# Patient Record
Sex: Female | Born: 1955
Health system: Southern US, Community
[De-identification: ages and names within clinical notes are randomized; demographics above are authoritative.]

## PROBLEM LIST (undated history)

## (undated) DIAGNOSIS — J449 Chronic obstructive pulmonary disease, unspecified: Secondary | ICD-10-CM

## (undated) DIAGNOSIS — F410 Panic disorder [episodic paroxysmal anxiety] without agoraphobia: Secondary | ICD-10-CM

## (undated) DIAGNOSIS — M51369 Other intervertebral disc degeneration, lumbar region without mention of lumbar back pain or lower extremity pain: Secondary | ICD-10-CM

## (undated) DIAGNOSIS — F419 Anxiety disorder, unspecified: Secondary | ICD-10-CM

## (undated) DIAGNOSIS — I10 Essential (primary) hypertension: Secondary | ICD-10-CM

## (undated) DIAGNOSIS — S129XXA Fracture of neck, unspecified, initial encounter: Secondary | ICD-10-CM

## (undated) DIAGNOSIS — M419 Scoliosis, unspecified: Secondary | ICD-10-CM

## (undated) DIAGNOSIS — M7580 Other shoulder lesions, unspecified shoulder: Secondary | ICD-10-CM

## (undated) DIAGNOSIS — M5136 Other intervertebral disc degeneration, lumbar region: Secondary | ICD-10-CM

## (undated) DIAGNOSIS — J45909 Unspecified asthma, uncomplicated: Secondary | ICD-10-CM

## (undated) HISTORY — PX: OTHER SURGICAL HISTORY: SHX169

## (undated) HISTORY — DX: Anxiety disorder, unspecified: F41.9

---

## 2008-11-03 ENCOUNTER — Emergency Department (HOSPITAL_COMMUNITY): Admission: EM | Admit: 2008-11-03 | Discharge: 2008-11-03 | Payer: Self-pay | Admitting: Emergency Medicine

## 2009-02-13 ENCOUNTER — Encounter (INDEPENDENT_AMBULATORY_CARE_PROVIDER_SITE_OTHER): Payer: Self-pay | Admitting: *Deleted

## 2009-02-22 ENCOUNTER — Ambulatory Visit: Payer: Self-pay | Admitting: Infectious Diseases

## 2009-02-22 DIAGNOSIS — R634 Abnormal weight loss: Secondary | ICD-10-CM | POA: Insufficient documentation

## 2009-02-22 DIAGNOSIS — D539 Nutritional anemia, unspecified: Secondary | ICD-10-CM | POA: Insufficient documentation

## 2009-02-22 DIAGNOSIS — R5383 Other fatigue: Secondary | ICD-10-CM | POA: Insufficient documentation

## 2009-02-22 DIAGNOSIS — R5381 Other malaise: Secondary | ICD-10-CM

## 2009-02-22 LAB — CONVERTED CEMR LAB
Basophils Absolute: 0 10*3/uL (ref 0.0–0.1)
Basophils Relative: 0 % (ref 0–1)
Eosinophils Relative: 1 % (ref 0–5)
HCT: 36.7 % (ref 36.0–46.0)
Hemoglobin: 12.4 g/dL (ref 12.0–15.0)
MCHC: 33.8 g/dL (ref 30.0–36.0)
Monocytes Absolute: 0.7 10*3/uL (ref 0.1–1.0)
Monocytes Relative: 10 % (ref 3–12)
Neutro Abs: 3.8 10*3/uL (ref 1.7–7.7)
RBC: 3.34 M/uL — ABNORMAL LOW (ref 3.87–5.11)
Retic Ct Pct: 0.8 % (ref 0.4–3.1)
WBC: 6.5 10*3/uL (ref 4.0–10.5)

## 2009-02-27 ENCOUNTER — Telehealth (INDEPENDENT_AMBULATORY_CARE_PROVIDER_SITE_OTHER): Payer: Self-pay | Admitting: *Deleted

## 2009-12-12 ENCOUNTER — Ambulatory Visit (HOSPITAL_COMMUNITY): Admission: RE | Admit: 2009-12-12 | Discharge: 2009-12-12 | Payer: Self-pay | Admitting: Internal Medicine

## 2010-04-05 ENCOUNTER — Emergency Department (HOSPITAL_COMMUNITY): Admission: EM | Admit: 2010-04-05 | Discharge: 2010-04-05 | Payer: Self-pay | Admitting: Emergency Medicine

## 2010-07-29 ENCOUNTER — Encounter: Payer: Self-pay | Admitting: Internal Medicine

## 2010-07-30 ENCOUNTER — Encounter: Payer: Self-pay | Admitting: Internal Medicine

## 2010-09-20 LAB — POCT I-STAT, CHEM 8
Calcium, Ion: 0.96 mmol/L — ABNORMAL LOW (ref 1.12–1.32)
Creatinine, Ser: 0.6 mg/dL (ref 0.4–1.2)
Hemoglobin: 15.3 g/dL — ABNORMAL HIGH (ref 12.0–15.0)
Potassium: 5.7 mEq/L — ABNORMAL HIGH (ref 3.5–5.1)
Sodium: 135 mEq/L (ref 135–145)

## 2010-09-20 LAB — URINALYSIS, ROUTINE W REFLEX MICROSCOPIC
Bilirubin Urine: NEGATIVE
Glucose, UA: NEGATIVE mg/dL
Ketones, ur: NEGATIVE mg/dL
Urobilinogen, UA: 0.2 mg/dL (ref 0.0–1.0)

## 2010-09-20 LAB — URINE MICROSCOPIC-ADD ON

## 2010-10-17 LAB — URINALYSIS, ROUTINE W REFLEX MICROSCOPIC
Bilirubin Urine: NEGATIVE
Glucose, UA: NEGATIVE mg/dL
Ketones, ur: NEGATIVE mg/dL
Urobilinogen, UA: 0.2 mg/dL (ref 0.0–1.0)

## 2010-10-17 LAB — DIFFERENTIAL
Basophils Absolute: 0 10*3/uL (ref 0.0–0.1)
Basophils Relative: 1 % (ref 0–1)
Eosinophils Absolute: 0 10*3/uL (ref 0.0–0.7)
Eosinophils Relative: 1 % (ref 0–5)
Lymphs Abs: 1.6 10*3/uL (ref 0.7–4.0)
Monocytes Absolute: 0.5 10*3/uL (ref 0.1–1.0)
Monocytes Relative: 6 % (ref 3–12)
Neutro Abs: 6.1 10*3/uL (ref 1.7–7.7)
Neutrophils Relative %: 73 % (ref 43–77)

## 2010-10-17 LAB — BASIC METABOLIC PANEL
BUN: 8 mg/dL (ref 6–23)
GFR calc non Af Amer: >60 mL/min (ref >60)
Glucose, Bld: 145 mg/dL — ABNORMAL HIGH (ref 70–99)
Potassium: 3.9 mEq/L (ref 3.5–5.1)

## 2010-10-17 LAB — MAGNESIUM: Magnesium: 2.1 mg/dL (ref 1.5–2.5)

## 2010-10-17 LAB — URINE MICROSCOPIC-ADD ON

## 2010-10-17 LAB — CBC
Hemoglobin: 13.4 g/dL (ref 12.0–15.0)
MCV: 118.8 fL — ABNORMAL HIGH (ref 78.0–100.0)
RBC: 3.32 MIL/uL — ABNORMAL LOW (ref 3.87–5.11)

## 2011-05-21 ENCOUNTER — Other Ambulatory Visit (HOSPITAL_COMMUNITY): Payer: Self-pay | Admitting: Internal Medicine

## 2011-05-21 DIAGNOSIS — Z139 Encounter for screening, unspecified: Secondary | ICD-10-CM

## 2011-08-12 DIAGNOSIS — G8929 Other chronic pain: Secondary | ICD-10-CM | POA: Diagnosis not present

## 2011-08-12 DIAGNOSIS — F411 Generalized anxiety disorder: Secondary | ICD-10-CM | POA: Diagnosis not present

## 2011-08-12 DIAGNOSIS — IMO0002 Reserved for concepts with insufficient information to code with codable children: Secondary | ICD-10-CM | POA: Diagnosis not present

## 2011-08-12 DIAGNOSIS — S93609A Unspecified sprain of unspecified foot, initial encounter: Secondary | ICD-10-CM | POA: Diagnosis not present

## 2011-09-17 DIAGNOSIS — F411 Generalized anxiety disorder: Secondary | ICD-10-CM | POA: Diagnosis not present

## 2011-09-17 DIAGNOSIS — IMO0002 Reserved for concepts with insufficient information to code with codable children: Secondary | ICD-10-CM | POA: Diagnosis not present

## 2011-09-17 DIAGNOSIS — F329 Major depressive disorder, single episode, unspecified: Secondary | ICD-10-CM | POA: Diagnosis not present

## 2011-09-17 DIAGNOSIS — G8929 Other chronic pain: Secondary | ICD-10-CM | POA: Diagnosis not present

## 2011-09-17 DIAGNOSIS — B373 Candidiasis of vulva and vagina: Secondary | ICD-10-CM | POA: Diagnosis not present

## 2011-10-08 DIAGNOSIS — F411 Generalized anxiety disorder: Secondary | ICD-10-CM | POA: Diagnosis not present

## 2011-10-08 DIAGNOSIS — F329 Major depressive disorder, single episode, unspecified: Secondary | ICD-10-CM | POA: Diagnosis not present

## 2011-10-08 DIAGNOSIS — G8929 Other chronic pain: Secondary | ICD-10-CM | POA: Diagnosis not present

## 2011-10-08 DIAGNOSIS — IMO0002 Reserved for concepts with insufficient information to code with codable children: Secondary | ICD-10-CM | POA: Diagnosis not present

## 2011-10-28 DIAGNOSIS — G8929 Other chronic pain: Secondary | ICD-10-CM | POA: Diagnosis not present

## 2011-10-28 DIAGNOSIS — F411 Generalized anxiety disorder: Secondary | ICD-10-CM | POA: Diagnosis not present

## 2011-10-28 DIAGNOSIS — F329 Major depressive disorder, single episode, unspecified: Secondary | ICD-10-CM | POA: Diagnosis not present

## 2011-10-28 DIAGNOSIS — Z681 Body mass index (BMI) 19 or less, adult: Secondary | ICD-10-CM | POA: Diagnosis not present

## 2011-11-18 DIAGNOSIS — IMO0002 Reserved for concepts with insufficient information to code with codable children: Secondary | ICD-10-CM | POA: Diagnosis not present

## 2011-11-18 DIAGNOSIS — F411 Generalized anxiety disorder: Secondary | ICD-10-CM | POA: Diagnosis not present

## 2011-11-18 DIAGNOSIS — G8929 Other chronic pain: Secondary | ICD-10-CM | POA: Diagnosis not present

## 2011-11-18 DIAGNOSIS — F329 Major depressive disorder, single episode, unspecified: Secondary | ICD-10-CM | POA: Diagnosis not present

## 2011-11-29 DIAGNOSIS — F411 Generalized anxiety disorder: Secondary | ICD-10-CM | POA: Diagnosis not present

## 2011-11-29 DIAGNOSIS — Z681 Body mass index (BMI) 19 or less, adult: Secondary | ICD-10-CM | POA: Diagnosis not present

## 2011-11-29 DIAGNOSIS — F329 Major depressive disorder, single episode, unspecified: Secondary | ICD-10-CM | POA: Diagnosis not present

## 2011-11-29 DIAGNOSIS — G8929 Other chronic pain: Secondary | ICD-10-CM | POA: Diagnosis not present

## 2011-12-10 DIAGNOSIS — F411 Generalized anxiety disorder: Secondary | ICD-10-CM | POA: Diagnosis not present

## 2011-12-10 DIAGNOSIS — G8929 Other chronic pain: Secondary | ICD-10-CM | POA: Diagnosis not present

## 2011-12-10 DIAGNOSIS — F329 Major depressive disorder, single episode, unspecified: Secondary | ICD-10-CM | POA: Diagnosis not present

## 2011-12-10 DIAGNOSIS — Z681 Body mass index (BMI) 19 or less, adult: Secondary | ICD-10-CM | POA: Diagnosis not present

## 2011-12-14 DIAGNOSIS — IMO0002 Reserved for concepts with insufficient information to code with codable children: Secondary | ICD-10-CM | POA: Diagnosis not present

## 2011-12-14 DIAGNOSIS — F411 Generalized anxiety disorder: Secondary | ICD-10-CM | POA: Diagnosis not present

## 2011-12-14 DIAGNOSIS — F329 Major depressive disorder, single episode, unspecified: Secondary | ICD-10-CM | POA: Diagnosis not present

## 2011-12-14 DIAGNOSIS — G8929 Other chronic pain: Secondary | ICD-10-CM | POA: Diagnosis not present

## 2012-01-10 DIAGNOSIS — G8929 Other chronic pain: Secondary | ICD-10-CM | POA: Diagnosis not present

## 2012-01-10 DIAGNOSIS — F411 Generalized anxiety disorder: Secondary | ICD-10-CM | POA: Diagnosis not present

## 2012-01-10 DIAGNOSIS — IMO0002 Reserved for concepts with insufficient information to code with codable children: Secondary | ICD-10-CM | POA: Diagnosis not present

## 2012-01-10 DIAGNOSIS — F329 Major depressive disorder, single episode, unspecified: Secondary | ICD-10-CM | POA: Diagnosis not present

## 2012-02-21 DIAGNOSIS — F411 Generalized anxiety disorder: Secondary | ICD-10-CM | POA: Diagnosis not present

## 2012-02-21 DIAGNOSIS — Z681 Body mass index (BMI) 19 or less, adult: Secondary | ICD-10-CM | POA: Diagnosis not present

## 2012-02-21 DIAGNOSIS — I10 Essential (primary) hypertension: Secondary | ICD-10-CM | POA: Diagnosis not present

## 2012-03-27 DIAGNOSIS — I10 Essential (primary) hypertension: Secondary | ICD-10-CM | POA: Diagnosis not present

## 2012-03-27 DIAGNOSIS — F411 Generalized anxiety disorder: Secondary | ICD-10-CM | POA: Diagnosis not present

## 2012-03-27 DIAGNOSIS — G8929 Other chronic pain: Secondary | ICD-10-CM | POA: Diagnosis not present

## 2012-05-15 DIAGNOSIS — G8929 Other chronic pain: Secondary | ICD-10-CM | POA: Diagnosis not present

## 2012-05-15 DIAGNOSIS — I10 Essential (primary) hypertension: Secondary | ICD-10-CM | POA: Diagnosis not present

## 2012-05-15 DIAGNOSIS — F411 Generalized anxiety disorder: Secondary | ICD-10-CM | POA: Diagnosis not present

## 2012-06-23 ENCOUNTER — Other Ambulatory Visit (HOSPITAL_COMMUNITY): Payer: Self-pay | Admitting: Internal Medicine

## 2012-06-23 DIAGNOSIS — I10 Essential (primary) hypertension: Secondary | ICD-10-CM | POA: Diagnosis not present

## 2012-06-23 DIAGNOSIS — IMO0002 Reserved for concepts with insufficient information to code with codable children: Secondary | ICD-10-CM | POA: Diagnosis not present

## 2012-06-23 DIAGNOSIS — F411 Generalized anxiety disorder: Secondary | ICD-10-CM | POA: Diagnosis not present

## 2012-06-23 DIAGNOSIS — Z0000 Encounter for general adult medical examination without abnormal findings: Secondary | ICD-10-CM

## 2012-06-23 DIAGNOSIS — G8929 Other chronic pain: Secondary | ICD-10-CM | POA: Diagnosis not present

## 2012-06-29 ENCOUNTER — Ambulatory Visit (HOSPITAL_COMMUNITY): Payer: Self-pay

## 2012-07-27 ENCOUNTER — Inpatient Hospital Stay (HOSPITAL_COMMUNITY): Admission: RE | Admit: 2012-07-27 | Payer: Self-pay | Source: Ambulatory Visit

## 2012-08-25 DIAGNOSIS — I10 Essential (primary) hypertension: Secondary | ICD-10-CM | POA: Diagnosis not present

## 2012-08-25 DIAGNOSIS — G8929 Other chronic pain: Secondary | ICD-10-CM | POA: Diagnosis not present

## 2012-08-25 DIAGNOSIS — F411 Generalized anxiety disorder: Secondary | ICD-10-CM | POA: Diagnosis not present

## 2012-08-25 DIAGNOSIS — IMO0002 Reserved for concepts with insufficient information to code with codable children: Secondary | ICD-10-CM | POA: Diagnosis not present

## 2012-10-19 DIAGNOSIS — F411 Generalized anxiety disorder: Secondary | ICD-10-CM | POA: Diagnosis not present

## 2012-10-19 DIAGNOSIS — G8929 Other chronic pain: Secondary | ICD-10-CM | POA: Diagnosis not present

## 2012-10-19 DIAGNOSIS — IMO0002 Reserved for concepts with insufficient information to code with codable children: Secondary | ICD-10-CM | POA: Diagnosis not present

## 2012-12-04 DIAGNOSIS — F411 Generalized anxiety disorder: Secondary | ICD-10-CM | POA: Diagnosis not present

## 2012-12-04 DIAGNOSIS — G8929 Other chronic pain: Secondary | ICD-10-CM | POA: Diagnosis not present

## 2012-12-04 DIAGNOSIS — I10 Essential (primary) hypertension: Secondary | ICD-10-CM | POA: Diagnosis not present

## 2012-12-04 DIAGNOSIS — IMO0002 Reserved for concepts with insufficient information to code with codable children: Secondary | ICD-10-CM | POA: Diagnosis not present

## 2013-01-21 DIAGNOSIS — F411 Generalized anxiety disorder: Secondary | ICD-10-CM | POA: Diagnosis not present

## 2013-01-21 DIAGNOSIS — IMO0002 Reserved for concepts with insufficient information to code with codable children: Secondary | ICD-10-CM | POA: Diagnosis not present

## 2013-01-21 DIAGNOSIS — G8929 Other chronic pain: Secondary | ICD-10-CM | POA: Diagnosis not present

## 2013-01-21 DIAGNOSIS — I10 Essential (primary) hypertension: Secondary | ICD-10-CM | POA: Diagnosis not present

## 2013-03-05 DIAGNOSIS — IMO0002 Reserved for concepts with insufficient information to code with codable children: Secondary | ICD-10-CM | POA: Diagnosis not present

## 2013-03-05 DIAGNOSIS — I10 Essential (primary) hypertension: Secondary | ICD-10-CM | POA: Diagnosis not present

## 2013-03-05 DIAGNOSIS — G8929 Other chronic pain: Secondary | ICD-10-CM | POA: Diagnosis not present

## 2013-03-05 DIAGNOSIS — K5289 Other specified noninfective gastroenteritis and colitis: Secondary | ICD-10-CM | POA: Diagnosis not present

## 2013-05-27 DIAGNOSIS — Z681 Body mass index (BMI) 19 or less, adult: Secondary | ICD-10-CM | POA: Diagnosis not present

## 2013-05-27 DIAGNOSIS — G8929 Other chronic pain: Secondary | ICD-10-CM | POA: Diagnosis not present

## 2013-05-27 DIAGNOSIS — J0100 Acute maxillary sinusitis, unspecified: Secondary | ICD-10-CM | POA: Diagnosis not present

## 2013-05-27 DIAGNOSIS — F411 Generalized anxiety disorder: Secondary | ICD-10-CM | POA: Diagnosis not present

## 2013-08-02 DIAGNOSIS — IMO0002 Reserved for concepts with insufficient information to code with codable children: Secondary | ICD-10-CM | POA: Diagnosis not present

## 2013-08-02 DIAGNOSIS — F411 Generalized anxiety disorder: Secondary | ICD-10-CM | POA: Diagnosis not present

## 2013-08-02 DIAGNOSIS — G8929 Other chronic pain: Secondary | ICD-10-CM | POA: Diagnosis not present

## 2013-10-04 DIAGNOSIS — G8929 Other chronic pain: Secondary | ICD-10-CM | POA: Diagnosis not present

## 2013-10-04 DIAGNOSIS — IMO0002 Reserved for concepts with insufficient information to code with codable children: Secondary | ICD-10-CM | POA: Diagnosis not present

## 2013-11-02 DIAGNOSIS — G8929 Other chronic pain: Secondary | ICD-10-CM | POA: Diagnosis not present

## 2013-11-02 DIAGNOSIS — IMO0002 Reserved for concepts with insufficient information to code with codable children: Secondary | ICD-10-CM | POA: Diagnosis not present

## 2013-11-26 DIAGNOSIS — G8929 Other chronic pain: Secondary | ICD-10-CM | POA: Diagnosis not present

## 2013-11-26 DIAGNOSIS — F411 Generalized anxiety disorder: Secondary | ICD-10-CM | POA: Diagnosis not present

## 2013-11-26 DIAGNOSIS — IMO0002 Reserved for concepts with insufficient information to code with codable children: Secondary | ICD-10-CM | POA: Diagnosis not present

## 2014-01-28 DIAGNOSIS — B373 Candidiasis of vulva and vagina: Secondary | ICD-10-CM | POA: Diagnosis not present

## 2014-01-28 DIAGNOSIS — IMO0002 Reserved for concepts with insufficient information to code with codable children: Secondary | ICD-10-CM | POA: Diagnosis not present

## 2014-01-28 DIAGNOSIS — F411 Generalized anxiety disorder: Secondary | ICD-10-CM | POA: Diagnosis not present

## 2014-01-28 DIAGNOSIS — G8929 Other chronic pain: Secondary | ICD-10-CM | POA: Diagnosis not present

## 2014-01-28 DIAGNOSIS — B3731 Acute candidiasis of vulva and vagina: Secondary | ICD-10-CM | POA: Diagnosis not present

## 2014-04-21 DIAGNOSIS — G894 Chronic pain syndrome: Secondary | ICD-10-CM | POA: Diagnosis not present

## 2014-04-21 DIAGNOSIS — M779 Enthesopathy, unspecified: Secondary | ICD-10-CM | POA: Diagnosis not present

## 2014-04-21 DIAGNOSIS — Z681 Body mass index (BMI) 19 or less, adult: Secondary | ICD-10-CM | POA: Diagnosis not present

## 2014-05-31 DIAGNOSIS — J019 Acute sinusitis, unspecified: Secondary | ICD-10-CM | POA: Diagnosis not present

## 2014-05-31 DIAGNOSIS — G894 Chronic pain syndrome: Secondary | ICD-10-CM | POA: Diagnosis not present

## 2014-05-31 DIAGNOSIS — M62830 Muscle spasm of back: Secondary | ICD-10-CM | POA: Diagnosis not present

## 2014-05-31 DIAGNOSIS — Z681 Body mass index (BMI) 19 or less, adult: Secondary | ICD-10-CM | POA: Diagnosis not present

## 2014-07-14 DIAGNOSIS — K121 Other forms of stomatitis: Secondary | ICD-10-CM | POA: Diagnosis not present

## 2014-07-14 DIAGNOSIS — Z681 Body mass index (BMI) 19 or less, adult: Secondary | ICD-10-CM | POA: Diagnosis not present

## 2014-07-14 DIAGNOSIS — G894 Chronic pain syndrome: Secondary | ICD-10-CM | POA: Diagnosis not present

## 2014-09-12 DIAGNOSIS — Z681 Body mass index (BMI) 19 or less, adult: Secondary | ICD-10-CM | POA: Diagnosis not present

## 2014-09-12 DIAGNOSIS — G894 Chronic pain syndrome: Secondary | ICD-10-CM | POA: Diagnosis not present

## 2014-09-12 DIAGNOSIS — F419 Anxiety disorder, unspecified: Secondary | ICD-10-CM | POA: Diagnosis not present

## 2014-11-17 DIAGNOSIS — G894 Chronic pain syndrome: Secondary | ICD-10-CM | POA: Diagnosis not present

## 2014-11-17 DIAGNOSIS — F419 Anxiety disorder, unspecified: Secondary | ICD-10-CM | POA: Diagnosis not present

## 2014-11-17 DIAGNOSIS — Z681 Body mass index (BMI) 19 or less, adult: Secondary | ICD-10-CM | POA: Diagnosis not present

## 2015-01-20 DIAGNOSIS — Z681 Body mass index (BMI) 19 or less, adult: Secondary | ICD-10-CM | POA: Diagnosis not present

## 2015-01-20 DIAGNOSIS — Z1389 Encounter for screening for other disorder: Secondary | ICD-10-CM | POA: Diagnosis not present

## 2015-01-20 DIAGNOSIS — G894 Chronic pain syndrome: Secondary | ICD-10-CM | POA: Diagnosis not present

## 2015-03-21 DIAGNOSIS — F419 Anxiety disorder, unspecified: Secondary | ICD-10-CM | POA: Diagnosis not present

## 2015-03-21 DIAGNOSIS — G894 Chronic pain syndrome: Secondary | ICD-10-CM | POA: Diagnosis not present

## 2015-03-21 DIAGNOSIS — Z681 Body mass index (BMI) 19 or less, adult: Secondary | ICD-10-CM | POA: Diagnosis not present

## 2015-03-21 DIAGNOSIS — Z1389 Encounter for screening for other disorder: Secondary | ICD-10-CM | POA: Diagnosis not present

## 2015-06-13 DIAGNOSIS — G894 Chronic pain syndrome: Secondary | ICD-10-CM | POA: Diagnosis not present

## 2015-06-13 DIAGNOSIS — Z681 Body mass index (BMI) 19 or less, adult: Secondary | ICD-10-CM | POA: Diagnosis not present

## 2015-06-13 DIAGNOSIS — Z1389 Encounter for screening for other disorder: Secondary | ICD-10-CM | POA: Diagnosis not present

## 2015-06-13 DIAGNOSIS — R634 Abnormal weight loss: Secondary | ICD-10-CM | POA: Diagnosis not present

## 2015-06-13 DIAGNOSIS — I10 Essential (primary) hypertension: Secondary | ICD-10-CM | POA: Diagnosis not present

## 2015-08-15 DIAGNOSIS — Z1389 Encounter for screening for other disorder: Secondary | ICD-10-CM | POA: Diagnosis not present

## 2015-08-15 DIAGNOSIS — Z681 Body mass index (BMI) 19 or less, adult: Secondary | ICD-10-CM | POA: Diagnosis not present

## 2015-08-15 DIAGNOSIS — I10 Essential (primary) hypertension: Secondary | ICD-10-CM | POA: Diagnosis not present

## 2015-08-15 DIAGNOSIS — G894 Chronic pain syndrome: Secondary | ICD-10-CM | POA: Diagnosis not present

## 2015-08-15 DIAGNOSIS — E063 Autoimmune thyroiditis: Secondary | ICD-10-CM | POA: Diagnosis not present

## 2015-08-15 DIAGNOSIS — F419 Anxiety disorder, unspecified: Secondary | ICD-10-CM | POA: Diagnosis not present

## 2015-10-17 DIAGNOSIS — D519 Vitamin B12 deficiency anemia, unspecified: Secondary | ICD-10-CM | POA: Diagnosis not present

## 2015-10-17 DIAGNOSIS — R5383 Other fatigue: Secondary | ICD-10-CM | POA: Diagnosis not present

## 2015-10-17 DIAGNOSIS — Z0001 Encounter for general adult medical examination with abnormal findings: Secondary | ICD-10-CM | POA: Diagnosis not present

## 2015-10-17 DIAGNOSIS — R634 Abnormal weight loss: Secondary | ICD-10-CM | POA: Diagnosis not present

## 2015-10-17 DIAGNOSIS — Z0000 Encounter for general adult medical examination without abnormal findings: Secondary | ICD-10-CM | POA: Diagnosis not present

## 2015-10-17 DIAGNOSIS — Z681 Body mass index (BMI) 19 or less, adult: Secondary | ICD-10-CM | POA: Diagnosis not present

## 2015-10-17 DIAGNOSIS — Z1389 Encounter for screening for other disorder: Secondary | ICD-10-CM | POA: Diagnosis not present

## 2015-10-17 DIAGNOSIS — G894 Chronic pain syndrome: Secondary | ICD-10-CM | POA: Diagnosis not present

## 2015-12-19 DIAGNOSIS — G894 Chronic pain syndrome: Secondary | ICD-10-CM | POA: Diagnosis not present

## 2015-12-19 DIAGNOSIS — Z1389 Encounter for screening for other disorder: Secondary | ICD-10-CM | POA: Diagnosis not present

## 2015-12-19 DIAGNOSIS — N760 Acute vaginitis: Secondary | ICD-10-CM | POA: Diagnosis not present

## 2015-12-19 DIAGNOSIS — F419 Anxiety disorder, unspecified: Secondary | ICD-10-CM | POA: Diagnosis not present

## 2015-12-19 DIAGNOSIS — Z681 Body mass index (BMI) 19 or less, adult: Secondary | ICD-10-CM | POA: Diagnosis not present

## 2016-02-19 DIAGNOSIS — G894 Chronic pain syndrome: Secondary | ICD-10-CM | POA: Diagnosis not present

## 2016-02-19 DIAGNOSIS — Z681 Body mass index (BMI) 19 or less, adult: Secondary | ICD-10-CM | POA: Diagnosis not present

## 2016-02-19 DIAGNOSIS — E441 Mild protein-calorie malnutrition: Secondary | ICD-10-CM | POA: Diagnosis not present

## 2016-02-19 DIAGNOSIS — Z1389 Encounter for screening for other disorder: Secondary | ICD-10-CM | POA: Diagnosis not present

## 2016-02-21 ENCOUNTER — Other Ambulatory Visit: Payer: Self-pay | Admitting: Internal Medicine

## 2016-02-21 DIAGNOSIS — Z1231 Encounter for screening mammogram for malignant neoplasm of breast: Secondary | ICD-10-CM

## 2016-04-23 DIAGNOSIS — G894 Chronic pain syndrome: Secondary | ICD-10-CM | POA: Diagnosis not present

## 2016-04-23 DIAGNOSIS — Z1389 Encounter for screening for other disorder: Secondary | ICD-10-CM | POA: Diagnosis not present

## 2016-04-23 DIAGNOSIS — E063 Autoimmune thyroiditis: Secondary | ICD-10-CM | POA: Diagnosis not present

## 2016-04-23 DIAGNOSIS — Z681 Body mass index (BMI) 19 or less, adult: Secondary | ICD-10-CM | POA: Diagnosis not present

## 2016-04-23 DIAGNOSIS — F419 Anxiety disorder, unspecified: Secondary | ICD-10-CM | POA: Diagnosis not present

## 2016-06-25 DIAGNOSIS — G894 Chronic pain syndrome: Secondary | ICD-10-CM | POA: Diagnosis not present

## 2016-06-25 DIAGNOSIS — Z681 Body mass index (BMI) 19 or less, adult: Secondary | ICD-10-CM | POA: Diagnosis not present

## 2016-06-25 DIAGNOSIS — E063 Autoimmune thyroiditis: Secondary | ICD-10-CM | POA: Diagnosis not present

## 2016-06-25 DIAGNOSIS — E441 Mild protein-calorie malnutrition: Secondary | ICD-10-CM | POA: Diagnosis not present

## 2016-06-25 DIAGNOSIS — F419 Anxiety disorder, unspecified: Secondary | ICD-10-CM | POA: Diagnosis not present

## 2016-06-25 DIAGNOSIS — Z1389 Encounter for screening for other disorder: Secondary | ICD-10-CM | POA: Diagnosis not present

## 2016-09-13 DIAGNOSIS — F419 Anxiety disorder, unspecified: Secondary | ICD-10-CM | POA: Diagnosis not present

## 2016-09-13 DIAGNOSIS — Z681 Body mass index (BMI) 19 or less, adult: Secondary | ICD-10-CM | POA: Diagnosis not present

## 2016-09-13 DIAGNOSIS — G894 Chronic pain syndrome: Secondary | ICD-10-CM | POA: Diagnosis not present

## 2016-11-20 DIAGNOSIS — G894 Chronic pain syndrome: Secondary | ICD-10-CM | POA: Diagnosis not present

## 2016-11-20 DIAGNOSIS — Z681 Body mass index (BMI) 19 or less, adult: Secondary | ICD-10-CM | POA: Diagnosis not present

## 2016-11-20 DIAGNOSIS — F419 Anxiety disorder, unspecified: Secondary | ICD-10-CM | POA: Diagnosis not present

## 2016-11-20 DIAGNOSIS — M47816 Spondylosis without myelopathy or radiculopathy, lumbar region: Secondary | ICD-10-CM | POA: Diagnosis not present

## 2016-11-20 DIAGNOSIS — E441 Mild protein-calorie malnutrition: Secondary | ICD-10-CM | POA: Diagnosis not present

## 2017-03-07 DIAGNOSIS — S20211A Contusion of right front wall of thorax, initial encounter: Secondary | ICD-10-CM | POA: Diagnosis not present

## 2017-03-07 DIAGNOSIS — G894 Chronic pain syndrome: Secondary | ICD-10-CM | POA: Diagnosis not present

## 2017-03-07 DIAGNOSIS — R079 Chest pain, unspecified: Secondary | ICD-10-CM | POA: Diagnosis not present

## 2017-03-07 DIAGNOSIS — Z681 Body mass index (BMI) 19 or less, adult: Secondary | ICD-10-CM | POA: Diagnosis not present

## 2017-03-07 DIAGNOSIS — R64 Cachexia: Secondary | ICD-10-CM | POA: Diagnosis not present

## 2017-05-26 DIAGNOSIS — G894 Chronic pain syndrome: Secondary | ICD-10-CM | POA: Diagnosis not present

## 2017-05-26 DIAGNOSIS — Z681 Body mass index (BMI) 19 or less, adult: Secondary | ICD-10-CM | POA: Diagnosis not present

## 2017-05-26 DIAGNOSIS — F419 Anxiety disorder, unspecified: Secondary | ICD-10-CM | POA: Diagnosis not present

## 2017-05-26 DIAGNOSIS — I10 Essential (primary) hypertension: Secondary | ICD-10-CM | POA: Diagnosis not present

## 2017-05-26 DIAGNOSIS — R64 Cachexia: Secondary | ICD-10-CM | POA: Diagnosis not present

## 2017-05-29 DIAGNOSIS — E748 Other specified disorders of carbohydrate metabolism: Secondary | ICD-10-CM | POA: Diagnosis not present

## 2017-05-29 DIAGNOSIS — R7309 Other abnormal glucose: Secondary | ICD-10-CM | POA: Diagnosis not present

## 2017-09-26 DIAGNOSIS — Z1389 Encounter for screening for other disorder: Secondary | ICD-10-CM | POA: Diagnosis not present

## 2017-09-26 DIAGNOSIS — H9192 Unspecified hearing loss, left ear: Secondary | ICD-10-CM | POA: Diagnosis not present

## 2017-09-26 DIAGNOSIS — F419 Anxiety disorder, unspecified: Secondary | ICD-10-CM | POA: Diagnosis not present

## 2017-09-26 DIAGNOSIS — Z681 Body mass index (BMI) 19 or less, adult: Secondary | ICD-10-CM | POA: Diagnosis not present

## 2017-09-26 DIAGNOSIS — G894 Chronic pain syndrome: Secondary | ICD-10-CM | POA: Diagnosis not present

## 2017-11-24 DIAGNOSIS — I10 Essential (primary) hypertension: Secondary | ICD-10-CM | POA: Diagnosis not present

## 2017-11-24 DIAGNOSIS — E441 Mild protein-calorie malnutrition: Secondary | ICD-10-CM | POA: Diagnosis not present

## 2017-11-24 DIAGNOSIS — G894 Chronic pain syndrome: Secondary | ICD-10-CM | POA: Diagnosis not present

## 2017-11-24 DIAGNOSIS — F419 Anxiety disorder, unspecified: Secondary | ICD-10-CM | POA: Diagnosis not present

## 2017-11-24 DIAGNOSIS — Z1389 Encounter for screening for other disorder: Secondary | ICD-10-CM | POA: Diagnosis not present

## 2017-11-24 DIAGNOSIS — B373 Candidiasis of vulva and vagina: Secondary | ICD-10-CM | POA: Diagnosis not present

## 2017-11-24 DIAGNOSIS — Z681 Body mass index (BMI) 19 or less, adult: Secondary | ICD-10-CM | POA: Diagnosis not present

## 2018-03-30 DIAGNOSIS — Z1389 Encounter for screening for other disorder: Secondary | ICD-10-CM | POA: Diagnosis not present

## 2018-03-30 DIAGNOSIS — G894 Chronic pain syndrome: Secondary | ICD-10-CM | POA: Diagnosis not present

## 2018-03-30 DIAGNOSIS — Z681 Body mass index (BMI) 19 or less, adult: Secondary | ICD-10-CM | POA: Diagnosis not present

## 2018-03-30 DIAGNOSIS — Z0001 Encounter for general adult medical examination with abnormal findings: Secondary | ICD-10-CM | POA: Diagnosis not present

## 2018-06-03 ENCOUNTER — Emergency Department (HOSPITAL_COMMUNITY): Payer: Medicare Other

## 2018-06-03 ENCOUNTER — Other Ambulatory Visit: Payer: Self-pay

## 2018-06-03 ENCOUNTER — Encounter (HOSPITAL_COMMUNITY): Payer: Self-pay

## 2018-06-03 ENCOUNTER — Emergency Department (HOSPITAL_COMMUNITY)
Admission: EM | Admit: 2018-06-03 | Discharge: 2018-06-03 | Disposition: A | Discharge status: Home / Self Care | Payer: Medicare Other | Attending: Emergency Medicine | Admitting: Emergency Medicine

## 2018-06-03 DIAGNOSIS — F17200 Nicotine dependence, unspecified, uncomplicated: Secondary | ICD-10-CM | POA: Insufficient documentation

## 2018-06-03 DIAGNOSIS — G894 Chronic pain syndrome: Secondary | ICD-10-CM | POA: Diagnosis not present

## 2018-06-03 DIAGNOSIS — Y999 Unspecified external cause status: Secondary | ICD-10-CM | POA: Insufficient documentation

## 2018-06-03 DIAGNOSIS — E063 Autoimmune thyroiditis: Secondary | ICD-10-CM | POA: Diagnosis not present

## 2018-06-03 DIAGNOSIS — I10 Essential (primary) hypertension: Secondary | ICD-10-CM | POA: Diagnosis not present

## 2018-06-03 DIAGNOSIS — E639 Nutritional deficiency, unspecified: Secondary | ICD-10-CM | POA: Diagnosis not present

## 2018-06-03 DIAGNOSIS — S299XXA Unspecified injury of thorax, initial encounter: Secondary | ICD-10-CM | POA: Diagnosis not present

## 2018-06-03 DIAGNOSIS — W010XXA Fall on same level from slipping, tripping and stumbling without subsequent striking against object, initial encounter: Secondary | ICD-10-CM | POA: Insufficient documentation

## 2018-06-03 DIAGNOSIS — M545 Low back pain: Secondary | ICD-10-CM | POA: Diagnosis not present

## 2018-06-03 DIAGNOSIS — S32028A Other fracture of second lumbar vertebra, initial encounter for closed fracture: Secondary | ICD-10-CM | POA: Diagnosis not present

## 2018-06-03 DIAGNOSIS — S32010A Wedge compression fracture of first lumbar vertebra, initial encounter for closed fracture: Secondary | ICD-10-CM | POA: Diagnosis not present

## 2018-06-03 DIAGNOSIS — S3992XA Unspecified injury of lower back, initial encounter: Secondary | ICD-10-CM | POA: Diagnosis present

## 2018-06-03 DIAGNOSIS — S32030A Wedge compression fracture of third lumbar vertebra, initial encounter for closed fracture: Secondary | ICD-10-CM | POA: Diagnosis not present

## 2018-06-03 DIAGNOSIS — S99911A Unspecified injury of right ankle, initial encounter: Secondary | ICD-10-CM | POA: Diagnosis not present

## 2018-06-03 DIAGNOSIS — S32038A Other fracture of third lumbar vertebra, initial encounter for closed fracture: Secondary | ICD-10-CM

## 2018-06-03 DIAGNOSIS — Y929 Unspecified place or not applicable: Secondary | ICD-10-CM | POA: Insufficient documentation

## 2018-06-03 DIAGNOSIS — Z681 Body mass index (BMI) 19 or less, adult: Secondary | ICD-10-CM | POA: Diagnosis not present

## 2018-06-03 DIAGNOSIS — Y939 Activity, unspecified: Secondary | ICD-10-CM | POA: Insufficient documentation

## 2018-06-03 DIAGNOSIS — M546 Pain in thoracic spine: Secondary | ICD-10-CM | POA: Diagnosis not present

## 2018-06-03 HISTORY — DX: Other intervertebral disc degeneration, lumbar region without mention of lumbar back pain or lower extremity pain: M51.369

## 2018-06-03 HISTORY — DX: Other shoulder lesions, unspecified shoulder: M75.80

## 2018-06-03 HISTORY — DX: Other intervertebral disc degeneration, lumbar region: M51.36

## 2018-06-03 HISTORY — DX: Scoliosis, unspecified: M41.9

## 2018-06-03 MED ORDER — HYDROCODONE-ACETAMINOPHEN 5-325 MG PO TABS
2.0000 | ORAL_TABLET | Freq: Once | ORAL | Status: AC
  Administered 2018-06-03: 2 via ORAL
  Filled 2018-06-03: qty 2

## 2018-06-03 NOTE — ED Triage Notes (Signed)
Pt reports she tripped and fell on her back a week ago. Pt reports being seen by Dr Gerarda Fraction and was given a shot and told to come to ED for evalation

## 2018-06-03 NOTE — Discharge Instructions (Addendum)
You were evaluated in the Emergency Department and after careful evaluation, we did not find any emergent condition requiring admission or further testing in the hospital.  Your symptoms today seem to be due to superior endplate fractures of L2 and L3 vertebrae.  Your injuries were discussed with neurosurgery, who would like to see you in clinic.  Please call to schedule an appointment.  Please return to the Emergency Department if you experience any worsening of your condition.  We encourage you to follow up with a primary care provider.  Thank you for allowing [place] to be a part of your care.

## 2018-06-03 NOTE — ED Provider Notes (Signed)
[facility] Emergency Department Provider Note MRN:  [id number]  Arrival date & time: 06/03/18     Chief Complaint   Back Pain   History of Present Illness   Raven Lopez is a 62 y.o. year-old female with a history of lumbar DDD, scoliosis presenting to the ED with chief complaint of back pain.  Patient explains that she tripped on her foot and stumbled 4 days ago, falling onto her lower back.  Has had significant lower back pain since that time.  Pain is 9 out of 10 in severity, constant, worse with motion or palpation.  Denies bowel or bladder dysfunction, no numbness or weakness to the arms or legs.  No head trauma, no loss of consciousness, no neck pain.  No chest pain, no abdominal pain.  Review of Systems  A complete 10 system review of systems was obtained and all systems are negative except as noted in the HPI and PMH.   Patient's Health History    Past Medical History:  Diagnosis Date  . AC (acromioclavicular) joint bone spurs   . DDD (degenerative disc disease), lumbar   . Scoliosis     History reviewed. No pertinent surgical history.  No family history on file.  Social History   Socioeconomic History  . Marital status: Married    Spouse name: Not on file  . Number of children: Not on file  . Years of education: Not on file  . Highest education level: Not on file  Occupational History  . Not on file  Social Needs  . Financial resource strain: Not on file  . Food insecurity:    Worry: Not on file    Inability: Not on file  . Transportation needs:    Medical: Not on file    Non-medical: Not on file  Tobacco Use  . Smoking status: Current Every Day Smoker    Packs/day: 0.50  . Smokeless tobacco: Never Used  Substance and Sexual Activity  . Alcohol use: Not Currently  . Drug use: Not Currently  . Sexual activity: Not on file  Lifestyle  . Physical activity:    Days per week: Not on file    Minutes per session: Not on file  . Stress:  Not on file  Relationships  . Social connections:    Talks on phone: Not on file    Gets together: Not on file    Attends religious service: Not on file    Active member of club or organization: Not on file    Attends meetings of clubs or organizations: Not on file    Relationship status: Not on file  . Intimate partner violence:    Fear of current or ex partner: Not on file    Emotionally abused: Not on file    Physically abused: Not on file    Forced sexual activity: Not on file  Other Topics Concern  . Not on file  Social History Narrative  . Not on file     Physical Exam  Vital Signs and Nursing Notes reviewed Vitals:   06/03/18 1616  BP: (!) 158/98  Pulse: 94  Resp: 16  Temp: 98.2 F (36.8 C)  SpO2: 99%    CONSTITUTIONAL: Chronically ill-appearing, frail, NAD NEURO:  Alert and oriented x 3, no focal deficits EYES:  eyes equal and reactive ENT/NECK:  no LAD, no JVD CARDIO: Regular rate, well-perfused, normal S1 and S2 PULM:  CTAB no wheezing or rhonchi GI/GU:  normal bowel  sounds, non-distended, non-tender MSK/SPINE:  No gross deformities, no edema; midline tenderness palpation to the thoracic and lumbar spine; negative straight leg test bilaterally SKIN:  no rash, atraumatic PSYCH:  Appropriate speech and behavior  Diagnostic and Interventional Summary    EKG Interpretation  Date/Time:    Ventricular Rate:    PR Interval:    QRS Duration:   QT Interval:    QTC Calculation:   R Axis:     Text Interpretation:        Labs Reviewed - No data to display  CT Lumbar Spine Wo Contrast  Final Result    CT Thoracic Spine Wo Contrast  Final Result      Medications  HYDROcodone-acetaminophen (NORCO/VICODIN) 5-325 MG per tablet 2 tablet (2 tablets Oral Given 06/03/18 1655)     Procedures Critical Care  ED Course and Medical Decision Making  I have reviewed the triage vital signs and the nursing notes.  Pertinent labs & imaging results that were  available during my care of the patient were reviewed by me and considered in my medical decision making (see below for details).  62 year old female with history of degenerative disc disease of the lumbar spine, scoliosis, likely osteopenia here 4 days after a ground-level fall, significant lower back pain.  No neurological deficits, no bowel or bladder dysfunction, nothing to suggest myelopathy.  Will evaluate with CT to exclude fracture.  CTs reveal L2 and L3 endplate fractures.  Discussed with neurosurgery, nothing to do urgently.  Patient is able to ambulate in the ED without much issue.  Patient requesting discharge.  We will follow-up with Dr. Zada Finders in clinic.  After the discussed management above, the patient was determined to be safe for discharge.  The patient was in agreement with this plan and all questions regarding their care were answered.  ED return precautions were discussed and the patient will return to the ED with any significant worsening of condition.  Barth Kirks. Sedonia Small, [place] [email or web address]  Final Clinical Impressions(s) / ED Diagnoses     ICD-10-CM   1. Other closed fracture of second lumbar vertebra, initial encounter ([place]) S32.028A   2. Other closed fracture of third lumbar vertebra, initial encounter [facility]) S81.103P     ED Discharge Orders    None         Maudie Flakes, MD 06/03/18 [place]

## 2018-06-03 NOTE — ED Notes (Signed)
Patient states she has been taking 2 - 10mg  norco at a time at home for her back pain.

## 2018-06-11 DIAGNOSIS — Z681 Body mass index (BMI) 19 or less, adult: Secondary | ICD-10-CM | POA: Diagnosis not present

## 2018-06-11 DIAGNOSIS — R2681 Unsteadiness on feet: Secondary | ICD-10-CM | POA: Diagnosis not present

## 2018-06-11 DIAGNOSIS — Z1389 Encounter for screening for other disorder: Secondary | ICD-10-CM | POA: Diagnosis not present

## 2018-06-11 DIAGNOSIS — F329 Major depressive disorder, single episode, unspecified: Secondary | ICD-10-CM | POA: Diagnosis not present

## 2018-06-11 DIAGNOSIS — F419 Anxiety disorder, unspecified: Secondary | ICD-10-CM | POA: Diagnosis not present

## 2018-06-11 DIAGNOSIS — E441 Mild protein-calorie malnutrition: Secondary | ICD-10-CM | POA: Diagnosis not present

## 2018-06-11 DIAGNOSIS — E063 Autoimmune thyroiditis: Secondary | ICD-10-CM | POA: Diagnosis not present

## 2018-08-10 DIAGNOSIS — G894 Chronic pain syndrome: Secondary | ICD-10-CM | POA: Diagnosis not present

## 2018-08-10 DIAGNOSIS — I10 Essential (primary) hypertension: Secondary | ICD-10-CM | POA: Diagnosis not present

## 2018-08-10 DIAGNOSIS — E441 Mild protein-calorie malnutrition: Secondary | ICD-10-CM | POA: Diagnosis not present

## 2018-08-10 DIAGNOSIS — I700 Atherosclerosis of aorta: Secondary | ICD-10-CM | POA: Diagnosis not present

## 2018-08-10 DIAGNOSIS — Z681 Body mass index (BMI) 19 or less, adult: Secondary | ICD-10-CM | POA: Diagnosis not present

## 2018-09-23 DIAGNOSIS — Z681 Body mass index (BMI) 19 or less, adult: Secondary | ICD-10-CM | POA: Diagnosis not present

## 2018-09-23 DIAGNOSIS — Z1389 Encounter for screening for other disorder: Secondary | ICD-10-CM | POA: Diagnosis not present

## 2018-09-23 DIAGNOSIS — G894 Chronic pain syndrome: Secondary | ICD-10-CM | POA: Diagnosis not present

## 2018-12-01 DIAGNOSIS — G4709 Other insomnia: Secondary | ICD-10-CM | POA: Diagnosis not present

## 2018-12-01 DIAGNOSIS — Z681 Body mass index (BMI) 19 or less, adult: Secondary | ICD-10-CM | POA: Diagnosis not present

## 2018-12-01 DIAGNOSIS — Z1389 Encounter for screening for other disorder: Secondary | ICD-10-CM | POA: Diagnosis not present

## 2018-12-01 DIAGNOSIS — G894 Chronic pain syndrome: Secondary | ICD-10-CM | POA: Diagnosis not present

## 2019-01-14 DIAGNOSIS — M779 Enthesopathy, unspecified: Secondary | ICD-10-CM | POA: Diagnosis not present

## 2019-01-14 DIAGNOSIS — G894 Chronic pain syndrome: Secondary | ICD-10-CM | POA: Diagnosis not present

## 2019-02-11 DIAGNOSIS — G894 Chronic pain syndrome: Secondary | ICD-10-CM | POA: Diagnosis not present

## 2019-03-12 DIAGNOSIS — G894 Chronic pain syndrome: Secondary | ICD-10-CM | POA: Diagnosis not present

## 2019-04-12 DIAGNOSIS — G894 Chronic pain syndrome: Secondary | ICD-10-CM | POA: Diagnosis not present

## 2019-05-17 DIAGNOSIS — G894 Chronic pain syndrome: Secondary | ICD-10-CM | POA: Diagnosis not present

## 2019-06-16 DIAGNOSIS — G894 Chronic pain syndrome: Secondary | ICD-10-CM | POA: Diagnosis not present

## 2019-07-16 DIAGNOSIS — G894 Chronic pain syndrome: Secondary | ICD-10-CM | POA: Diagnosis not present

## 2019-07-16 DIAGNOSIS — F419 Anxiety disorder, unspecified: Secondary | ICD-10-CM | POA: Diagnosis not present

## 2019-08-17 DIAGNOSIS — G894 Chronic pain syndrome: Secondary | ICD-10-CM | POA: Diagnosis not present

## 2019-09-06 DIAGNOSIS — G894 Chronic pain syndrome: Secondary | ICD-10-CM | POA: Diagnosis not present

## 2019-10-20 DIAGNOSIS — G894 Chronic pain syndrome: Secondary | ICD-10-CM | POA: Diagnosis not present

## 2020-06-12 DIAGNOSIS — Z6820 Body mass index (BMI) 20.0-20.9, adult: Secondary | ICD-10-CM | POA: Diagnosis not present

## 2020-06-12 DIAGNOSIS — F419 Anxiety disorder, unspecified: Secondary | ICD-10-CM | POA: Diagnosis not present

## 2020-06-12 DIAGNOSIS — Z1389 Encounter for screening for other disorder: Secondary | ICD-10-CM | POA: Diagnosis not present

## 2020-06-12 DIAGNOSIS — Z0001 Encounter for general adult medical examination with abnormal findings: Secondary | ICD-10-CM | POA: Diagnosis not present

## 2020-07-10 IMAGING — CT CT T SPINE W/O CM
3 of 4 series · 9 of 33 positions shown, 10 images · non-contrast
Comparison: Abdominopelvic CT 12/12/2009.

CLINICAL DATA: Patient tripped and fell on her back 1 week ago.
Back pain.

EXAM:
CT THORACIC AND LUMBAR SPINE WITHOUT CONTRAST
TECHNIQUE: Multidetector CT imaging of the thoracic and lumbar spine was
performed without contrast. Multiplanar CT image reconstructions
were also generated.

[Series 4: t spine soft · axial · 0.27mm/px · z∈[-172,-172]mm · 1 of 133 slices shown, 2 images]
[im 67/133  soft-tissue]
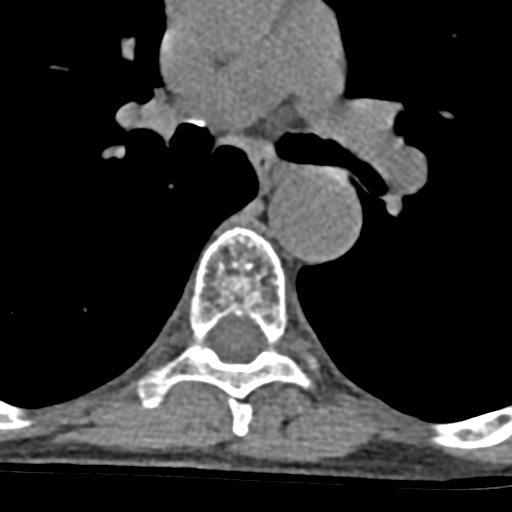
[im 67/133  bone]
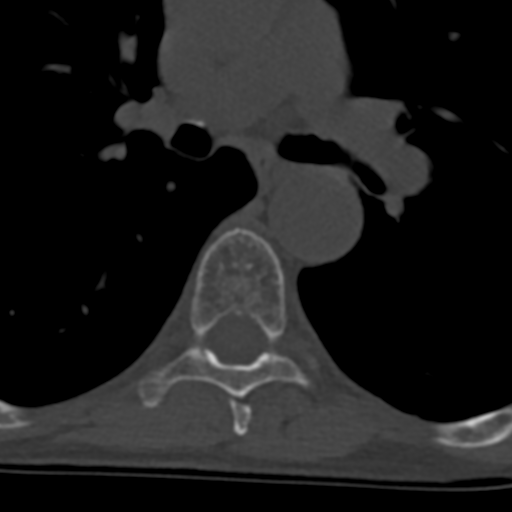

[Series 5: sagittal bone · sagittal · 0.27mm/px · 5 of 61 slices shown]
[im 21/61  bone]
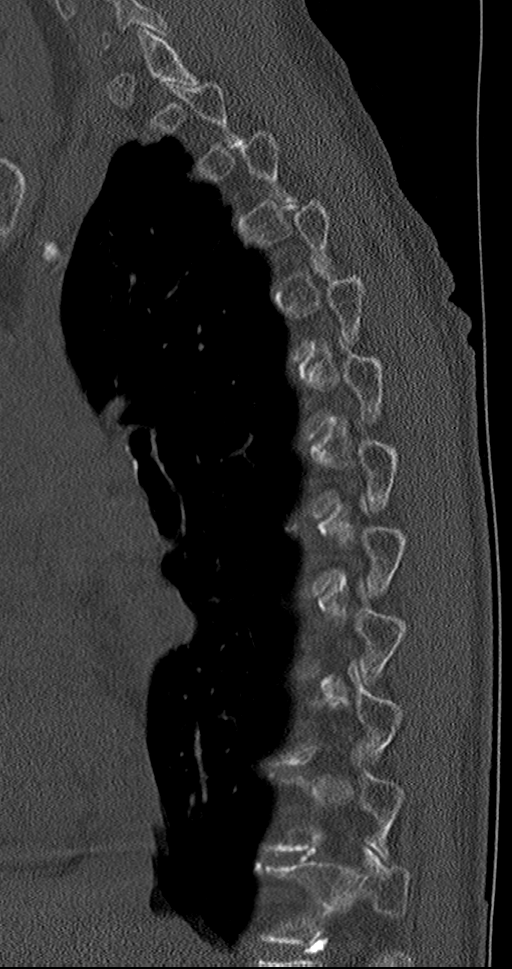
[im 26/61  bone]
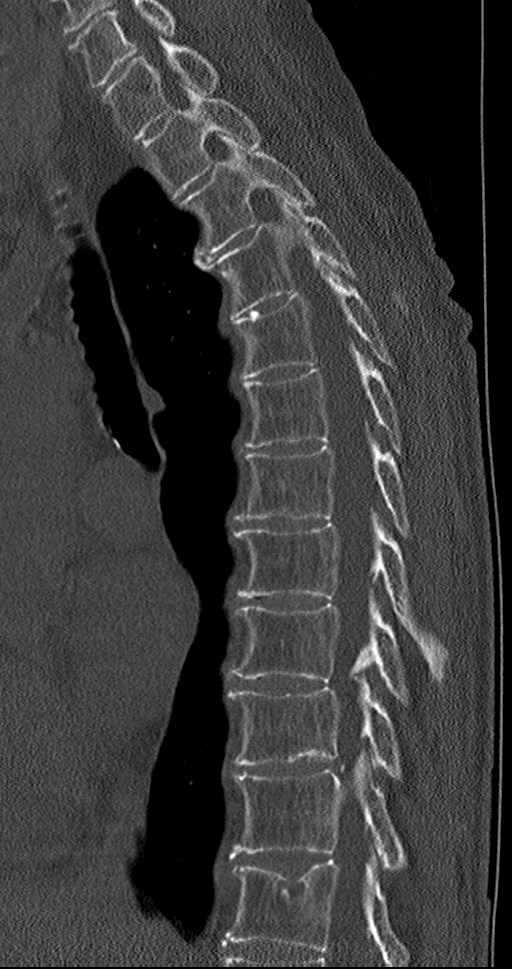
[im 31/61  bone]
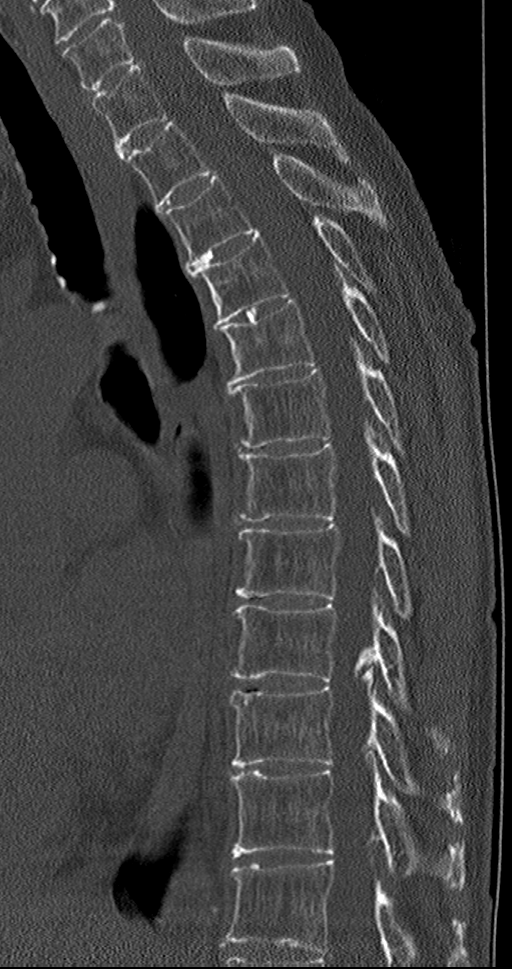
[im 36/61  bone]
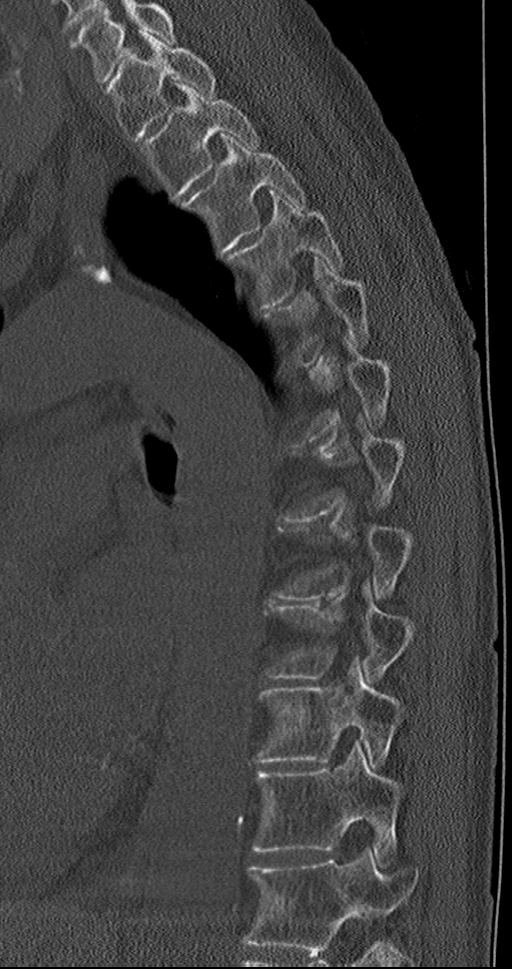
[im 41/61  bone]
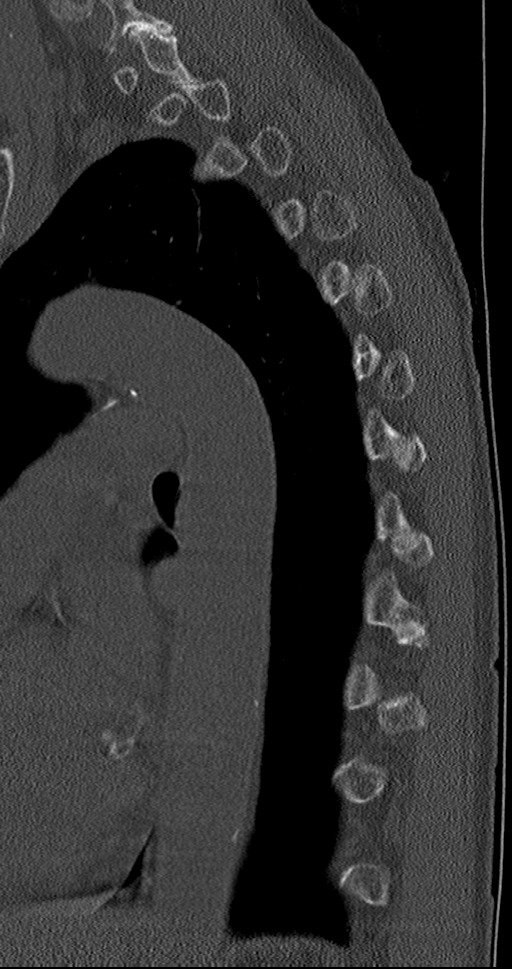

[Series 6: coronal bone · coronal · 0.22mm/px · 3 of 67 slices shown]
[im 14/67  bone]
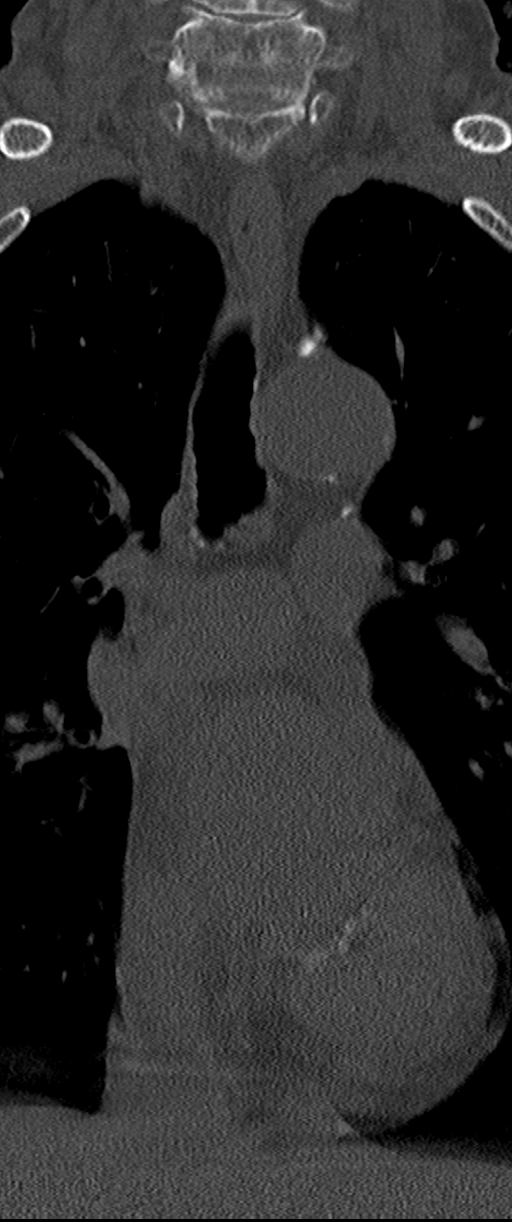
[im 27/67  bone]
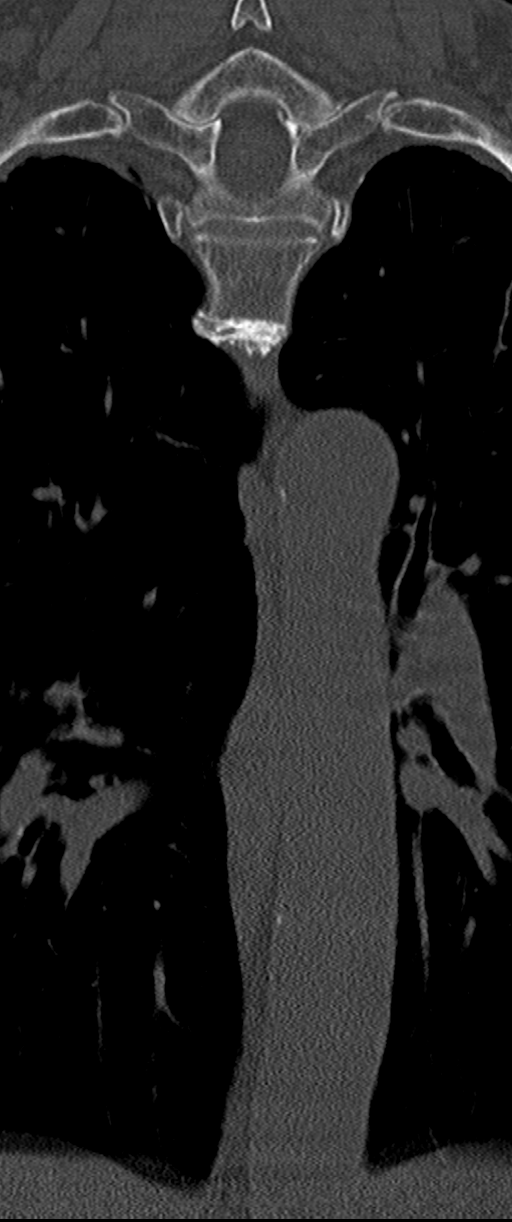
[im 40/67  bone]
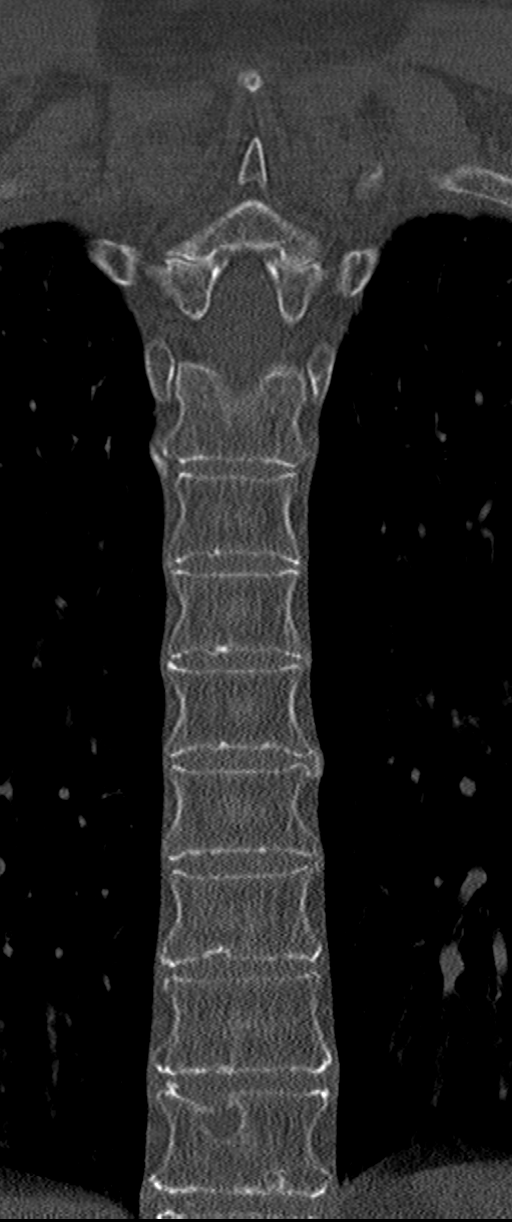

[9 of 33 positions shown; findings below may reference images not displayed]

FINDINGS: CT THORACIC SPINE FINDINGS

Alignment: Normal.

Vertebrae: There are 12 rib-bearing thoracic type vertebral bodies.
There is no evidence of acute thoracic spine fracture. There is a
Schmorl's node involving the superior endplate of T12.

Paraspinal and other soft tissues: Unremarkable. There is
atherosclerosis of the aorta, great vessels and coronary arteries.

Disc levels: Mild thoracic spine degenerative changes. There is a
partially calcified disc protrusion in the right subarticular zone
at T10-11. No large disc herniation or high-grade spinal stenosis
demonstrated.

CT LUMBAR SPINE FINDINGS

Segmentation: Normal.

Alignment: Normal.

Vertebrae: There are acute fractures involving the superior
endplates of the L2 and L3 vertebral bodies. Both fractures are
associated with less than 20% loss of vertebral body height. There
is 5 mm of osseous retropulsion at L3. The posterior elements are
intact.

Paraspinal and other soft tissues: There are mild paraspinal
inflammatory changes at the L2 and L3 fractures. No significant
hematoma or epidural fluid collection. There is prominent aortoiliac
atherosclerosis. Extrahepatic biliary dilatation is noted, similar
to previous CT. There is a small cyst in the lower pole of the right
kidney.

Disc levels: There is mild lumbar spondylosis. At L3-4, there is
mild disc bulging and facet hypertrophy, but no spinal stenosis or
nerve root encroachment. Disc height is maintained at L4-5 and
L5-S1. No nerve root encroachment identified.
IMPRESSION: CT THORACIC SPINE IMPRESSION

1. No acute findings.
2. Disc protrusion in the subarticular zone at T10-11.

CT LUMBAR SPINE IMPRESSION

1. Acute fractures involving the superior endplates of L2 and L3.
Both fractures are associated with less than 20% loss vertebral body
height and there is no involvement of the posterior elements. There
is mild osseous retropulsion at L3.
2. Mild paraspinous edema without focal hematoma.
3. No lumbar disc herniation, spinal stenosis or nerve root
encroachment.
4.  Aortic Atherosclerosis ([id number]).

## 2020-08-16 DIAGNOSIS — Z1389 Encounter for screening for other disorder: Secondary | ICD-10-CM | POA: Diagnosis not present

## 2020-08-16 DIAGNOSIS — R051 Acute cough: Secondary | ICD-10-CM | POA: Diagnosis not present

## 2020-08-16 DIAGNOSIS — Z6820 Body mass index (BMI) 20.0-20.9, adult: Secondary | ICD-10-CM | POA: Diagnosis not present

## 2021-09-10 DIAGNOSIS — Z6821 Body mass index (BMI) 21.0-21.9, adult: Secondary | ICD-10-CM | POA: Diagnosis not present

## 2021-09-10 DIAGNOSIS — I10 Essential (primary) hypertension: Secondary | ICD-10-CM | POA: Diagnosis not present

## 2021-12-14 DIAGNOSIS — E538 Deficiency of other specified B group vitamins: Secondary | ICD-10-CM | POA: Diagnosis not present

## 2021-12-14 DIAGNOSIS — I10 Essential (primary) hypertension: Secondary | ICD-10-CM | POA: Diagnosis not present

## 2021-12-14 DIAGNOSIS — Z681 Body mass index (BMI) 19 or less, adult: Secondary | ICD-10-CM | POA: Diagnosis not present

## 2021-12-14 DIAGNOSIS — Z9229 Personal history of other drug therapy: Secondary | ICD-10-CM | POA: Diagnosis not present

## 2021-12-14 DIAGNOSIS — E559 Vitamin D deficiency, unspecified: Secondary | ICD-10-CM | POA: Diagnosis not present

## 2022-02-15 ENCOUNTER — Encounter: Payer: Self-pay | Admitting: Emergency Medicine

## 2022-02-15 ENCOUNTER — Ambulatory Visit
Admission: EM | Admit: 2022-02-15 | Discharge: 2022-02-15 | Disposition: A | Discharge status: Home / Self Care | Payer: Medicare Other | Attending: Urgent Care | Admitting: Urgent Care

## 2022-02-15 DIAGNOSIS — R52 Pain, unspecified: Secondary | ICD-10-CM | POA: Diagnosis not present

## 2022-02-15 DIAGNOSIS — R319 Hematuria, unspecified: Secondary | ICD-10-CM

## 2022-02-15 DIAGNOSIS — E860 Dehydration: Secondary | ICD-10-CM | POA: Insufficient documentation

## 2022-02-15 DIAGNOSIS — R350 Frequency of micturition: Secondary | ICD-10-CM | POA: Diagnosis not present

## 2022-02-15 DIAGNOSIS — R300 Dysuria: Secondary | ICD-10-CM | POA: Diagnosis not present

## 2022-02-15 LAB — POCT URINALYSIS DIP (MANUAL ENTRY)
Bilirubin, UA: NEGATIVE
Glucose, UA: NEGATIVE mg/dL
Ketones, POC UA: NEGATIVE mg/dL
Nitrite, UA: NEGATIVE
Protein Ur, POC: >=300 mg/dL — AB
Spec Grav, UA: 1.02 (ref 1.010–1.025)
Urobilinogen, UA: 0.2 E.U./dL
pH, UA: 7 (ref 5.0–8.0)

## 2022-02-15 NOTE — ED Provider Notes (Signed)
[name]   MRN: [id number] DOB: 02-15-1956  Subjective:   Raven Lopez is a 66 y.o. female presenting for 2 week history of acute onset intermittent dysuria, urinary frequency, hematuria, body aches. Has a history of hematuria as seen in chart review.  Patient has never had to see an urologist for this.  Admits that she does not drink water.  She drinks 4 to 5 cups of coffee per day.  Does not drink alcohol.  Has hypertension but is not taking her blood pressure medications as prescribed.  She has a follow-up appointment with her PCP coming up this next week.  No current facility-administered medications for this encounter.  Current Outpatient Medications:    acetaminophen (TYLENOL) 500 MG tablet, Take 1,000 mg by mouth every 4 (four) hours as needed for mild pain or moderate pain., Disp: , Rfl:    amLODipine (NORVASC) 2.5 MG tablet, Take 2.5 mg by mouth daily., Disp: , Rfl:    diazepam (VALIUM) 10 MG tablet, Take 10 mg by mouth 4 (four) times daily as needed for anxiety., Disp: , Rfl:    HYDROcodone-acetaminophen (NORCO) 10-325 MG tablet, Take 1-2 tablets by mouth 4 (four) times daily as needed for moderate pain., Disp: , Rfl:    Allergies  Allergen Reactions   Penicillins Shortness Of Breath and Swelling    Has patient had a PCN reaction causing immediate rash, facial/tongue/throat swelling, SOB or lightheadedness with hypotension: Yes Has patient had a PCN reaction causing severe rash involving mucus membranes or skin necrosis: No Has patient had a PCN reaction that required hospitalization: Yes Has patient had a PCN reaction occurring within the last 10 years: No If all of the above answers are "NO", then may proceed with Cephalosporin use.    Aspirin     REACTION: dizzy,weak and upset stomach   Demerol [Meperidine Hcl] Other (See Comments)    Altered mental status-anger, hallucinations   Sulfonamide Derivatives Nausea And Vomiting    Past Medical History:   Diagnosis Date   AC (acromioclavicular) joint bone spurs    DDD (degenerative disc disease), lumbar    Scoliosis      History reviewed. No pertinent surgical history.  History reviewed. No pertinent family history.  Social History   Tobacco Use   Smoking status: Every Day    Packs/day: 0.50    Types: Cigarettes   Smokeless tobacco: Never  Substance Use Topics   Alcohol use: Not Currently   Drug use: Not Currently    ROS   Objective:   Vitals: BP (!) 181/83 (BP Location: Right Arm)   Pulse 79   Temp 98.6 F (37 C) (Oral)   Resp 18   SpO2 98%   Physical Exam Constitutional:      General: She is not in acute distress.    Appearance: Normal appearance. She is well-developed. She is not ill-appearing, toxic-appearing or diaphoretic.  HENT:     Head: Normocephalic and atraumatic.     Nose: Nose normal.     Mouth/Throat:     Mouth: Mucous membranes are moist.  Eyes:     General: No scleral icterus.       Right eye: No discharge.        Left eye: No discharge.     Extraocular Movements: Extraocular movements intact.     Conjunctiva/sclera: Conjunctivae normal.  Cardiovascular:     Rate and Rhythm: Normal rate.  Pulmonary:     Effort: Pulmonary effort is normal.  Abdominal:  General: Bowel sounds are normal. There is no distension.     Palpations: Abdomen is soft. There is no mass.     Tenderness: There is no abdominal tenderness. There is no right CVA tenderness, left CVA tenderness, guarding or rebound.  Skin:    General: Skin is warm and dry.  Neurological:     General: No focal deficit present.     Mental Status: She is alert and oriented to person, place, and time.  Psychiatric:        Mood and Affect: Mood normal.        Behavior: Behavior normal.        Thought Content: Thought content normal.        Judgment: Judgment normal.    Results for orders placed or performed during the hospital encounter of 02/15/22 (from the past 24 hour(s))  POCT  urinalysis dipstick     Status: Abnormal   Collection Time: 02/15/22  1:25 PM  Result Value Ref Range   Color, UA other (A) yellow   Clarity, UA clear clear   Glucose, UA negative negative mg/dL   Bilirubin, UA negative negative   Ketones, POC UA negative negative mg/dL   Spec Grav, UA 1.020 1.010 - 1.025   Blood, UA large (A) negative   pH, UA 7.0 5.0 - 8.0   Protein Ur, POC >=300 (A) negative mg/dL   Urobilinogen, UA 0.2 0.2 or 1.0 E.U./dL   Nitrite, UA Negative Negative   Leukocytes, UA Trace (A) Negative    Assessment and Plan :   PDMP not reviewed this encounter.  1. Urinary frequency   2. Dysuria   3. Hematuria, unspecified type   4. Body aches   5. Dehydration    Offered medication for dysuria but patient declined as it is very intermittent.  Recommended she start hydrating much better and avoid drinking as much coffee as she is.  I advised that she try switching to decaf.  Urine culture pending.  We will treat as appropriate based off of the lab results.  Keep follow-up appointment with the PCP.   Jaynee Eagles, PA-C 02/15/22 1338

## 2022-02-15 NOTE — Discharge Instructions (Addendum)
Make sure you hydrate very well with plain water and a quantity of 32-40 ounces of water a day.  Please limit drinks that are considered urinary irritants such as soda, sweet tea, coffee, energy drinks, alcohol.  These can worsen your urinary and genital symptoms but also be the source of them.  I will let you know about your urine culture results through MyChart to see if we need to prescribe or change your antibiotics based off of those results.   Switch to decaf coffee and limit it to no more than 16 ounces.

## 2022-02-15 NOTE — ED Triage Notes (Signed)
Patient c/o hematuria, body aches, some dysuria x 2 weeks.  Patient has taken Tylenol for pain.

## 2022-02-17 LAB — URINE CULTURE

## 2022-02-19 ENCOUNTER — Encounter (HOSPITAL_COMMUNITY): Payer: Self-pay

## 2022-02-19 ENCOUNTER — Other Ambulatory Visit: Payer: Self-pay

## 2022-02-19 ENCOUNTER — Emergency Department (HOSPITAL_COMMUNITY)
Admission: EM | Admit: 2022-02-19 | Discharge: 2022-02-19 | Disposition: A | Discharge status: Home / Self Care | Payer: Medicare Other | Attending: Emergency Medicine | Admitting: Emergency Medicine

## 2022-02-19 DIAGNOSIS — R3915 Urgency of urination: Secondary | ICD-10-CM | POA: Diagnosis not present

## 2022-02-19 DIAGNOSIS — M545 Low back pain, unspecified: Secondary | ICD-10-CM | POA: Insufficient documentation

## 2022-02-19 DIAGNOSIS — Z79899 Other long term (current) drug therapy: Secondary | ICD-10-CM | POA: Insufficient documentation

## 2022-02-19 DIAGNOSIS — R319 Hematuria, unspecified: Secondary | ICD-10-CM | POA: Diagnosis not present

## 2022-02-19 DIAGNOSIS — R944 Abnormal results of kidney function studies: Secondary | ICD-10-CM | POA: Diagnosis not present

## 2022-02-19 LAB — COMPREHENSIVE METABOLIC PANEL
ALT: 8 U/L (ref 0–44)
AST: 16 U/L (ref 15–41)
Albumin: 3.9 g/dL (ref 3.5–5.0)
Alkaline Phosphatase: 62 U/L (ref 38–126)
Anion gap: 8 (ref 5–15)
BUN: 21 mg/dL (ref 8–23)
CO2: 25 mmol/L (ref 22–32)
Calcium: 9.3 mg/dL (ref 8.9–10.3)
Chloride: 106 mmol/L (ref 98–111)
Creatinine, Ser: 1.01 mg/dL — ABNORMAL HIGH (ref 0.44–1.00)
GFR, Estimated: >60 mL/min (ref >60)
Glucose, Bld: 99 mg/dL (ref 70–99)
Potassium: 4.1 mmol/L (ref 3.5–5.1)
Sodium: 139 mmol/L (ref 135–145)
Total Bilirubin: 0.4 mg/dL (ref 0.3–1.2)
Total Protein: 7.3 g/dL (ref 6.5–8.1)

## 2022-02-19 LAB — URINALYSIS, ROUTINE W REFLEX MICROSCOPIC
Bilirubin Urine: NEGATIVE
Glucose, UA: NEGATIVE mg/dL
Ketones, ur: NEGATIVE mg/dL
Leukocytes,Ua: NEGATIVE
Nitrite: NEGATIVE
Protein, ur: 100 mg/dL — AB
RBC / HPF: >50 RBC/hpf — ABNORMAL HIGH (ref 0–5)
Specific Gravity, Urine: 1.009 (ref 1.005–1.030)
pH: 6 (ref 5.0–8.0)

## 2022-02-19 LAB — CBC
HCT: 43.5 % (ref 36.0–46.0)
Hemoglobin: 14.3 g/dL (ref 12.0–15.0)
MCH: 30.1 pg (ref 26.0–34.0)
MCHC: 32.9 g/dL (ref 30.0–36.0)
MCV: 91.6 fL (ref 80.0–100.0)
Platelets: 241 10*3/uL (ref 150–400)
RBC: 4.75 MIL/uL (ref 3.87–5.11)
RDW: 14.5 % (ref 11.5–15.5)
WBC: 7.9 10*3/uL (ref 4.0–10.5)
nRBC: 0 % (ref 0.0–0.2)

## 2022-02-19 LAB — LIPASE, BLOOD: Lipase: 29 U/L (ref 11–51)

## 2022-02-19 NOTE — Discharge Instructions (Addendum)
It was a pleasure taking care of you today!   Your labs were unremarkable. Your urine did show blood in it. Attached is the information for the on-call urologist, call and set up a follow up appointment regarding todays ED visit. Ensure to maintain fluid intake with water, pedialyte, gatorade. You may follow up with your primary care provider regarding todays ED visit. Return to the ED if you are experiencing increasing/worsening back pain, blood in urine, or worsening symptoms.

## 2022-02-19 NOTE — ED Provider Notes (Signed)
[facility] EMERGENCY DEPARTMENT Provider Note   CSN: [id number] Arrival date & time: 02/19/22  1319     History  Chief Complaint  Patient presents with   Back Pain    Raven Lopez is a 66 y.o. female who presents to the emergency department with concerns for worsening back pain over the past week.  Patient has associated intermittent hematuria over the past 2 weeks, urgency.  No meds tried prior to arrival.  Denies fever, dysuria, frequency, abdominal pain, nausea, vomiting.     The history is provided by the patient. No language interpreter was used.  Back Pain Associated symptoms: no abdominal pain, no dysuria and no fever        Home Medications Prior to Admission medications   Medication Sig Start Date End Date Taking? Authorizing Provider  acetaminophen (TYLENOL) 500 MG tablet Take 1,000 mg by mouth every 4 (four) hours as needed for mild pain or moderate pain.    [provider]  amLODipine (NORVASC) 2.5 MG tablet Take 2.5 mg by mouth daily.    [provider]  diazepam (VALIUM) 10 MG tablet Take 10 mg by mouth 4 (four) times daily as needed for anxiety.    [provider]  HYDROcodone-acetaminophen (NORCO) 10-325 MG tablet Take 1-2 tablets by mouth 4 (four) times daily as needed for moderate pain.    [provider]      Allergies    Penicillins, Aspirin, Demerol [meperidine hcl], and Sulfonamide derivatives    Review of Systems   Review of Systems  Constitutional:  Negative for fever.  Gastrointestinal:  Negative for abdominal pain, nausea and vomiting.  Genitourinary:  Positive for hematuria (x 2 weeks) and urgency. Negative for dysuria and frequency.  Musculoskeletal:  Positive for back pain.  All other systems reviewed and are negative.   Physical Exam Updated Vital Signs BP 128/89   Pulse 68   Temp 98.6 F (37 C) (Oral)   Resp 17   Ht '5\' 2"'$  (1.575 m)   SpO2 99%   BMI 14.63 kg/m  Physical Exam Vitals and  nursing note reviewed.  Constitutional:      General: She is not in acute distress.    Appearance: She is not diaphoretic.  HENT:     Head: Normocephalic and atraumatic.     Mouth/Throat:     Pharynx: No oropharyngeal exudate.  Eyes:     General: No scleral icterus.    Conjunctiva/sclera: Conjunctivae normal.  Cardiovascular:     Rate and Rhythm: Normal rate and regular rhythm.     Pulses: Normal pulses.     Heart sounds: Normal heart sounds.  Pulmonary:     Effort: Pulmonary effort is normal. No respiratory distress.     Breath sounds: Normal breath sounds. No wheezing.  Abdominal:     General: Bowel sounds are normal.     Palpations: Abdomen is soft. There is no mass.     Tenderness: There is no abdominal tenderness. There is no right CVA tenderness, left CVA tenderness, guarding or rebound.  Musculoskeletal:        General: Normal range of motion.     Cervical back: Normal range of motion and neck supple.  Skin:    General: Skin is warm and dry.  Neurological:     Mental Status: She is alert.  Psychiatric:        Behavior: Behavior normal.     ED Results / Procedures / Treatments   Labs (all  labs ordered are listed, but only abnormal results are displayed) Labs Reviewed  COMPREHENSIVE METABOLIC PANEL - Abnormal; Notable for the following components:      Result Value   Creatinine, Ser 1.01 (*)    All other components within normal limits  URINALYSIS, ROUTINE W REFLEX MICROSCOPIC - Abnormal; Notable for the following components:   Color, Urine AMBER (*)    APPearance HAZY (*)    Hgb urine dipstick MODERATE (*)    Protein, ur 100 (*)    RBC / HPF >50 (*)    Bacteria, UA RARE (*)    All other components within normal limits  CBC  LIPASE, BLOOD    EKG None  Radiology No results found.  Procedures Procedures    Medications Ordered in ED Medications - No data to display  ED Course/ Medical Decision Making/ A&P Clinical Course as of 02/19/22 2302  Tue  Feb 19, 2022  1900 Offered patient CT scan as well as vaginal exam due to patient having concerns of where the bleeding is coming from in her urine in the emergency department, patient declines at this time and notes that she wants to go home.  Discussed with patient discharge treatment plan.  Patient agreeable this time.  Patient for safe discharge at this time. [SB]    Clinical Course User Index [SB] Makynli Stills A, PA-C                           Medical Decision Making Amount and/or Complexity of Data Reviewed Labs: ordered.   Pt presents with concerns for worsening back pain over the past week.  Patient has associated intermittent hematuria over the past 2 weeks as well as urgency.  No meds tried prior to arrival.  Patient afebrile.  On exam patient without acute cardiovascular, respiratory, abdominal exam findings.  No CVA tenderness to palpation noted on exam.  Differential diagnosis includes nephrolithiasis, acute cystitis, pyelonephritis.    Additional history obtained:  Additional history obtained from family at bedside External records from outside source obtained and reviewed including: Patient was evaluated in urgent care on 02/15/2022 for urinary frequency.  At that time patient noted that she was not consuming water as she should.  At that time patient was offered medication for dysuria however patient declined at that time.  Labs:  I ordered, and personally interpreted labs.  The pertinent results include:   Urinalysis notable for a moderate amount of hemoglobin otherwise nitrate and leukocyte negative. Lipase at 29 unremarkable. CMP with slightly elevated creatinine at 1.01 otherwise unremarkable. CBC unremarkable   Disposition: Presentation suspicious for acute low back pain, hematuria as well as urgency.  Doubt concern at this time for a UTI.  Doubt pyelonephritis or nephrolithiasis at this time however unable to fully rule those out without a CT scan.  Patient offered  treatment with IV fluids as well as CT scan as well as vaginal exam due to patient have concerns of where the bleeding may be coming from.  Patient declined at this time noted that she would like to go home.  After consideration of the diagnostic results and the patients response to treatment, I feel that the patient would benefit from Discharge home. Supportive care measures and strict return precautions discussed with patient at bedside. Pt acknowledges and verbalizes understanding. Pt appears safe for discharge. Follow up as indicated in discharge paperwork.    This chart was dictated using voice recognition software, Dragon.  Despite the best efforts of this provider to proofread and correct errors, errors may still occur which can change documentation meaning.  Final Clinical Impression(s) / ED Diagnoses Final diagnoses:  Acute low back pain without sciatica, unspecified back pain laterality  Hematuria, unspecified type  Urgency of urination    Rx / DC Orders ED Discharge Orders     None         Marla Pouliot A, PA-C 02/19/22 2305    Milton Ferguson, MD 02/20/22 1345

## 2022-02-19 NOTE — ED Triage Notes (Signed)
Patient complaining of worsening back pain over the past week. Family states that she has blood in urine and urine is dark in color. Pain mostly to left side.

## 2022-02-21 ENCOUNTER — Ambulatory Visit: Payer: Medicare Other | Admitting: Adult Health

## 2022-02-21 ENCOUNTER — Other Ambulatory Visit (HOSPITAL_COMMUNITY)
Admission: RE | Admit: 2022-02-21 | Discharge: 2022-02-21 | Disposition: A | Discharge status: Home / Self Care | Payer: Medicare Other | Source: Ambulatory Visit | Attending: Adult Health | Admitting: Adult Health

## 2022-02-21 ENCOUNTER — Encounter: Payer: Self-pay | Admitting: Adult Health

## 2022-02-21 VITALS — BP 97/74 | HR 65 | Ht 62.0 in | Wt 94.0 lb

## 2022-02-21 DIAGNOSIS — Z1151 Encounter for screening for human papillomavirus (HPV): Secondary | ICD-10-CM | POA: Diagnosis not present

## 2022-02-21 DIAGNOSIS — Z124 Encounter for screening for malignant neoplasm of cervix: Secondary | ICD-10-CM | POA: Insufficient documentation

## 2022-02-21 DIAGNOSIS — N950 Postmenopausal bleeding: Secondary | ICD-10-CM | POA: Insufficient documentation

## 2022-02-21 NOTE — Progress Notes (Signed)
  Subjective:     Patient ID: Raven Lopez, female   DOB: 02-17-1956, 66 y.o.   MRN: [id number]  HPI Raven Lopez is a 66 year old white female, widowed, PM, in for vaginal bleeding started in July, is pink. Was seen in ER 02/19/22. Urine was amber and hazy she thought was dehydrated but did not get IV fluids. Had labs. Her son Raven Lopez is with her, and  Raven Lopez too.  PCP is Dr Gerarda Fraction.   Review of Systems +vaginal bleeding started in July, pink Feels weak Reviewed past medical,surgical, social and family history. Reviewed medications and allergies.     Objective:   Physical Exam BP 97/74 (BP Location: Left Arm, Patient Position: Sitting, Cuff Size: Normal)   Pulse 65   Ht '5\' 2"'$  (1.575 m)   Wt 94 lb (42.6 kg)   BMI 17.19 kg/m     Skin warm and dry, frail, in wheelchair. Lungs: clear to ausculation bilaterally. Cardiovascular: regular rate and rhythm.  Pelvic: external genitalia is normal in appearance no lesions, vagina: pale with pinkish discharge,urethra has no lesions or masses noted, cervix:atrophic, pap with HR HPV genotyping performed, uterus: normal size, shape and contour, non tender, no masses felt, adnexa: no masses or tenderness noted. Bladder is non tender and no masses felt.  AA is 1 Fall risk is low    02/21/2022    9:49 AM  Depression screen PHQ 2/9  Decreased Interest 1  Down, Depressed, Hopeless 1  PHQ - 2 Score 2  Altered sleeping 1  Tired, decreased energy 3  Change in appetite 2  Feeling bad or failure about yourself  0  Trouble concentrating 2  Moving slowly or fidgety/restless 2  Suicidal thoughts 0  PHQ-9 Score 12       02/21/2022    9:49 AM  GAD 7 : Generalized Anxiety Score  Nervous, Anxious, on Edge 2  Control/stop worrying 3  Worry too much - different things 3  Trouble relaxing 2  Restless 2  Easily annoyed or irritable 2  Afraid - awful might happen 2  Total GAD 7 Score 16  Takes valium  Upstream - 02/21/22 0959       Pregnancy Intention  Screening   Does the patient want to become pregnant in the next year? No    Does the patient's partner want to become pregnant in the next year? No    Would the patient like to discuss contraceptive options today? No      Contraception Wrap Up   Current Method Abstinence    End Method Abstinence    Contraception Counseling Provided No              Examination chaperoned by Raven Pupa LPN  Assessment:     1. Routine cervical smear Pap sent  2. PMB (postmenopausal bleeding) Bleeding pink, started in July Will get pelvic US to assess uterus and ovaries Pelvic US scheduled at [facility] 02/26/22 at 3:30 pm     Plan:    If continues to feel weak go to ER, she is drinking Pedialyte  Gave urine cup, if can bring in urine will get UA C&S Will talk when [place] results back Discussed if endometrial lining thickened will need biopsy

## 2022-02-26 ENCOUNTER — Ambulatory Visit (HOSPITAL_COMMUNITY): Payer: Medicare Other

## 2022-02-27 LAB — CYTOLOGY - PAP
Adequacy: ABSENT
Comment: NEGATIVE
Diagnosis: NEGATIVE
Diagnosis: REACTIVE
High risk HPV: NEGATIVE

## 2022-03-15 ENCOUNTER — Ambulatory Visit (HOSPITAL_COMMUNITY): Admission: RE | Admit: 2022-03-15 | Payer: Medicare Other | Source: Ambulatory Visit

## 2022-03-28 DIAGNOSIS — D518 Other vitamin B12 deficiency anemias: Secondary | ICD-10-CM | POA: Diagnosis not present

## 2022-03-28 DIAGNOSIS — E559 Vitamin D deficiency, unspecified: Secondary | ICD-10-CM | POA: Diagnosis not present

## 2022-03-28 DIAGNOSIS — E538 Deficiency of other specified B group vitamins: Secondary | ICD-10-CM | POA: Diagnosis not present

## 2022-03-28 DIAGNOSIS — E039 Hypothyroidism, unspecified: Secondary | ICD-10-CM | POA: Diagnosis not present

## 2022-03-28 DIAGNOSIS — Z9229 Personal history of other drug therapy: Secondary | ICD-10-CM | POA: Diagnosis not present

## 2022-03-28 DIAGNOSIS — R310 Gross hematuria: Secondary | ICD-10-CM | POA: Diagnosis not present

## 2022-03-28 DIAGNOSIS — I10 Essential (primary) hypertension: Secondary | ICD-10-CM | POA: Diagnosis not present

## 2022-03-28 DIAGNOSIS — Z0001 Encounter for general adult medical examination with abnormal findings: Secondary | ICD-10-CM | POA: Diagnosis not present

## 2022-03-28 DIAGNOSIS — Z681 Body mass index (BMI) 19 or less, adult: Secondary | ICD-10-CM | POA: Diagnosis not present

## 2022-03-29 ENCOUNTER — Other Ambulatory Visit: Payer: Self-pay | Admitting: Internal Medicine

## 2022-03-29 ENCOUNTER — Other Ambulatory Visit (HOSPITAL_COMMUNITY): Payer: Self-pay | Admitting: Internal Medicine

## 2022-03-29 DIAGNOSIS — R310 Gross hematuria: Secondary | ICD-10-CM

## 2022-05-07 ENCOUNTER — Ambulatory Visit (HOSPITAL_COMMUNITY): Payer: Medicare Other

## 2022-05-07 ENCOUNTER — Encounter (HOSPITAL_COMMUNITY): Payer: Self-pay

## 2022-06-19 ENCOUNTER — Encounter (HOSPITAL_COMMUNITY): Payer: Self-pay | Admitting: Radiology

## 2022-06-19 ENCOUNTER — Ambulatory Visit (HOSPITAL_COMMUNITY)
Admission: RE | Admit: 2022-06-19 | Discharge: 2022-06-19 | Disposition: A | Discharge status: Home / Self Care | Payer: Medicare Other | Source: Ambulatory Visit | Attending: Internal Medicine | Admitting: Internal Medicine

## 2022-06-19 DIAGNOSIS — K828 Other specified diseases of gallbladder: Secondary | ICD-10-CM | POA: Diagnosis not present

## 2022-06-19 DIAGNOSIS — R310 Gross hematuria: Secondary | ICD-10-CM | POA: Diagnosis not present

## 2022-06-19 DIAGNOSIS — K769 Liver disease, unspecified: Secondary | ICD-10-CM | POA: Diagnosis not present

## 2022-06-19 LAB — POCT I-STAT CREATININE: Creatinine, Ser: 1 mg/dL (ref 0.44–1.00)

## 2022-06-19 MED ORDER — IOHEXOL 300 MG/ML  SOLN
100.0000 mL | Freq: Once | INTRAMUSCULAR | Status: AC | PRN
  Administered 2022-06-19: 100 mL via INTRAVENOUS

## 2022-06-28 DIAGNOSIS — Z681 Body mass index (BMI) 19 or less, adult: Secondary | ICD-10-CM | POA: Diagnosis not present

## 2022-06-28 DIAGNOSIS — C679 Malignant neoplasm of bladder, unspecified: Secondary | ICD-10-CM | POA: Diagnosis not present

## 2022-06-28 DIAGNOSIS — I10 Essential (primary) hypertension: Secondary | ICD-10-CM | POA: Diagnosis not present

## 2022-06-28 DIAGNOSIS — R310 Gross hematuria: Secondary | ICD-10-CM | POA: Diagnosis not present

## 2022-07-29 ENCOUNTER — Ambulatory Visit: Payer: Medicare Other | Admitting: Urology

## 2022-07-29 VITALS — BP 143/82 | HR 88

## 2022-07-29 DIAGNOSIS — N3289 Other specified disorders of bladder: Secondary | ICD-10-CM

## 2022-07-29 DIAGNOSIS — C679 Malignant neoplasm of bladder, unspecified: Secondary | ICD-10-CM | POA: Diagnosis not present

## 2022-07-29 LAB — MICROSCOPIC EXAMINATION: Bacteria, UA: NONE SEEN

## 2022-07-29 LAB — URINALYSIS, ROUTINE W REFLEX MICROSCOPIC
Bilirubin, UA: NEGATIVE
Glucose, UA: NEGATIVE
Ketones, UA: NEGATIVE
Leukocytes,UA: NEGATIVE
Nitrite, UA: NEGATIVE
Specific Gravity, UA: 1.02 (ref 1.005–1.030)
Urobilinogen, Ur: 0.2 mg/dL (ref 0.2–1.0)
pH, UA: 7 (ref 5.0–7.5)

## 2022-07-29 MED ORDER — CIPROFLOXACIN HCL 500 MG PO TABS: 500.0000 mg | ORAL_TABLET | Freq: Once | ORAL | Status: DC

## 2022-07-29 NOTE — Progress Notes (Signed)
07/29/2022 2:25 PM   Raven Lopez [birth year] [id number]  Referring provider: Redmond School, MD [address] [place],  [place] [address]  Bladder mass   HPI: Raven Lopez is a [phone] here for evaluation of a bladder mass. She underwent CT in December 2023 which showed a 4-5cm bladder mass. She has a remote hx of gross hematuria. She has a 50pk year smoking hx.    PMH: Past Medical History:  Diagnosis Date   AC (acromioclavicular) joint bone spurs    Anxiety    DDD (degenerative disc disease), lumbar    Scoliosis     Surgical History: Past Surgical History:  Procedure Laterality Date   CESAREAN SECTION      Home Medications:  Allergies as of 07/29/2022       Reactions   Penicillins Shortness Of Breath, Swelling   Has patient had a PCN reaction causing immediate rash, facial/tongue/throat swelling, SOB or lightheadedness with hypotension: Yes Has patient had a PCN reaction causing severe rash involving mucus membranes or skin necrosis: No Has patient had a PCN reaction that required hospitalization: Yes Has patient had a PCN reaction occurring within the last 10 years: No If all of the above answers are "NO", then may proceed with Cephalosporin use.   Aspirin    REACTION: dizzy,weak and upset stomach   Demerol [meperidine Hcl] Other (See Comments)   Altered mental status-anger, hallucinations   Sulfonamide Derivatives Nausea And Vomiting        Medication List        Accurate as of July 29, 2022  2:25 PM. If you have any questions, ask your nurse or doctor.          acetaminophen 500 MG tablet Commonly known as: TYLENOL Take 1,000 mg by mouth every 4 (four) hours as needed for mild pain or moderate pain.   amLODipine 2.5 MG tablet Commonly known as: NORVASC Take 2.5 mg by mouth daily.   diazepam 10 MG tablet Commonly known as: VALIUM Take 10 mg by mouth 4 (four) times daily as needed for anxiety.   losartan 25 MG tablet Commonly known as:  COZAAR Take 25 mg by mouth daily.        Allergies:  Allergies  Allergen Reactions   Penicillins Shortness Of Breath and Swelling    Has patient had a PCN reaction causing immediate rash, facial/tongue/throat swelling, SOB or lightheadedness with hypotension: Yes Has patient had a PCN reaction causing severe rash involving mucus membranes or skin necrosis: No Has patient had a PCN reaction that required hospitalization: Yes Has patient had a PCN reaction occurring within the last 10 years: No If all of the above answers are "NO", then may proceed with Cephalosporin use.    Aspirin     REACTION: dizzy,weak and upset stomach   Demerol [Meperidine Hcl] Other (See Comments)    Altered mental status-anger, hallucinations   Sulfonamide Derivatives Nausea And Vomiting    Family History: Family History  Problem Relation Age of Onset   Aneurysm Father    Kidney failure Mother    Hypertension Sister    Thyroid disease Sister    Anxiety disorder Sister    Colitis Sister    Irritable bowel syndrome Sister     Social History:  reports that she has been smoking cigarettes. She has a 40.00 pack-year smoking history. She has never used smokeless tobacco. She reports current alcohol use. She reports that she does not currently use drugs.  ROS: All other  review of systems were reviewed and are negative except what is noted above in HPI  Physical Exam: BP (!) 143/82   Pulse 88   Constitutional:  Alert and oriented, No acute distress. HEENT: [place] AT, moist mucus membranes.  Trachea midline, no masses. Cardiovascular: No clubbing, cyanosis, or edema. Respiratory: Normal respiratory effort, no increased work of breathing. GI: Abdomen is soft, nontender, nondistended, no abdominal masses GU: No CVA tenderness.  Lymph: No cervical or inguinal lymphadenopathy. Skin: No rashes, bruises or suspicious lesions. Neurologic: Grossly intact, no focal deficits, moving all 4  extremities. Psychiatric: Normal mood and affect.  Laboratory Data: Lab Results  Component Value Date   WBC 7.9 02/19/2022   HGB 14.3 02/19/2022   HCT 43.5 02/19/2022   MCV 91.6 02/19/2022   PLT 241 02/19/2022    Lab Results  Component Value Date   CREATININE 1.00 06/19/2022    No results found for: "PSA"  No results found for: "TESTOSTERONE"  No results found for: "HGBA1C"  Urinalysis    Component Value Date/Time   COLORURINE AMBER (A) 02/19/2022 1730   APPEARANCEUR HAZY (A) 02/19/2022 1730   LABSPEC 1.009 02/19/2022 1730   PHURINE 6.0 02/19/2022 1730   GLUCOSEU NEGATIVE 02/19/2022 1730   HGBUR MODERATE (A) 02/19/2022 1730   BILIRUBINUR NEGATIVE 02/19/2022 1730   BILIRUBINUR negative 02/15/2022 1325   KETONESUR NEGATIVE 02/19/2022 1730   PROTEINUR 100 (A) 02/19/2022 1730   UROBILINOGEN 0.2 02/15/2022 1325   UROBILINOGEN 0.2 04/05/2010 0558   NITRITE NEGATIVE 02/19/2022 1730   LEUKOCYTESUR NEGATIVE 02/19/2022 1730    Lab Results  Component Value Date   BACTERIA RARE (A) 02/19/2022    Pertinent Imaging: CT 12/13/023:  No results found for this or any previous visit.  No results found for this or any previous visit.  No results found for this or any previous visit.  No results found for this or any previous visit.  No results found for this or any previous visit.  No valid procedures specified. No results found for this or any previous visit.  No results found for this or any previous visit.   Assessment & Plan:    1.  Bladder mass We discussed the management of bladder tumors including transurethral resection and after discussing the procedure the patient elects to proceed with surgery. Risks/benefits/alternatives to transurethral resection of the bladder tumor was explained to the patient    No follow-ups on file.  Nicolette Bang, MD  [facility] [place]   Cystoscopy Procedure Note  Patient identification was confirmed,  informed consent was obtained, and patient was prepped using Betadine solution.  Lidocaine jelly was administered per urethral meatus.    Procedure: - Flexible cystoscope introduced, without any difficulty.   - Thorough search of the bladder revealed:    normal urethral meatus    normal urothelium    no stones    no ulcers     6cm sessile posterior wall and trigone tumor    no urethral polyps    no trabeculation  - Ureteral orifices were normal in position and appearance.  Post-Procedure: - Patient tolerated the procedure well    Nicolette Bang, MD

## 2022-07-30 ENCOUNTER — Telehealth: Payer: Self-pay

## 2022-07-30 NOTE — Telephone Encounter (Signed)
I spoke with Raven Lopez while in office on 07/29/2022. We have discussed possible surgery dates and 08/29/2022 was agreed upon by all parties. Patient given information about surgery date, what to expect pre-operatively and post operatively.    We discussed that a pre-op nurse will be calling to set up the pre-op visit that will take place prior to surgery. Informed patient that our office will communicate any additional care to be provided after surgery.    Patients questions or concerns were discussed during our call. Advised to call our office should there be any additional information, questions or concerns that arise. Patient verbalized understanding.

## 2022-08-05 ENCOUNTER — Encounter: Payer: Self-pay | Admitting: Urology

## 2022-08-05 NOTE — Patient Instructions (Signed)
Transurethral Resection of Bladder Tumor  Transurethral resection of a bladder tumor is the removal (resection) of cancerous tissue (tumor) from the inside wall of the bladder. The bladder is the organ that holds urine. The tumor is removed through the tube that carries urine out of the body (urethra). In a transurethral resection, a thin telescope with a light, a tiny camera, and an electric cutting edge (resectoscope) is passed through the urethra. In men, the opening of the urethra is at the end of the penis. In women, it is just above the opening of the vagina. Tell a health care provider about: Any allergies you have. All medicines you are taking, including vitamins, herbs, eye drops, creams, and over-the-counter medicines. Any problems you or family members have had with anesthetic medicines. Any bleeding problems you have. Any surgeries you have had. Any medical conditions you have, including recent urinary tract infections. Whether you are pregnant or may be pregnant. What are the risks? Generally, this is a safe procedure. However, problems may occur, including: Infection. Bleeding. Allergic reactions to medicines. Damage to nearby structures or organs. Difficulty urinating from blockage of the urethra or not being able to urinate (urinary retention). Deep vein thrombosis. This is a blood clot that can develop in your leg. Recurring cancer. What happens before the procedure? When to stop eating and drinking Follow instructions from your health care provider about what you may eat and drink before your procedure. These may include: 8 hours before your procedure Stop eating most foods. Do not eat meat, fried foods, or fatty foods. Eat only light foods, such as toast or crackers. All liquids are okay except energy drinks and alcohol. 6 hours before your procedure Stop eating. Drink only clear liquids, such as water, clear fruit juice, black coffee, plain tea, and sports  drinks. Do not drink energy drinks or alcohol. 2 hours before your procedure Stop drinking all liquids. You may be allowed to take medicines with small sips of water. Medicines Ask your health care provider about: Changing or stopping your regular medicines. This is especially important if you are taking diabetes medicines or blood thinners. Taking medicines such as aspirin and ibuprofen. These medicines can thin your blood. Do not take these medicines unless your health care provider tells you to take them. Taking over-the-counter medicines, vitamins, herbs, and supplements. General instructions If you will be going home right after the procedure, plan to have a responsible adult: Take you home from the hospital or clinic. You will not be allowed to drive. Care for you for the time you are told. Ask your health care provider what steps will be taken to help prevent infection. These steps may include: Washing skin with a germ-killing soap. Taking antibiotic medicine. Do not use any products that contain nicotine or tobacco for at least 4 weeks before the procedure. These products include cigarettes, chewing tobacco, and vaping devices, such as e-cigarettes. If you need help quitting, ask your health care provider. What happens during the procedure? An IV will be inserted into one of your veins. You will be given one or more of the following: A medicine to help you relax (sedative). A medicine that is injected into your spine to numb the area below and slightly above the injection site (spinal anesthetic). A medicine that is injected into an area of your body to numb everything below the injection site (regional anesthetic). A medicine to make you fall asleep (general anesthetic). Your legs will be placed in foot rests (  stirrups) to open your legs and bend your knees. The resectoscope will be passed through your urethra and into your bladder. The part of your bladder with the tumor will be  resected by the cutting edge of the resectoscope. Fluid will be passed to rinse out the cut tissues (irrigation). The resectoscope will then be taken out. A small, thin tube (catheter) will be passed through your urethra and into your bladder. The catheter will drain urine into a bag outside of your body. The procedure may vary among health care providers and hospitals. What happens after the procedure? Your blood pressure, heart rate, breathing rate, and blood oxygen level will be monitored until you leave the hospital or clinic. You may continue to receive fluids and medicines through an IV. You will be given pain medicine to relieve pain. You will have a catheter to drain your urine. The amount of urine will be measured. If you have blood in your urine, your bladder may be rinsed out by passing fluid through your catheter. You will be encouraged to walk as soon as you can. You may have to wear compression stockings. These stockings help to prevent blood clots and reduce swelling in your legs. If you were given a sedative during the procedure, it can affect you for several hours. Do not drive or operate machinery until your health care provider says that it is safe. Summary Transurethral resection of a bladder tumor is the removal (resection) of a cancerous growth (tumor) on the inside wall of the bladder. To do this procedure, your health care provider uses a thin telescope with a light, a tiny camera, and an electric cutting edge (resectoscope) that is guided to your bladder through your urethra. The part of your bladder that is affected by the tumor will be resected by the cutting edge of the resectoscope. A catheter will be passed through your urethra and into your bladder. The catheter will drain urine into a bag outside of your body. If you will be going home right after the procedure, plan to have a responsible adult take you home from the hospital or clinic. You will not be allowed to  drive. This information is not intended to replace advice given to you by your health care provider. Make sure you discuss any questions you have with your health care provider. Document Revised: 06/29/2021 Document Reviewed: 06/29/2021 Else[employer] Patient Education  2023 [employer]  

## 2022-08-26 NOTE — Patient Instructions (Signed)
Raven Lopez  08/26/2022     @[email or web address]$ @   Your procedure is scheduled on 08/29/2022.  Report to [facility] at 6:30 A.M.  Call this number if you have problems the morning of surgery:  [phone]  If you experience any cold or flu symptoms such as cough, fever, chills, shortness of breath, etc. between now and your scheduled surgery, please notify us at the above number.   Remember:    Do not eat or drink after midnight.    Take these medicines the morning of surgery with A SIP OF WATER : Norvasc and Valium    Do not wear jewelry, make-up or nail polish.  Do not wear lotions, powders, or perfumes, or deodorant.  Do not shave 48 hours prior to surgery.  Men may shave face and neck.  Do not bring valuables to the hospital.  [facility] is not responsible for any belongings or valuables.  Contacts, dentures or bridgework may not be worn into surgery.  Leave your suitcase in the car.  After surgery it may be brought to your room.  For patients admitted to the hospital, discharge time will be determined by your treatment team.  Patients discharged the day of surgery will not be allowed to drive home.   Name and phone number of your driver:   family Special instructions:  n/a  Please read over the following fact sheets that you were given. Care and Recovery After Surgery   Transurethral Resection of Bladder Tumor  Transurethral resection of a bladder tumor is the removal (resection) of cancerous tissue (tumor) from the inside wall of the bladder. The bladder is the organ that holds urine. The tumor is removed through the tube that carries urine out of the body (urethra). In a transurethral resection, a thin telescope with a light, a tiny camera, and an electric cutting edge (resectoscope) is passed through the urethra. In men, the opening of the urethra is at the end of the penis. In women, it is just above the opening of the vagina. Tell a health care provider  about: Any allergies you have. All medicines you are taking, including vitamins, herbs, eye drops, creams, and over-the-counter medicines. Any problems you or family members have had with anesthetic medicines. Any bleeding problems you have. Any surgeries you have had. Any medical conditions you have, including recent urinary tract infections. Whether you are pregnant or may be pregnant. What are the risks? Generally, this is a safe procedure. However, problems may occur, including: Infection. Bleeding. Allergic reactions to medicines. Damage to nearby structures or organs. Difficulty urinating from blockage of the urethra or not being able to urinate (urinary retention). Deep vein thrombosis. This is a blood clot that can develop in your leg. Recurring cancer. What happens before the procedure? When to stop eating and drinking Follow instructions from your health care provider about what you may eat and drink before your procedure. These may include: 8 hours before your procedure Stop eating most foods. Do not eat meat, fried foods, or fatty foods. Eat only light foods, such as toast or crackers. All liquids are okay except energy drinks and alcohol. 6 hours before your procedure Stop eating. Drink only clear liquids, such as water, clear fruit juice, black coffee, plain tea, and sports drinks. Do not drink energy drinks or alcohol. 2 hours before your procedure Stop drinking all liquids. You may be allowed to take medicines with small sips of water. Medicines Ask your health care provider  about: Changing or stopping your regular medicines. This is especially important if you are taking diabetes medicines or blood thinners. Taking medicines such as aspirin and ibuprofen. These medicines can thin your blood. Do not take these medicines unless your health care provider tells you to take them. Taking over-the-counter medicines, vitamins, herbs, and supplements. General  instructions If you will be going home right after the procedure, plan to have a responsible adult: Take you home from the hospital or clinic. You will not be allowed to drive. Care for you for the time you are told. Ask your health care provider what steps will be taken to help prevent infection. These steps may include: Washing skin with a germ-killing soap. Taking antibiotic medicine. Do not use any products that contain nicotine or tobacco for at least 4 weeks before the procedure. These products include cigarettes, chewing tobacco, and vaping devices, such as e-cigarettes. If you need help quitting, ask your health care provider. What happens during the procedure? An IV will be inserted into one of your veins. You will be given one or more of the following: A medicine to help you relax (sedative). A medicine that is injected into your spine to numb the area below and slightly above the injection site (spinal anesthetic). A medicine that is injected into an area of your body to numb everything below the injection site (regional anesthetic). A medicine to make you fall asleep (general anesthetic). Your legs will be placed in foot rests (stirrups) to open your legs and bend your knees. The resectoscope will be passed through your urethra and into your bladder. The part of your bladder with the tumor will be resected by the cutting edge of the resectoscope. Fluid will be passed to rinse out the cut tissues (irrigation). The resectoscope will then be taken out. A small, thin tube (catheter) will be passed through your urethra and into your bladder. The catheter will drain urine into a bag outside of your body. The procedure may vary among health care providers and hospitals. What happens after the procedure? Your blood pressure, heart rate, breathing rate, and blood oxygen level will be monitored until you leave the hospital or clinic. You may continue to receive fluids and medicines through  an IV. You will be given pain medicine to relieve pain. You will have a catheter to drain your urine. The amount of urine will be measured. If you have blood in your urine, your bladder may be rinsed out by passing fluid through your catheter. You will be encouraged to walk as soon as you can. You may have to wear compression stockings. These stockings help to prevent blood clots and reduce swelling in your legs. If you were given a sedative during the procedure, it can affect you for several hours. Do not drive or operate machinery until your health care provider says that it is safe. Summary Transurethral resection of a bladder tumor is the removal (resection) of a cancerous growth (tumor) on the inside wall of the bladder. To do this procedure, your health care provider uses a thin telescope with a light, a tiny camera, and an electric cutting edge (resectoscope) that is guided to your bladder through your urethra. The part of your bladder that is affected by the tumor will be resected by the cutting edge of the resectoscope. A catheter will be passed through your urethra and into your bladder. The catheter will drain urine into a bag outside of your body. If you will be going  home right after the procedure, plan to have a responsible adult take you home from the hospital or clinic. You will not be allowed to drive. This information is not intended to replace advice given to you by your health care provider. Make sure you discuss any questions you have with your health care provider. Document Revised: 06/29/2021 Document Reviewed: 06/29/2021 Elsevier Patient Education  [place] Anesthesia, Adult General anesthesia is the use of medicine to make you fall asleep (unconscious) for a medical procedure. General anesthesia must be used for certain procedures. It is often recommended for surgery or procedures that: Last a long time. Require you to be still or in an unusual  position. Are major and can cause blood loss. Affect your breathing. The medicines used for general anesthesia are called general anesthetics. During general anesthesia, these medicines are given along with medicines that: Prevent pain. Control your blood pressure. Relax your muscles. Prevent nausea and vomiting after the procedure. Tell a health care provider about: Any allergies you have. All medicines you are taking, including vitamins, herbs, eye drops, creams, and over-the-counter medicines. Your history of any: Medical conditions you have, including: High blood pressure. Bleeding problems. Diabetes. Heart or lung conditions, such as: Heart failure. Sleep apnea. Asthma. Chronic obstructive pulmonary disease (COPD). Current or recent illnesses, such as: Upper respiratory, chest, or ear infections. Cough or fever. Tobacco or drug use, including marijuana or alcohol use. Depression or anxiety. Surgeries and types of anesthetics you have had. Problems you or family members have had with anesthetic medicines. Whether you are pregnant or may be pregnant. Whether you have any chipped or loose teeth, dentures, caps, bridgework, or issues with your mouth, swallowing, or choking. What are the risks? Your health care provider will talk with you about risks. These may include: Allergic reaction to the medicines. Lung and heart problems. Inhaling food or liquid from the stomach into the lungs (aspiration). Nerve injury. Injury to the lips, mouth, teeth, or gums. Stroke. Waking up during your procedure and being unable to move. This is rare. These problems are more likely to develop if you are having a major surgery or if you have an advanced or serious medical condition. You can prevent some of these complications by answering all of your health care provider's questions thoroughly and by following all instructions before your procedure. General anesthesia can cause side effects,  including: Nausea or vomiting. A sore throat or hoarseness from the breathing tube. Wheezing or coughing. Shaking chills or feeling cold. Body aches. Sleepiness. Confusion, agitation (delirium), or anxiety. What happens before the procedure? When to stop eating and drinking Follow instructions from your health care provider about what you may eat and drink before your procedure. If you do not follow your health care provider's instructions, your procedure may be delayed or canceled. Medicines Ask your health care provider about: Changing or stopping your regular medicines. These include any diabetes medicines or blood thinners you take. Taking medicines such as aspirin and ibuprofen. These medicines can thin your blood. Do not take them unless your health care provider tells you to. Taking over-the-counter medicines, vitamins, herbs, and supplements. General instructions Do not use any products that contain nicotine or tobacco for at least 4 weeks before the procedure. These products include cigarettes, chewing tobacco, and vaping devices, such as e-cigarettes. If you need help quitting, ask your health care provider. If you brush your teeth on the morning of the procedure, make sure to spit out all  of the water and toothpaste. If told by your health care provider, bring your sleep apnea device with you to surgery (if applicable). If you will be going home right after the procedure, plan to have a responsible adult: Take you home from the hospital or clinic. You will not be allowed to drive. Care for you for the time you are told. What happens during the procedure?  An IV will be inserted into one of your veins. You will be given one or more of the following through a face mask or IV: A sedative. This helps you relax. Anesthesia. This will: Numb certain areas of your body. Make you fall asleep for surgery. After you are unconscious, a breathing tube may be inserted down your throat to  help you breathe. This will be removed before you wake up. An anesthesia provider, such as an anesthesiologist, will stay with you throughout your procedure. The anesthesia provider will: Keep you comfortable and safe by continuing to give you medicines and adjusting the amount of medicine that you get. Monitor your blood pressure, heart rate, and oxygen levels to make sure that the anesthetics do not cause any problems. The procedure may vary among health care providers and hospitals. What happens after the procedure? Your blood pressure, temperature, heart rate, breathing rate, and blood oxygen level will be monitored until you leave the hospital or clinic. You will wake up in a recovery area. You may wake up slowly. You may be given medicine to help you with pain, nausea, or any other side effects from the anesthesia. Summary General anesthesia is the use of medicine to make you fall asleep (unconscious) for a medical procedure. Follow your health care provider's instructions about when to stop eating, drinking, or taking certain medicines before your procedure. Plan to have a responsible adult take you home from the hospital or clinic. This information is not intended to replace advice given to you by your health care provider. Make sure you discuss any questions you have with your health care provider. Document Revised: 09/20/2021 Document Reviewed: 09/20/2021 Elsevier Patient Education  [place].  How to Use Chlorhexidine Before Surgery Chlorhexidine gluconate (CHG) is a germ-killing (antiseptic) solution that is used to clean the skin. It can get rid of the bacteria that normally live on the skin and can keep them away for about 24 hours. To clean your skin with CHG, you may be given: A CHG solution to use in the shower or as part of a sponge bath. A prepackaged cloth that contains CHG. Cleaning your skin with CHG may help lower the risk for infection: While you are staying in  the intensive care unit of the hospital. If you have a vascular access, such as a central line, to provide short-term or long-term access to your veins. If you have a catheter to drain urine from your bladder. If you are on a ventilator. A ventilator is a machine that helps you breathe by moving air in and out of your lungs. After surgery. What are the risks? Risks of using CHG include: A skin reaction. Hearing loss, if CHG gets in your ears and you have a perforated eardrum. Eye injury, if CHG gets in your eyes and is not rinsed out. The CHG product catching fire. Make sure that you avoid smoking and flames after applying CHG to your skin. Do not use CHG: If you have a chlorhexidine allergy or have previously reacted to chlorhexidine. On babies younger than 2 months of  age. How to use CHG solution Use CHG only as told by your health care provider, and follow the instructions on the label. Use the full amount of CHG as directed. Usually, this is one bottle. During a shower Follow these steps when using CHG solution during a shower (unless your health care provider gives you different instructions): Start the shower. Use your normal soap and shampoo to wash your face and hair. Turn off the shower or move out of the shower stream. Pour the CHG onto a clean washcloth. Do not use any type of brush or rough-edged sponge. Starting at your neck, lather your body down to your toes. Make sure you follow these instructions: If you will be having surgery, pay special attention to the part of your body where you will be having surgery. Scrub this area for at least 1 minute. Do not use CHG on your head or face. If the solution gets into your ears or eyes, rinse them well with water. Avoid your genital area. Avoid any areas of skin that have broken skin, cuts, or scrapes. Scrub your back and under your arms. Make sure to wash skin folds. Let the lather sit on your skin for 1-2 minutes or as long as  told by your health care provider. Thoroughly rinse your entire body in the shower. Make sure that all body creases and crevices are rinsed well. Dry off with a clean towel. Do not put any substances on your body afterward--such as powder, lotion, or perfume--unless you are told to do so by your health care provider. Only use lotions that are recommended by the manufacturer. Put on clean clothes or pajamas. If it is the night before your surgery, sleep in clean sheets.  During a sponge bath Follow these steps when using CHG solution during a sponge bath (unless your health care provider gives you different instructions): Use your normal soap and shampoo to wash your face and hair. Pour the CHG onto a clean washcloth. Starting at your neck, lather your body down to your toes. Make sure you follow these instructions: If you will be having surgery, pay special attention to the part of your body where you will be having surgery. Scrub this area for at least 1 minute. Do not use CHG on your head or face. If the solution gets into your ears or eyes, rinse them well with water. Avoid your genital area. Avoid any areas of skin that have broken skin, cuts, or scrapes. Scrub your back and under your arms. Make sure to wash skin folds. Let the lather sit on your skin for 1-2 minutes or as long as told by your health care provider. Using a different clean, wet washcloth, thoroughly rinse your entire body. Make sure that all body creases and crevices are rinsed well. Dry off with a clean towel. Do not put any substances on your body afterward--such as powder, lotion, or perfume--unless you are told to do so by your health care provider. Only use lotions that are recommended by the manufacturer. Put on clean clothes or pajamas. If it is the night before your surgery, sleep in clean sheets. How to use CHG prepackaged cloths Only use CHG cloths as told by your health care provider, and follow the instructions  on the label. Use the CHG cloth on clean, dry skin. Do not use the CHG cloth on your head or face unless your health care provider tells you to. When washing with the CHG cloth: Avoid your genital  area. Avoid any areas of skin that have broken skin, cuts, or scrapes. Before surgery Follow these steps when using a CHG cloth to clean before surgery (unless your health care provider gives you different instructions): Using the CHG cloth, vigorously scrub the part of your body where you will be having surgery. Scrub using a back-and-forth motion for 3 minutes. The area on your body should be completely wet with CHG when you are done scrubbing. Do not rinse. Discard the cloth and let the area air-dry. Do not put any substances on the area afterward, such as powder, lotion, or perfume. Put on clean clothes or pajamas. If it is the night before your surgery, sleep in clean sheets.  For general bathing Follow these steps when using CHG cloths for general bathing (unless your health care provider gives you different instructions). Use a separate CHG cloth for each area of your body. Make sure you wash between any folds of skin and between your fingers and toes. Wash your body in the following order, switching to a new cloth after each step: The front of your neck, shoulders, and chest. Both of your arms, under your arms, and your hands. Your stomach and groin area, avoiding the genitals. Your right leg and foot. Your left leg and foot. The back of your neck, your back, and your buttocks. Do not rinse. Discard the cloth and let the area air-dry. Do not put any substances on your body afterward--such as powder, lotion, or perfume--unless you are told to do so by your health care provider. Only use lotions that are recommended by the manufacturer. Put on clean clothes or pajamas. Contact a health care provider if: Your skin gets irritated after scrubbing. You have questions about using your solution or  cloth. You swallow any chlorhexidine. Call your local poison control center (1-(904)754-4731 in the [place] Get help right away if: Your eyes itch badly, or they become very red or swollen. Your skin itches badly and is red or swollen. Your hearing changes. You have trouble seeing. You have swelling or tingling in your mouth or throat. You have trouble breathing. These symptoms may represent a serious problem that is an emergency. Do not wait to see if the symptoms will go away. Get medical help right away. Call your local emergency services (911 in the [place] Do not drive yourself to the hospital. Summary Chlorhexidine gluconate (CHG) is a germ-killing (antiseptic) solution that is used to clean the skin. Cleaning your skin with CHG may help to lower your risk for infection. You may be given CHG to use for bathing. It may be in a bottle or in a prepackaged cloth to use on your skin. Carefully follow your health care provider's instructions and the instructions on the product label. Do not use CHG if you have a chlorhexidine allergy. Contact your health care provider if your skin gets irritated after scrubbing. This information is not intended to replace advice given to you by your health care provider. Make sure you discuss any questions you have with your health care provider. Document Revised: 10/22/2021 Document Reviewed: 09/04/2020 Elsevier Patient Education  [place].

## 2022-08-27 ENCOUNTER — Encounter (HOSPITAL_COMMUNITY)
Admission: RE | Admit: 2022-08-27 | Discharge: 2022-08-27 | Disposition: A | Discharge status: Home / Self Care | Payer: Medicare Other | Source: Ambulatory Visit | Attending: Urology | Admitting: Urology

## 2022-08-27 ENCOUNTER — Encounter (HOSPITAL_COMMUNITY): Payer: Self-pay

## 2022-08-27 DIAGNOSIS — Z01818 Encounter for other preprocedural examination: Secondary | ICD-10-CM

## 2022-08-27 DIAGNOSIS — I10 Essential (primary) hypertension: Secondary | ICD-10-CM | POA: Insufficient documentation

## 2022-08-27 HISTORY — DX: Chronic obstructive pulmonary disease, unspecified: J44.9

## 2022-08-27 HISTORY — DX: Panic disorder (episodic paroxysmal anxiety): F41.0

## 2022-08-27 HISTORY — DX: Unspecified asthma, uncomplicated: J45.909

## 2022-08-27 HISTORY — DX: Essential (primary) hypertension: I10

## 2022-08-27 HISTORY — DX: Fracture of neck, unspecified, initial encounter: S12.9XXA

## 2022-08-27 LAB — CBC WITH DIFFERENTIAL/PLATELET
Abs Immature Granulocytes: 0.02 10*3/uL (ref 0.00–0.07)
Basophils Absolute: 0 10*3/uL (ref 0.0–0.1)
Basophils Relative: 0 %
Eosinophils Absolute: 0.1 10*3/uL (ref 0.0–0.5)
Eosinophils Relative: 1 %
HCT: 45.4 % (ref 36.0–46.0)
Hemoglobin: 14.6 g/dL (ref 12.0–15.0)
Immature Granulocytes: 0 %
Lymphocytes Relative: 21 %
Lymphs Abs: 2.1 10*3/uL (ref 0.7–4.0)
MCH: 29.9 pg (ref 26.0–34.0)
MCHC: 32.2 g/dL (ref 30.0–36.0)
MCV: 92.8 fL (ref 80.0–100.0)
Monocytes Absolute: 0.8 10*3/uL (ref 0.1–1.0)
Monocytes Relative: 7 %
Neutro Abs: 7.3 10*3/uL (ref 1.7–7.7)
Neutrophils Relative %: 71 %
Platelets: 259 10*3/uL (ref 150–400)
RBC: 4.89 MIL/uL (ref 3.87–5.11)
RDW: 13.9 % (ref 11.5–15.5)
WBC: 10.4 10*3/uL (ref 4.0–10.5)
nRBC: 0 % (ref 0.0–0.2)

## 2022-08-27 LAB — BASIC METABOLIC PANEL
Anion gap: 11 (ref 5–15)
BUN: 14 mg/dL (ref 8–23)
CO2: 24 mmol/L (ref 22–32)
Calcium: 9.1 mg/dL (ref 8.9–10.3)
Chloride: 100 mmol/L (ref 98–111)
Creatinine, Ser: 1.1 mg/dL — ABNORMAL HIGH (ref 0.44–1.00)
GFR, Estimated: 55 mL/min — ABNORMAL LOW (ref >60)
Glucose, Bld: 110 mg/dL — ABNORMAL HIGH (ref 70–99)
Potassium: 3.8 mmol/L (ref 3.5–5.1)
Sodium: 135 mmol/L (ref 135–145)

## 2022-08-29 ENCOUNTER — Encounter (HOSPITAL_COMMUNITY)
Admission: RE | Disposition: A | Discharge status: Home / Self Care | Payer: Self-pay | Source: Home / Self Care | Attending: Urology

## 2022-08-29 ENCOUNTER — Encounter (HOSPITAL_COMMUNITY): Payer: Self-pay | Admitting: Urology

## 2022-08-29 ENCOUNTER — Ambulatory Visit (HOSPITAL_BASED_OUTPATIENT_CLINIC_OR_DEPARTMENT_OTHER): Payer: Medicare Other | Admitting: Anesthesiology

## 2022-08-29 ENCOUNTER — Ambulatory Visit (HOSPITAL_COMMUNITY)
Admission: RE | Admit: 2022-08-29 | Discharge: 2022-08-29 | Disposition: A | Discharge status: Home / Self Care | Payer: Medicare Other | Attending: Urology | Admitting: Urology

## 2022-08-29 ENCOUNTER — Other Ambulatory Visit: Payer: Self-pay

## 2022-08-29 ENCOUNTER — Ambulatory Visit (HOSPITAL_COMMUNITY): Payer: Medicare Other | Admitting: Anesthesiology

## 2022-08-29 ENCOUNTER — Ambulatory Visit (HOSPITAL_COMMUNITY): Payer: Medicare Other

## 2022-08-29 DIAGNOSIS — Z79899 Other long term (current) drug therapy: Secondary | ICD-10-CM | POA: Insufficient documentation

## 2022-08-29 DIAGNOSIS — M1990 Unspecified osteoarthritis, unspecified site: Secondary | ICD-10-CM | POA: Diagnosis not present

## 2022-08-29 DIAGNOSIS — F17210 Nicotine dependence, cigarettes, uncomplicated: Secondary | ICD-10-CM

## 2022-08-29 DIAGNOSIS — J449 Chronic obstructive pulmonary disease, unspecified: Secondary | ICD-10-CM | POA: Insufficient documentation

## 2022-08-29 DIAGNOSIS — F17200 Nicotine dependence, unspecified, uncomplicated: Secondary | ICD-10-CM | POA: Insufficient documentation

## 2022-08-29 DIAGNOSIS — I700 Atherosclerosis of aorta: Secondary | ICD-10-CM | POA: Diagnosis not present

## 2022-08-29 DIAGNOSIS — N281 Cyst of kidney, acquired: Secondary | ICD-10-CM | POA: Insufficient documentation

## 2022-08-29 DIAGNOSIS — I10 Essential (primary) hypertension: Secondary | ICD-10-CM | POA: Diagnosis not present

## 2022-08-29 DIAGNOSIS — D630 Anemia in neoplastic disease: Secondary | ICD-10-CM | POA: Diagnosis not present

## 2022-08-29 DIAGNOSIS — D494 Neoplasm of unspecified behavior of bladder: Secondary | ICD-10-CM

## 2022-08-29 DIAGNOSIS — X58XXXA Exposure to other specified factors, initial encounter: Secondary | ICD-10-CM | POA: Insufficient documentation

## 2022-08-29 DIAGNOSIS — F419 Anxiety disorder, unspecified: Secondary | ICD-10-CM | POA: Insufficient documentation

## 2022-08-29 DIAGNOSIS — M4856XA Collapsed vertebra, not elsewhere classified, lumbar region, initial encounter for fracture: Secondary | ICD-10-CM | POA: Diagnosis not present

## 2022-08-29 DIAGNOSIS — C679 Malignant neoplasm of bladder, unspecified: Secondary | ICD-10-CM | POA: Insufficient documentation

## 2022-08-29 DIAGNOSIS — C674 Malignant neoplasm of posterior wall of bladder: Secondary | ICD-10-CM

## 2022-08-29 HISTORY — PX: CYSTOSCOPY W/ RETROGRADES: SHX1426

## 2022-08-29 HISTORY — PX: BLADDER INSTILLATION: SHX6893

## 2022-08-29 HISTORY — PX: TRANSURETHRAL RESECTION OF BLADDER TUMOR: SHX2575

## 2022-08-29 SURGERY — CYSTOSCOPY, WITH RETROGRADE PYELOGRAM
Anesthesia: General | Site: Urethra

## 2022-08-29 MED ORDER — PROPOFOL 10 MG/ML IV BOLUS
INTRAVENOUS | Status: AC
  Filled 2022-08-29: qty 20

## 2022-08-29 MED ORDER — ONDANSETRON HCL 4 MG/2ML IJ SOLN
INTRAMUSCULAR | Status: AC
  Filled 2022-08-29: qty 2

## 2022-08-29 MED ORDER — MIDAZOLAM HCL 2 MG/2ML IJ SOLN
INTRAMUSCULAR | Status: AC
  Filled 2022-08-29: qty 2

## 2022-08-29 MED ORDER — SODIUM CHLORIDE 0.9 % IR SOLN
Status: DC | PRN
  Administered 2022-08-29 (×4): 3000 mL

## 2022-08-29 MED ORDER — FENTANYL CITRATE (PF) 100 MCG/2ML IJ SOLN
INTRAMUSCULAR | Status: AC
  Filled 2022-08-29: qty 2

## 2022-08-29 MED ORDER — PROPOFOL 10 MG/ML IV BOLUS
INTRAVENOUS | Status: DC | PRN
  Administered 2022-08-29: 100 mg via INTRAVENOUS

## 2022-08-29 MED ORDER — CHLORHEXIDINE GLUCONATE 0.12 % MT SOLN: 15.0000 mL | Freq: Once | OROMUCOSAL | Status: AC

## 2022-08-29 MED ORDER — SUGAMMADEX SODIUM 200 MG/2ML IV SOLN
INTRAVENOUS | Status: DC | PRN
  Administered 2022-08-29: 140 mg via INTRAVENOUS

## 2022-08-29 MED ORDER — GEMCITABINE CHEMO FOR BLADDER INSTILLATION 2000 MG
INTRAVENOUS | Status: DC | PRN
  Administered 2022-08-29: 4000 mg via INTRAVESICAL

## 2022-08-29 MED ORDER — OXYCODONE-ACETAMINOPHEN 5-325 MG PO TABS: 1.0000 | ORAL_TABLET | ORAL | 0 refills | Status: DC | PRN

## 2022-08-29 MED ORDER — LACTATED RINGERS IV BOLUS
500.0000 mL | Freq: Once | INTRAVENOUS | Status: AC
  Administered 2022-08-29: 500 mL via INTRAVENOUS

## 2022-08-29 MED ORDER — MIDAZOLAM HCL 2 MG/2ML IJ SOLN
2.0000 mg | Freq: Once | INTRAMUSCULAR | Status: AC
  Administered 2022-08-29: 2 mg via INTRAVENOUS

## 2022-08-29 MED ORDER — DIATRIZOATE MEGLUMINE 30 % UR SOLN
URETHRAL | Status: DC | PRN
  Administered 2022-08-29: 10 mL via URETHRAL

## 2022-08-29 MED ORDER — LIDOCAINE HCL (PF) 2 % IJ SOLN
INTRAMUSCULAR | Status: AC
  Filled 2022-08-29: qty 5

## 2022-08-29 MED ORDER — FENTANYL CITRATE (PF) 100 MCG/2ML IJ SOLN
INTRAMUSCULAR | Status: DC | PRN
  Administered 2022-08-29: 25 ug via INTRAVENOUS
  Administered 2022-08-29: 50 ug via INTRAVENOUS
  Administered 2022-08-29: 25 ug via INTRAVENOUS

## 2022-08-29 MED ORDER — STERILE WATER FOR IRRIGATION IR SOLN
Status: DC | PRN
  Administered 2022-08-29: 500 mL

## 2022-08-29 MED ORDER — ORAL CARE MOUTH RINSE: 15.0000 mL | Freq: Once | OROMUCOSAL | Status: AC

## 2022-08-29 MED ORDER — CHLORHEXIDINE GLUCONATE 0.12 % MT SOLN
OROMUCOSAL | Status: AC
  Administered 2022-08-29: 15 mL via OROMUCOSAL
  Filled 2022-08-29: qty 15

## 2022-08-29 MED ORDER — ONDANSETRON HCL 4 MG/2ML IJ SOLN
INTRAMUSCULAR | Status: DC | PRN
  Administered 2022-08-29: 4 mg via INTRAVENOUS

## 2022-08-29 MED ORDER — OXYCODONE-ACETAMINOPHEN 5-325 MG PO TABS
1.0000 | ORAL_TABLET | Freq: Once | ORAL | Status: AC
  Administered 2022-08-29: 1 via ORAL

## 2022-08-29 MED ORDER — GEMCITABINE CHEMO FOR BLADDER INSTILLATION 2000 MG
2000.0000 mg | Freq: Once | INTRAVENOUS | Status: DC
  Filled 2022-08-29: qty 52.6

## 2022-08-29 MED ORDER — MIDAZOLAM HCL 5 MG/5ML IJ SOLN
INTRAMUSCULAR | Status: DC | PRN
  Administered 2022-08-29: 2 mg via INTRAVENOUS

## 2022-08-29 MED ORDER — LACTATED RINGERS IV SOLN: INTRAVENOUS | Status: DC

## 2022-08-29 MED ORDER — ROCURONIUM 10MG/ML (10ML) SYRINGE FOR MEDFUSION PUMP - OPTIME
INTRAVENOUS | Status: DC | PRN
  Administered 2022-08-29: 40 mg via INTRAVENOUS

## 2022-08-29 MED ORDER — DEXTROSE 5 % IV SOLN
5.0000 mg/kg | INTRAVENOUS | Status: AC
  Administered 2022-08-29: 199.6 mg via INTRAVENOUS
  Filled 2022-08-29: qty 5

## 2022-08-29 MED ORDER — LIDOCAINE HCL (CARDIAC) PF 50 MG/5ML IV SOSY
PREFILLED_SYRINGE | INTRAVENOUS | Status: DC | PRN
  Administered 2022-08-29: 50 mg via INTRAVENOUS

## 2022-08-29 MED ORDER — HYDROMORPHONE HCL 1 MG/ML IJ SOLN
0.2500 mg | INTRAMUSCULAR | Status: DC | PRN
  Administered 2022-08-29: 0.5 mg via INTRAVENOUS
  Filled 2022-08-29: qty 0.5

## 2022-08-29 MED ORDER — DIATRIZOATE MEGLUMINE 30 % UR SOLN
URETHRAL | Status: AC
  Filled 2022-08-29: qty 100

## 2022-08-29 MED ORDER — ONDANSETRON HCL 4 MG/2ML IJ SOLN
4.0000 mg | Freq: Once | INTRAMUSCULAR | Status: AC | PRN
  Administered 2022-08-29: 4 mg via INTRAVENOUS
  Filled 2022-08-29: qty 2

## 2022-08-29 MED ORDER — OXYCODONE-ACETAMINOPHEN 5-325 MG PO TABS
ORAL_TABLET | ORAL | Status: AC
  Filled 2022-08-29: qty 1

## 2022-08-29 SURGICAL SUPPLY — 29 items
BAG DRAIN URO TABLE W/ADPT NS (BAG) ×3 IMPLANT
BAG DRN 8 ADPR NS SKTRN CSTL (BAG) ×2
BAG DRN RND TRDRP ANRFLXCHMBR (UROLOGICAL SUPPLIES) ×2
BAG HAMPER (MISCELLANEOUS) ×3 IMPLANT
BAG URINE DRAIN 2000ML AR STRL (UROLOGICAL SUPPLIES) ×3 IMPLANT
CATH FOLEY 3WAY 30CC 22F (CATHETERS) IMPLANT
CATH INTERMIT  6FR 70CM (CATHETERS) ×3 IMPLANT
CLOTH BEACON ORANGE TIMEOUT ST (SAFETY) ×3 IMPLANT
DECANTER SPIKE VIAL GLASS SM (MISCELLANEOUS) ×3 IMPLANT
ELECT LOOP 22F BIPOLAR SML (ELECTROSURGICAL) ×2
ELECTRODE LOOP 22F BIPOLAR SML (ELECTROSURGICAL) ×3 IMPLANT
GLOVE BIO SURGEON STRL SZ8 (GLOVE) ×3 IMPLANT
GLOVE BIOGEL PI IND STRL 7.0 (GLOVE) ×6 IMPLANT
GLOVE ECLIPSE 6.5 STRL STRAW (GLOVE) IMPLANT
GLOVE ECLIPSE 7.0 STRL STRAW (GLOVE) IMPLANT
GOWN STRL REUS W/TWL LRG LVL3 (GOWN DISPOSABLE) ×3 IMPLANT
GOWN STRL REUS W/TWL XL LVL3 (GOWN DISPOSABLE) ×3 IMPLANT
GUIDEWIRE STR DUAL SENSOR (WIRE) IMPLANT
IV NS IRRIG 3000ML ARTHROMATIC (IV SOLUTION) ×6 IMPLANT
KIT TURNOVER CYSTO (KITS) ×3 IMPLANT
MANIFOLD NEPTUNE II (INSTRUMENTS) ×3 IMPLANT
PACK CYSTO (CUSTOM PROCEDURE TRAY) ×3 IMPLANT
PAD ARMBOARD 7.5X6 YLW CONV (MISCELLANEOUS) ×3 IMPLANT
PLUG CATH AND CAP STER (CATHETERS) IMPLANT
SYR 30ML LL (SYRINGE) ×3 IMPLANT
SYR TOOMEY IRRIG 70ML (MISCELLANEOUS) ×2
SYRINGE TOOMEY IRRIG 70ML (MISCELLANEOUS) ×3 IMPLANT
TOWEL OR 17X26 4PK STRL BLUE (TOWEL DISPOSABLE) ×3 IMPLANT
WATER STERILE IRR 500ML POUR (IV SOLUTION) ×3 IMPLANT

## 2022-08-29 NOTE — H&P (Signed)
Bladder mass     HPI: Ms Roubideaux is a 67yo here for evaluation of a bladder mass. She underwent CT in December 2023 which showed a 4-5cm bladder mass. She has a remote hx of gross hematuria. She has a 50pk year smoking hx.      PMH:     Past Medical History:  Diagnosis Date   AC (acromioclavicular) joint bone spurs     Anxiety     DDD (degenerative disc disease), lumbar     Scoliosis        Surgical History:      Past Surgical History:  Procedure Laterality Date   CESAREAN SECTION          Home Medications:  Allergies as of 07/29/2022         Reactions    Penicillins Shortness Of Breath, Swelling    Has patient had a PCN reaction causing immediate rash, facial/tongue/throat swelling, SOB or lightheadedness with hypotension: Yes Has patient had a PCN reaction causing severe rash involving mucus membranes or skin necrosis: No Has patient had a PCN reaction that required hospitalization: Yes Has patient had a PCN reaction occurring within the last 10 years: No If all of the above answers are "NO", then may proceed with Cephalosporin use.    Aspirin      REACTION: dizzy,weak and upset stomach    Demerol [meperidine Hcl] Other (See Comments)    Altered mental status-anger, hallucinations    Sulfonamide Derivatives Nausea And Vomiting            Medication List           Accurate as of July 29, 2022  2:25 PM. If you have any questions, ask your nurse or doctor.              acetaminophen 500 MG tablet Commonly known as: TYLENOL Take 1,000 mg by mouth every 4 (four) hours as needed for mild pain or moderate pain.    amLODipine 2.5 MG tablet Commonly known as: NORVASC Take 2.5 mg by mouth daily.    diazepam 10 MG tablet Commonly known as: VALIUM Take 10 mg by mouth 4 (four) times daily as needed for anxiety.    losartan 25 MG tablet Commonly known as: COZAAR Take 25 mg by mouth daily.             Allergies:       Allergies  Allergen Reactions    Penicillins Shortness Of Breath and Swelling      Has patient had a PCN reaction causing immediate rash, facial/tongue/throat swelling, SOB or lightheadedness with hypotension: Yes Has patient had a PCN reaction causing severe rash involving mucus membranes or skin necrosis: No Has patient had a PCN reaction that required hospitalization: Yes Has patient had a PCN reaction occurring within the last 10 years: No If all of the above answers are "NO", then may proceed with Cephalosporin use.     Aspirin        REACTION: dizzy,weak and upset stomach   Demerol [Meperidine Hcl] Other (See Comments)      Altered mental status-anger, hallucinations   Sulfonamide Derivatives Nausea And Vomiting      Family History:      Family History  Problem Relation Age of Onset   Aneurysm Father     Kidney failure Mother     Hypertension Sister     Thyroid disease Sister     Anxiety disorder Sister     Colitis Sister  Irritable bowel syndrome Sister        Social History:  reports that she has been smoking cigarettes. She has a 40.00 pack-year smoking history. She has never used smokeless tobacco. She reports current alcohol use. She reports that she does not currently use drugs.   ROS: All other review of systems were reviewed and are negative except what is noted above in HPI   Physical Exam: BP (!) 143/82   Pulse 88   Constitutional:  Alert and oriented, No acute distress. HEENT: [place] AT, moist mucus membranes.  Trachea midline, no masses. Cardiovascular: No clubbing, cyanosis, or edema. Respiratory: Normal respiratory effort, no increased work of breathing. GI: Abdomen is soft, nontender, nondistended, no abdominal masses GU: No CVA tenderness.  Lymph: No cervical or inguinal lymphadenopathy. Skin: No rashes, bruises or suspicious lesions. Neurologic: Grossly intact, no focal deficits, moving all 4 extremities. Psychiatric: Normal mood and affect.   Laboratory Data: Recent Labs        Lab Results  Component Value Date    WBC 7.9 02/19/2022    HGB 14.3 02/19/2022    HCT 43.5 02/19/2022    MCV 91.6 02/19/2022    PLT 241 02/19/2022        Recent Labs       Lab Results  Component Value Date    CREATININE 1.00 06/19/2022        Recent Labs  No results found for: "PSA"     Recent Labs  No results found for: "TESTOSTERONE"     Recent Labs  No results found for: "HGBA1C"     Urinalysis Labs (Brief)          Component Value Date/Time    COLORURINE AMBER (A) 02/19/2022 1730    APPEARANCEUR HAZY (A) 02/19/2022 1730    LABSPEC 1.009 02/19/2022 1730    PHURINE 6.0 02/19/2022 1730    GLUCOSEU NEGATIVE 02/19/2022 1730    HGBUR MODERATE (A) 02/19/2022 1730    BILIRUBINUR NEGATIVE 02/19/2022 1730    BILIRUBINUR negative 02/15/2022 1325    KETONESUR NEGATIVE 02/19/2022 1730    PROTEINUR 100 (A) 02/19/2022 1730    UROBILINOGEN 0.2 02/15/2022 1325    UROBILINOGEN 0.2 04/05/2010 0558    NITRITE NEGATIVE 02/19/2022 1730    LEUKOCYTESUR NEGATIVE 02/19/2022 1730        Recent Labs       Lab Results  Component Value Date    BACTERIA RARE (A) 02/19/2022        Pertinent Imaging: CT 12/13/023:  No results found for this or any previous visit.   No results found for this or any previous visit.   No results found for this or any previous visit.   No results found for this or any previous visit.   No results found for this or any previous visit.   No valid procedures specified. No results found for this or any previous visit.   No results found for this or any previous visit.     Assessment & Plan:     1.  Bladder mass We discussed the management of bladder tumors including transurethral resection and after discussing the procedure the patient elects to proceed with surgery. Risks/benefits/alternatives to transurethral resection of the bladder tumor was explained to the patient

## 2022-08-29 NOTE — Anesthesia Preprocedure Evaluation (Signed)
Anesthesia Evaluation  Patient identified by MRN, date of birth, ID band Patient awake    Reviewed: Allergy & Precautions, H&P , NPO status , Patient's Chart, lab work & pertinent test results  Airway Mallampati: III  TM Distance: >3 FB Neck ROM: Full   Comment: Neck fx Dental  (+) Dental Advisory Given, Missing   Pulmonary asthma , COPD, Current Smoker and Patient abstained from smoking.   Pulmonary exam normal breath sounds clear to auscultation       Cardiovascular Exercise Tolerance: Poor hypertension, Pt. on medications  Rhythm:Regular Rate:Normal + Systolic murmurs [id number] 15:34:38 [facility] System-AP-300 ROUTINE RECORD [id number] (35 yr) Female Caucasian Vent. rate 78 BPM PR interval 182 ms QRS duration 80 ms QT/QTcB 388/442 ms P-R-T axes 83 82 77 Normal sinus rhythm Normal ECG When compared with ECG of 05-Apr-2010 06:41,no lphb PREVIOUS ECG IS PRESENT Confirmed by Asencion Noble [phone]) on 08/28/2022 7:55:01 PM   Neuro/Psych  PSYCHIATRIC DISORDERS Anxiety     negative neurological ROS     GI/Hepatic negative GI ROS, Neg liver ROS,,,  Endo/Other  negative endocrine ROS    Renal/GU negative Renal ROS Bladder dysfunction (bladder tumor)      Musculoskeletal  (+) Arthritis , Osteoarthritis,    Abdominal   Peds negative pediatric ROS (+)  Hematology  (+) Blood dyscrasia, anemia   Anesthesia Other Findings Neck fx IMPRESSION: 1. 4.4 by 2.5 by 4.2 cm irregular and lobulated enhancing mass of the roof of the urinary bladder, compatible with malignancy. This has a component extending intraluminally although a substantial portion appears to be intramural. I do not see invasion of the adjacent uterus or bowel although micro extension beyond the urinary bladder wall is not totally excluded. No pathologic adenopathy or findings of metastatic disease. 2. Mildly progressive compression fracture at L3  with some sclerosis along the superior endplate indicating subacute component. Remote compression fractures at T12, L1, and L2. 3. Stable prominence of the common hepatic duct up to 1.3 cm in diameter, tapering distally, not changed from 2011, no intrahepatic biliary dilatation, suspicious for a choledochocele. 4. Mitral and aortic valve calcifications. Coronary atherosclerosis. 5. Bosniak category 1 cyst of the right kidney lower pole. No further imaging workup of this lesion is indicated.   Aortic Atherosclerosis (ICD10-I70.0).     Electronically Signed   By: Van Clines M.D.   On: 06/19/2022 18:26   Reproductive/Obstetrics negative OB ROS                              Anesthesia Physical Anesthesia Plan  ASA: 3  Anesthesia Plan: General   Post-op Pain Management: Dilaudid IV   Induction: Intravenous  PONV Risk Score and Plan: 4 or greater and Ondansetron, Dexamethasone and Midazolam  Airway Management Planned: Oral ETT  Additional Equipment:   Intra-op Plan:   Post-operative Plan: Extubation in OR  Informed Consent: I have reviewed the patients History and Physical, chart, labs and discussed the procedure including the risks, benefits and alternatives for the proposed anesthesia with the patient or authorized representative who has indicated his/her understanding and acceptance.     Dental advisory given  Plan Discussed with: CRNA and Surgeon  Anesthesia Plan Comments:          Anesthesia Quick Evaluation

## 2022-08-29 NOTE — Progress Notes (Signed)
Pt cath drained in PACU at this time, 279m output noted and in chart. Pt tolerated well

## 2022-08-29 NOTE — Transfer of Care (Signed)
Immediate Anesthesia Transfer of Care Note  Patient: Raven Lopez  Procedure(s) Performed: CYSTOSCOPY WITH RETROGRADE PYELOGRAM (Bilateral: Urethra) TRANSURETHRAL RESECTION OF BLADDER TUMOR (TURBT) (Bladder) BLADDER INSTILLATION (Bladder)  Patient Location: PACU  Anesthesia Type:General  Level of Consciousness: awake  Airway & Oxygen Therapy: Patient Spontanous Breathing  Post-op Assessment: Report given to RN  Post vital signs: Reviewed and stable  Last Vitals:  Vitals Value Taken Time  BP 159/82 08/29/22 1005  Temp    Pulse 75 08/29/22 1008  Resp 11 08/29/22 1008  SpO2 99 % 08/29/22 1008  Vitals shown include unvalidated device data.  Last Pain:  Vitals:   08/29/22 0751  TempSrc: Oral  PainSc: 0-No pain         Complications: No notable events documented.

## 2022-08-29 NOTE — Anesthesia Procedure Notes (Addendum)
Procedure Name: Intubation Date/Time: 08/29/2022 9:06 AM  Performed by: Ollen Bowl, CRNAPre-anesthesia Checklist: Patient identified, Patient being monitored, Timeout performed, Emergency Drugs available and Suction available Patient Re-evaluated:Patient Re-evaluated prior to induction Oxygen Delivery Method: Circle System Utilized Preoxygenation: Pre-oxygenation with 100% oxygen Induction Type: IV induction Ventilation: Mask ventilation without difficulty Laryngoscope Size: Mac and 3 Grade View: Grade I Tube type: Oral Tube size: 7.0 mm Number of attempts: 1 Airway Equipment and Method: stylet Placement Confirmation: ETT inserted through vocal cords under direct vision, positive ETCO2 and breath sounds checked- equal and bilateral Secured at: 21 cm Tube secured with: Tape Dental Injury: Teeth and Oropharynx as per pre-operative assessment

## 2022-08-29 NOTE — Op Note (Signed)
.  Preoperative diagnosis: bladder tumor  Postoperative diagnosis: Same  Procedure: 1 cystoscopy 2. bilateral retrograde pyelography 3.  Intraoperative fluoroscopy, under one hour, with interpretation 4. Transurethral resection of bladder tumor, large 5. Installation of chemotherapy agent into bladder  Attending: Nicolette Bang  Anesthesia: General  Estimated blood loss: Minimal  Drains: 22 French foley  Specimens: bladder tumor  Antibiotics: gentamicin  Findings: 6cm sessile right posterior wall tumor.  Ureteral orifices in normal anatomic location. No hydronephrosis or filling defects in either collecting system  Indications: Patient is a 67 year old female with a history of bladder tumor and gross hematuria.  After discussing treatment options, they decided proceed with transurethral resection of a bladder tumor.  Procedure in detail: The patient was brought to the operating room and a brief timeout was done to ensure correct patient, correct procedure, correct site.  General anesthesia was administered patient was placed in dorsal lithotomy position.  Their genitalia was then prepped and draped in usual sterile fashion.  A rigid 80 French cystoscope was passed in the urethra and the bladder.  Bladder was inspected and we noted a 6cm bladder tumor.  the ureteral orifices were in the normal orthotopic locations.  a 6 french ureteral catheter was then instilled into the left ureteral orifice.  a gentle retrograde was obtained and findings noted above. We then turned our attention to the right side. a 6 french ureteral catheter was then instilled into the right ureteral orifice.  a gentle retrograde was obtained and findings noted above. We then removed the cystoscope and placed a resectoscope into the bladder. Using the bipolar resectoscope we removed the bladder tumor down to the base. A subsequent muscle deep biopsy was then taken. Hemostasis was then obtained with electrocautery. We  then removed the bladder tumor chips and sent them for pathology. We then re-inspected the bladder and found no residual bleeding.  the bladder was then drained, a 22 French foley was placed and 2gm gemcitibine was installed in the bladder and capped. This concluded the procedure which was well tolerated by patient.  Complications: None  Condition: Stable, extubated, transferred to PACU  Plan: Patient is to remain in [place] for 1 hour and then the gemcitibine will be drained after 1 hour. She will be discharged home and followup in 5 days for foley catheter removal and pathology discussion.

## 2022-08-29 NOTE — Anesthesia Postprocedure Evaluation (Signed)
Anesthesia Post Note  Patient: Raven Lopez  Procedure(s) Performed: CYSTOSCOPY WITH RETROGRADE PYELOGRAM (Bilateral: Urethra) TRANSURETHRAL RESECTION OF BLADDER TUMOR (TURBT) (Bladder) BLADDER INSTILLATION (Bladder)  Patient location during evaluation: Phase II Anesthesia Type: General Level of consciousness: awake and alert and oriented Pain management: pain level controlled Vital Signs Assessment: post-procedure vital signs reviewed and stable Respiratory status: spontaneous breathing, nonlabored ventilation and respiratory function stable Cardiovascular status: blood pressure returned to baseline and stable Postop Assessment: no apparent nausea or vomiting Anesthetic complications: no  No notable events documented.   Last Vitals:  Vitals:   08/29/22 1030 08/29/22 1100  BP: (!) 165/91 (!) 156/85  Pulse: 67 73  Resp: 12 16  Temp:    SpO2: 98% 98%    Last Pain:  Vitals:   08/29/22 1100  TempSrc:   PainSc: 4                  Kire Ferg C Jaquae Rieves

## 2022-08-30 LAB — SURGICAL PATHOLOGY

## 2022-09-04 ENCOUNTER — Encounter: Payer: Self-pay | Admitting: Urology

## 2022-09-04 ENCOUNTER — Ambulatory Visit (INDEPENDENT_AMBULATORY_CARE_PROVIDER_SITE_OTHER): Payer: Medicare Other | Admitting: Urology

## 2022-09-04 VITALS — BP 122/79 | HR 79

## 2022-09-04 DIAGNOSIS — C679 Malignant neoplasm of bladder, unspecified: Secondary | ICD-10-CM

## 2022-09-04 NOTE — Patient Instructions (Signed)

## 2022-09-04 NOTE — Progress Notes (Signed)
Fill and Pull Catheter Removal  Patient is present today for a catheter removal.  Patient was cleaned and prepped in a sterile fashion 161m of sterile water/ saline was instilled into the bladder when the patient felt the urge to urinate. 271mof water was then drained from the balloon.  A 22FR foley cath was removed from the bladder no complications were noted .  Patient as then given some time to void on their own.  Patient can void  12527mn their own after some time.  Patient tolerated well.  Performed by: ShaMarisue BrooklynMA  Follow up/ Additional notes: Keep F/U appt

## 2022-09-04 NOTE — Progress Notes (Signed)
09/04/2022 9:46 AM   Raven Lopez 1956/05/29 [id number]  Referring provider: Redmond School, MD [address] [place],  [place] [id number]  Followup bladder tumor   HPI: [name] is a [phone] here for followup after bladder tumor resection. Pathology high grade infiltrating T2 bladder cancer. Voiding trial passed today.    PMH: Past Medical History:  Diagnosis Date   AC (acromioclavicular) joint bone spurs    Anxiety    Asthma    COPD (chronic obstructive pulmonary disease) (HCC)    DDD (degenerative disc disease), lumbar    Hypertension    Neck fracture ([place])    Panic disorder    Scoliosis     Surgical History: Past Surgical History:  Procedure Laterality Date   CESAREAN SECTION     wisdom teeth removal      Home Medications:  Allergies as of 09/04/2022       Reactions   Penicillins Shortness Of Breath, Swelling   Has patient had a PCN reaction causing immediate rash, facial/tongue/throat swelling, SOB or lightheadedness with hypotension: Yes Has patient had a PCN reaction causing severe rash involving mucus membranes or skin necrosis: No Has patient had a PCN reaction that required hospitalization: Yes Has patient had a PCN reaction occurring within the last 10 years: No If all of the above answers are "NO", then may proceed with Cephalosporin use.   Aspirin    REACTION: dizzy,weak and upset stomach   Demerol [meperidine Hcl] Other (See Comments)   Altered mental status-anger, hallucinations   Sulfonamide Derivatives Nausea And Vomiting        Medication List        Accurate as of September 04, 2022  9:46 AM. If you have any questions, ask your nurse or doctor.          acetaminophen 500 MG tablet Commonly known as: TYLENOL Take 1,000 mg by mouth every 4 (four) hours as needed for mild pain or moderate pain.   amLODipine 10 MG tablet Commonly known as: NORVASC Take 10 mg by mouth at bedtime.   diazepam 10 MG tablet Commonly known as:  VALIUM Take 10 mg by mouth in the morning and at bedtime.   losartan 25 MG tablet Commonly known as: COZAAR Take 25 mg by mouth every evening.   oxyCODONE-acetaminophen 5-325 MG tablet Commonly known as: Percocet Take 1 tablet by mouth every 4 (four) hours as needed for severe pain.        Allergies:  Allergies  Allergen Reactions   Penicillins Shortness Of Breath and Swelling    Has patient had a PCN reaction causing immediate rash, facial/tongue/throat swelling, SOB or lightheadedness with hypotension: Yes Has patient had a PCN reaction causing severe rash involving mucus membranes or skin necrosis: No Has patient had a PCN reaction that required hospitalization: Yes Has patient had a PCN reaction occurring within the last 10 years: No If all of the above answers are "NO", then may proceed with Cephalosporin use.    Aspirin     REACTION: dizzy,weak and upset stomach   Demerol [Meperidine Hcl] Other (See Comments)    Altered mental status-anger, hallucinations   Sulfonamide Derivatives Nausea And Vomiting    Family History: Family History  Problem Relation Age of Onset   Aneurysm Father    Kidney failure Mother    Hypertension Sister    Thyroid disease Sister    Anxiety disorder Sister    Colitis Sister    Irritable bowel syndrome Sister  Social History:  reports that she has been smoking cigarettes. She has a 40.00 pack-year smoking history. She has never used smokeless tobacco. She reports current alcohol use. She reports that she does not currently use drugs.  ROS: All other review of systems were reviewed and are negative except what is noted above in HPI  Physical Exam: BP 122/79   Pulse 79   Constitutional:  Alert and oriented, No acute distress. HEENT: [place] AT, moist mucus membranes.  Trachea midline, no masses. Cardiovascular: No clubbing, cyanosis, or edema. Respiratory: Normal respiratory effort, no increased work of breathing. GI: Abdomen is soft,  nontender, nondistended, no abdominal masses GU: No CVA tenderness.  Lymph: No cervical or inguinal lymphadenopathy. Skin: No rashes, bruises or suspicious lesions. Neurologic: Grossly intact, no focal deficits, moving all 4 extremities. Psychiatric: Normal mood and affect.  Laboratory Data: Lab Results  Component Value Date   WBC 10.4 08/27/2022   HGB 14.6 08/27/2022   HCT 45.4 08/27/2022   MCV 92.8 08/27/2022   PLT 259 08/27/2022    Lab Results  Component Value Date   CREATININE 1.10 (H) 08/27/2022    No results found for: "PSA"  No results found for: "TESTOSTERONE"  No results found for: "HGBA1C"  Urinalysis    Component Value Date/Time   COLORURINE AMBER (A) 02/19/2022 1730   APPEARANCEUR Cloudy (A) 07/29/2022 1357   LABSPEC 1.009 02/19/2022 1730   PHURINE 6.0 02/19/2022 1730   GLUCOSEU Negative 07/29/2022 1357   HGBUR MODERATE (A) 02/19/2022 1730   BILIRUBINUR Negative 07/29/2022 1357   KETONESUR NEGATIVE 02/19/2022 1730   PROTEINUR 3+ (A) 07/29/2022 1357   PROTEINUR 100 (A) 02/19/2022 1730   UROBILINOGEN 0.2 02/15/2022 1325   UROBILINOGEN 0.2 04/05/2010 0558   NITRITE Negative 07/29/2022 1357   NITRITE NEGATIVE 02/19/2022 1730   LEUKOCYTESUR Negative 07/29/2022 1357   LEUKOCYTESUR NEGATIVE 02/19/2022 1730    Lab Results  Component Value Date   LABMICR See below: 07/29/2022   WBCUA 0-5 07/29/2022   LABEPIT 0-10 07/29/2022   BACTERIA None seen 07/29/2022    Pertinent Imaging: *** No results found for this or any previous visit.  No results found for this or any previous visit.  No results found for this or any previous visit.  No results found for this or any previous visit.  No results found for this or any previous visit.  No valid procedures specified. No results found for this or any previous visit.  No results found for this or any previous visit.   Assessment & Plan:    1. Malignant neoplasm of urinary bladder, unspecified site  ([place]) -We discussed the natural history of muscle invasive bladder cancer and the various treatment options including surveillance with symptomatic resection, radical cystectomy, and bladder sparing chemotherapy/radiation. She will be referred to Dr. Tresa Moore at [facility] for consideration of radical cystectomy as well as Dr. Neita Garnet. Delton Coombes for consideration of radiation/chemotherapy - Bladder Voiding Trial   No follow-ups on file.  Nicolette Bang, MD  [facility] [place]

## 2022-09-05 ENCOUNTER — Telehealth: Payer: Self-pay | Admitting: Radiation Oncology

## 2022-09-05 ENCOUNTER — Encounter (HOSPITAL_COMMUNITY): Payer: Self-pay | Admitting: Urology

## 2022-09-05 NOTE — Telephone Encounter (Signed)
2/29 @ 8:37 am Left voicemail on patient and patient's son # for patient to call our office to be schedule for consult.

## 2022-09-06 ENCOUNTER — Telehealth: Payer: Self-pay | Admitting: Radiation Oncology

## 2022-09-06 NOTE — Telephone Encounter (Signed)
3/1 @ 8:34 am Left voicemail for patient to call our office to be schedule for an consult.

## 2022-09-09 NOTE — Progress Notes (Signed)
GU Location of Tumor / Histology: Malignant neoplasm of urinary bladder  06/19/2022 Dr. Nicholes Mango CT Abdomen Pelvis with/without Contrast CLINICAL DATA:  Gross hematuria since September 2023. Bladder tumor.  * Tracking Code: BO *  FINDINGS: Lower chest: Mitral and aortic valve calcifications. Descending thoracic aortic and right coronary artery atherosclerotic vascular calcification.   Hepatobiliary: Stable prominence of the common hepatic duct up to 1.3 cm in diameter, tapering distally, not changed from 2011, no intrahepatic biliary dilatation, suspicious for a choledochocele.   Nonspecific 3 mm hypodense lesion posteriorly in the right hepatic lobe on image 30 series 8, probably benign/incidental.   Contracted gallbladder.   Pancreas: Unremarkable   Spleen: Unremarkable   Adrenals/Urinary Tract: Punctate vascular calcifications in both renal hila. 2.8 by 2.4 cm right kidney lower pole Bosniak category 1 cyst. No further imaging workup of this lesion is indicated.   A sharply defined 0.7 by 0.5 cm hypodense lesion of the right kidney upper pole is too small to characterize although statistically highly likely to be a benign cyst. No further imaging workup of this lesion is indicated.   Irregular and lobulated enhancing 4.4 by 2.5 by 4.2 cm mass of the roof of the urinary bladder. This has a component extending intraluminally although a substantial portion appears to be intramural. I do not see invasion of the adjacent uterus or bowel although micro extension beyond the urinary bladder wall is not totally excluded.   The adrenal glands appear normal.   Stomach/Bowel: Unremarkable   Vascular/Lymphatic: Substantial atherosclerosis is present, including aortoiliac atherosclerotic disease. There is some mild atheromatous narrowing at the origin of the celiac trunk, the celiac trunk and SMA appear patent. No pathologic adenopathy identified.   Reproductive: Unremarkable    Other: No compelling findings of mesenteric or omental spread of tumor. No ascites.   Musculoskeletal: Mildly progressive compression fracture at L3 with some sclerosis along the superior endplate compression indicating subacute component. There is about 45% loss of L3 vertebral body height.   Remote compression fractures at T12, L1, and L2 along the superior endplates. Associated Schmorl's nodes noted. No substantial lumbar impingement observed. No compelling findings of osseous metastatic disease.   IMPRESSION: 1. 4.4 by 2.5 by 4.2 cm irregular and lobulated enhancing mass of the roof of the urinary bladder, compatible with malignancy. This has a component extending intraluminally although a substantial portion appears to be intramural. I do not see invasion of the adjacent uterus or bowel although micro extension beyond the urinary bladder wall is not totally excluded. No pathologic adenopathy or findings of metastatic disease. 2. Mildly progressive compression fracture at L3 with some sclerosis along the superior endplate indicating subacute component. Remote compression fractures at T12, L1, and L2. 3. Stable prominence of the common hepatic duct up to 1.3 cm in diameter, tapering distally, not changed from 2011, no intrahepatic biliary dilatation, suspicious for a choledochocele. 4. Mitral and aortic valve calcifications. Coronary atherosclerosis. 5. Bosniak category 1 cyst of the right kidney lower pole. No further imaging workup of this lesion is indicated.   Aortic Atherosclerosis (ICD10-I70.0).   Biopsy:  High grade infiltrating T2 bladder cancer   07/29/2009 Dr. Nicholes Mango CT Abdomen Pelvis with/without Contrast Clinical Data: Recurrent hematuria  IMPRESSION:  1.  No renal calculi.  No hydronephrosis.  No renal mass.  2.  No definite bladder lesion is seen on delayed images.  3.  The common bile duct is dilated up to 12 mm in diameter.  Consider a noncalcified  distal  common bile duct calculus,  stricture, or mass.  Correlate with LFTs and consider MRCP or ERCP  to assess further.     Past/Anticipated interventions by urology, if any:   Next appointment:  09/30/2022, Dr. Nicolette Bang at 2:30 pm.  Past/Anticipated interventions by medical oncology, if any: NA  Weight changes, if any: {:18581}  Bowel/Bladder complaints, if any: {:18581}   Nausea/Vomiting, if any: {:18581}  Pain issues, if any:  {:18581}  SAFETY ISSUES: Prior radiation? {:18581} Pacemaker/ICD? {:18581} Possible current pregnancy? Postmenopausal Is the patient on methotrexate? No  Current Complaints / other details:

## 2022-09-12 ENCOUNTER — Encounter: Payer: Self-pay | Admitting: Radiation Oncology

## 2022-09-12 ENCOUNTER — Other Ambulatory Visit: Payer: Self-pay

## 2022-09-12 ENCOUNTER — Ambulatory Visit
Admission: RE | Admit: 2022-09-12 | Discharge: 2022-09-12 | Disposition: A | Discharge status: Home / Self Care | Payer: Medicare Other | Source: Ambulatory Visit | Attending: Radiation Oncology | Admitting: Radiation Oncology

## 2022-09-12 VITALS — Ht 62.0 in | Wt 88.0 lb

## 2022-09-12 DIAGNOSIS — C679 Malignant neoplasm of bladder, unspecified: Secondary | ICD-10-CM | POA: Diagnosis not present

## 2022-09-12 NOTE — Addendum Note (Signed)
Encounter addended by: Sharol Given, RN on: 09/12/2022 8:47 AM  Actions taken: External Videoconference Connected, External Videoconference Disconnected

## 2022-09-12 NOTE — Progress Notes (Signed)
Radiation Oncology         [phone] ________________________________  Initial outpatient Consultation-  - Conducted via MyChart video due to current [id number] concerns for limiting patient exposure  Name: MELVINIA WILLERS MRN: [id number]  Date of Service: 09/12/2022 DOB: 07/02/1956  [id number], Purcell Nails, MD  McKenzie, Candee Furbish, MD   REFERRING PHYSICIAN: Alyson Ingles Candee Furbish, MD  DIAGNOSIS: 67 year old woman with newly diagnosed muscle invasive bladder cancer.    ICD-10-CM   1. Malignant neoplasm of urinary bladder, unspecified site [facility])  C67.9       HISTORY OF PRESENT ILLNESS: COLISTA MCCROBIE is a 67 y.o. female seen at the request of Dr. Alyson Ingles.  She presented to her PCP, Dr. Gerarda Fraction in December 2023 with complaints of intermittent gross hematuria ongoing since September 2023.  He ordered a CT A/P which was performed on 06/19/2022 and showed a 4.4 cm tumor in the roof of the bladder measuring 4.4 x 2.5 x 4.2 cm with a portion appearing to extend intraluminally without any associated lymphadenopathy.  She was referred to Dr. Alyson Ingles on 07/29/2022 and had an in office cystoscopy that confirmed a 6 cm sessile mass on the posterior wall and trigone of the bladder.  A TURBT procedure was performed on 08/29/2022 with gemcitabine instillation.  Final surgical pathology confirmed high-grade infiltrative urothelial carcinoma invading the muscularis propria.  She has been kindly referred to [place] today to discuss the potential radiotherapy options in the management of her disease.  She has also been referred to Dr. Tresa Moore to discuss radical cystectomy and Dr. Delton Coombes to discuss systemic therapy options but does not yet have any scheduled consult visits with them.  PREVIOUS RADIATION THERAPY: No  PAST MEDICAL HISTORY:  Past Medical History:  Diagnosis Date   AC (acromioclavicular) joint bone spurs    Anxiety    Asthma    COPD (chronic obstructive pulmonary disease) (HCC)    DDD (degenerative disc  disease), lumbar    Hypertension    Neck fracture ([place])    Panic disorder    Scoliosis       PAST SURGICAL HISTORY: Past Surgical History:  Procedure Laterality Date   BLADDER INSTILLATION N/A 08/29/2022   Procedure: BLADDER INSTILLATION;  Surgeon: Cleon Gustin, MD;  Location: [facility];  Service: Urology;  Laterality: N/A;   CESAREAN SECTION     CYSTOSCOPY W/ RETROGRADES Bilateral 08/29/2022   Procedure: CYSTOSCOPY WITH RETROGRADE PYELOGRAM;  Surgeon: Cleon Gustin, MD;  Location: [facility];  Service: Urology;  Laterality: Bilateral;   TRANSURETHRAL RESECTION OF BLADDER TUMOR N/A 08/29/2022   Procedure: TRANSURETHRAL RESECTION OF BLADDER TUMOR (TURBT);  Surgeon: Cleon Gustin, MD;  Location: [facility];  Service: Urology;  Laterality: N/A;   wisdom teeth removal      FAMILY HISTORY:  Family History  Problem Relation Age of Onset   Aneurysm Father    Kidney failure Mother    Hypertension Sister    Thyroid disease Sister    Anxiety disorder Sister    Colitis Sister    Irritable bowel syndrome Sister     SOCIAL HISTORY:  Social History   Socioeconomic History   Marital status: Widowed    Spouse name: Not on file   Number of children: Not on file   Years of education: Not on file   Highest education level: Not on file  Occupational History   Not on file  Tobacco Use   Smoking status: Every Day    Packs/day: 1.00  Years: 40.00    Total pack years: 40.00    Types: Cigarettes   Smokeless tobacco: Never  Vaping Use   Vaping Use: Never used  Substance and Sexual Activity   Alcohol use: Not Currently   Drug use: Not Currently   Sexual activity: Not Currently    Birth control/protection: Post-menopausal  Other Topics Concern   Not on file  Social History Narrative   Not on file   Social Determinants of Health   Financial Resource Strain: Low Risk  (02/21/2022)   Overall Financial Resource Strain (CARDIA)    Difficulty of Paying Living Expenses: Not hard  at all  Food Insecurity: No Food Insecurity (09/12/2022)   Hunger Vital Sign    Worried About Running Out of Food in the Last Year: Never true    Ran Out of Food in the Last Year: Never true  Transportation Needs: No Transportation Needs (09/12/2022)   PRAPARE - Hydrologist (Medical): No    Lack of Transportation (Non-Medical): No  Physical Activity: Inactive (02/21/2022)   Exercise Vital Sign    Days of Exercise per Week: 0 days    Minutes of Exercise per Session: 10 min  Stress: Stress Concern Present (02/21/2022)   [place]    Feeling of Stress : To some extent  Social Connections: Socially Isolated (02/21/2022)   Social Connection and Isolation Panel [NHANES]    Frequency of Communication with Friends and Family: More than three times a week    Frequency of Social Gatherings with Friends and Family: Twice a week    Attends Religious Services: Never    Marine scientist or Organizations: No    Attends Archivist Meetings: Never    Marital Status: Widowed  Intimate Partner Violence: Not At Risk (09/12/2022)   Humiliation, Afraid, Rape, and Kick questionnaire    Fear of Current or Ex-Partner: No    Emotionally Abused: No    Physically Abused: No    Sexually Abused: No    ALLERGIES: Penicillins, Aspirin, Demerol [meperidine hcl], and Sulfonamide derivatives  MEDICATIONS:  Current Outpatient Medications  Medication Sig Dispense Refill   acetaminophen (TYLENOL) 500 MG tablet Take 1,000 mg by mouth every 4 (four) hours as needed for mild pain or moderate pain.     amLODipine (NORVASC) 10 MG tablet Take 10 mg by mouth at bedtime.     diazepam (VALIUM) 10 MG tablet Take 10 mg by mouth in the morning and at bedtime.     losartan (COZAAR) 25 MG tablet Take 25 mg by mouth every evening.     oxyCODONE-acetaminophen (PERCOCET) 5-325 MG tablet Take 1 tablet by mouth every 4 (four)  hours as needed for severe pain. 30 tablet 0   Current Facility-Administered Medications  Medication Dose Route Frequency Provider Last Rate Last Admin   ciprofloxacin (CIPRO) tablet 500 mg  500 mg Oral Once McKenzie, Candee Furbish, MD        REVIEW OF SYSTEMS:  On review of systems, the patient reports that she is doing well overall. She denies any chest pain, shortness of breath, cough, fevers, chills, night sweats, unintended weight changes. She denies any bowel or bladder disturbances, and denies abdominal pain, nausea or vomiting. She denies any new musculoskeletal or joint aches or pains. A complete review of systems is obtained and is otherwise negative.    PHYSICAL EXAM:  Wt Readings from Last 3 Encounters:  09/12/22  88 lb (39.9 kg)  08/29/22 88 lb (39.9 kg)  02/21/22 94 lb (42.6 kg)   Temp Readings from Last 3 Encounters:  08/29/22 97.7 F (36.5 C) (Oral)  02/19/22 98.6 F (37 C) (Oral)  02/15/22 98.6 F (37 C) (Oral)   BP Readings from Last 3 Encounters:  09/04/22 122/79  08/29/22 131/69  07/29/22 (!) 143/82   Pulse Readings from Last 3 Encounters:  09/04/22 79  08/29/22 73  07/29/22 88   Pain Assessment Pain Score: 0-No pain/10  In general this is a well appearing Caucasian woman in no acute distress.  She is alert and oriented x4 and appropriate throughout the examination.  Cardiopulmonary assessment is negative for acute distress and she exhibits normal effort.    KPS = 90  100 - Normal; no complaints; no evidence of disease. 90   - Able to carry on normal activity; minor signs or symptoms of disease. 80   - Normal activity with effort; some signs or symptoms of disease. 60   - Cares for self; unable to carry on normal activity or to do active work. 60   - Requires occasional assistance, but is able to care for most of his personal needs. 50   - Requires considerable assistance and frequent medical care. 56   - Disabled; requires special care and  assistance. 43   - Severely disabled; hospital admission is indicated although death not imminent. 32   - Very sick; hospital admission necessary; active supportive treatment necessary. 10   - Moribund; fatal processes progressing rapidly. 0     - Dead  Karnofsky DA, Abelmann [place], Craver LS and Burchenal [facility] [phone]) The use of the nitrogen mustards in the palliative treatment of carcinoma: with particular reference to bronchogenic carcinoma Cancer 1 [phone]  LABORATORY DATA:  Lab Results  Component Value Date   WBC 10.4 08/27/2022   HGB 14.6 08/27/2022   HCT 45.4 08/27/2022   MCV 92.8 08/27/2022   PLT 259 08/27/2022   Lab Results  Component Value Date   NA 135 08/27/2022   K 3.8 08/27/2022   CL 100 08/27/2022   CO2 24 08/27/2022   Lab Results  Component Value Date   ALT 8 02/19/2022   AST 16 02/19/2022   ALKPHOS 62 02/19/2022   BILITOT 0.4 02/19/2022     RADIOGRAPHY: DG C-Arm 1-60 Min  Result Date: 08/29/2022 CLINICAL DATA:  Fluoro guidance provided EXAM: DG C-ARM 1-60 MIN FINDINGS: Dose: 0.5 mGy Fluoro time 2s IMPRESSION: C-arm fluoro guidance provided. Electronically Signed   By: Sammie Bench M.D.   On: 08/29/2022 09:35      IMPRESSION/PLAN: This visit was conducted via MyChart video to spare the patient unnecessary potential exposure in the healthcare setting during the current COVID-19 pandemic. 1. 67 y.o. woman with muscle invasive bladder cancer. Today, we talked to the patient and her son about the findings and workup thus far. We discussed the natural history of muscle invasive bladder cancer and general treatment, highlighting the role of radiotherapy in the setting of a bladder sparing definitive treatment approach. We discussed the available radiation techniques, and focused on the details and logistics of delivery.  Since she is not interested in surgery, we would recommend a 7-week course of daily external beam radiation to the bladder concurrent with systemic  chemotherapy.  We reviewed the anticipated acute and late sequelae associated with radiation in this setting. The patient was encouraged to ask questions that were answered to her stated satisfaction.  At the conclusion of our conversation, the patient is most interested in a bladder sparing treatment approach but would prefer to have her treatment in [place], [place] if possible, since this is much closer to her home in [place].  We will share our discussion with Dr. Alyson Ingles and make a referral to Dr. Lynnette Caffey in radiation oncology at [facility] to further discuss her treatment options.  We enjoyed meeting her today and look forward to following her progress.  Of course, if she ultimately decided to proceed with radiation in [place], we would be more than happy to continue to participate in her care.  She knows that she is welcome to call at anytime with any questions or concerns related to radiation.  Given current concerns for patient exposure during the COVID-19 pandemic, this encounter was conducted via video-enabled WebEx visit.  The patient was notified in advance and has given verbal consent for this type of encounter. The attendants for this meeting include Tyler Pita MD, Ashlyn Bruning PA-C, patient, Torria Grealish and her son. During the encounter, Tyler Pita MD and Freeman Caldron PA-C, were located at [facility] Radiation Oncology Department.  Patient, Kirstein Erbacher and her son were located at home.  We personally spent 60 minutes in this encounter including chart review, reviewing radiological studies, meeting face-to-face with the patient, entering orders and completing documentation.    Nicholos Johns, PA-C    Tyler Pita, MD  [facility] Direct Dial: [phone]  Fax: [phone] [email or web address]  Skype  LinkedIn

## 2022-09-17 ENCOUNTER — Telehealth: Payer: Self-pay

## 2022-09-17 NOTE — Telephone Encounter (Signed)
Patient called advising she was referred to [facility] for cancer treatment. She wanted to know if she was able to complete all of her treatments at [facility] or if you preferred her to have them done at both locations.

## 2022-09-19 DIAGNOSIS — C679 Malignant neoplasm of bladder, unspecified: Secondary | ICD-10-CM | POA: Diagnosis not present

## 2022-09-23 DIAGNOSIS — I10 Essential (primary) hypertension: Secondary | ICD-10-CM | POA: Diagnosis not present

## 2022-09-23 DIAGNOSIS — C679 Malignant neoplasm of bladder, unspecified: Secondary | ICD-10-CM | POA: Diagnosis not present

## 2022-09-26 DIAGNOSIS — C679 Malignant neoplasm of bladder, unspecified: Secondary | ICD-10-CM | POA: Diagnosis not present

## 2022-09-30 ENCOUNTER — Ambulatory Visit: Payer: Medicare Other | Admitting: Urology

## 2022-09-30 ENCOUNTER — Encounter: Payer: Self-pay | Admitting: Urology

## 2022-09-30 VITALS — BP 153/93 | HR 72

## 2022-09-30 DIAGNOSIS — C679 Malignant neoplasm of bladder, unspecified: Secondary | ICD-10-CM

## 2022-09-30 LAB — URINALYSIS, ROUTINE W REFLEX MICROSCOPIC
Bilirubin, UA: NEGATIVE
Glucose, UA: NEGATIVE
Ketones, UA: NEGATIVE
Nitrite, UA: NEGATIVE
Specific Gravity, UA: 1.02 (ref 1.005–1.030)
Urobilinogen, Ur: 0.2 mg/dL (ref 0.2–1.0)
pH, UA: 7 (ref 5.0–7.5)

## 2022-09-30 LAB — MICROSCOPIC EXAMINATION: RBC, Urine: >30 /hpf — AB (ref 0–2)

## 2022-09-30 NOTE — Patient Instructions (Signed)

## 2022-09-30 NOTE — Progress Notes (Unsigned)
09/30/2022 3:07 PM   Raven Lopez [birth year] [id number]  Referring provider: Redmond School, MD [address] [place],  [place] [address]  Followup muscle invasive bladder cancer   HPI: Raven Lopez is a 68yo here for followup for muscle invasive bladder cancer. She met with Dr. Lynnette Caffey at [facility] and has elected to proceed with bladder sparing chemotherapy and radiation. SHe wishes to have her chemotherapy done at [facility]. She underwent staging CT chest which showed no evidence of metastatic disease. No other complaints today   PMH: Past Medical History:  Diagnosis Date   AC (acromioclavicular) joint bone spurs    Anxiety    Asthma    COPD (chronic obstructive pulmonary disease) (HCC)    DDD (degenerative disc disease), lumbar    Hypertension    Neck fracture ([place])    Panic disorder    Scoliosis     Surgical History: Past Surgical History:  Procedure Laterality Date   BLADDER INSTILLATION N/A 08/29/2022   Procedure: BLADDER INSTILLATION;  Surgeon: Cleon Gustin, MD;  Location: [facility];  Service: Urology;  Laterality: N/A;   CESAREAN SECTION     CYSTOSCOPY W/ RETROGRADES Bilateral 08/29/2022   Procedure: CYSTOSCOPY WITH RETROGRADE PYELOGRAM;  Surgeon: Cleon Gustin, MD;  Location: [facility];  Service: Urology;  Laterality: Bilateral;   TRANSURETHRAL RESECTION OF BLADDER TUMOR N/A 08/29/2022   Procedure: TRANSURETHRAL RESECTION OF BLADDER TUMOR (TURBT);  Surgeon: Cleon Gustin, MD;  Location: [facility];  Service: Urology;  Laterality: N/A;   wisdom teeth removal      Home Medications:  Allergies as of 09/30/2022       Reactions   Penicillins Shortness Of Breath, Swelling   Has patient had a PCN reaction causing immediate rash, facial/tongue/throat swelling, SOB or lightheadedness with hypotension: Yes Has patient had a PCN reaction causing severe rash involving mucus membranes or skin necrosis: No Has patient had a PCN reaction that required  hospitalization: Yes Has patient had a PCN reaction occurring within the last 10 years: No If all of the above answers are "NO", then may proceed with Cephalosporin use.   Aspirin    REACTION: dizzy,weak and upset stomach   Demerol [meperidine Hcl] Other (See Comments)   Altered mental status-anger, hallucinations   Sulfonamide Derivatives Nausea And Vomiting        Medication List        Accurate as of September 30, 2022  3:07 PM. If you have any questions, ask your nurse or doctor.          acetaminophen 500 MG tablet Commonly known as: TYLENOL Take 1,000 mg by mouth every 4 (four) hours as needed for mild pain or moderate pain.   amLODipine 10 MG tablet Commonly known as: NORVASC Take 10 mg by mouth at bedtime.   diazepam 10 MG tablet Commonly known as: VALIUM Take 10 mg by mouth in the morning and at bedtime.   losartan 25 MG tablet Commonly known as: COZAAR Take 25 mg by mouth every evening.   oxyCODONE-acetaminophen 5-325 MG tablet Commonly known as: Percocet Take 1 tablet by mouth every 4 (four) hours as needed for severe pain.        Allergies:  Allergies  Allergen Reactions   Penicillins Shortness Of Breath and Swelling    Has patient had a PCN reaction causing immediate rash, facial/tongue/throat swelling, SOB or lightheadedness with hypotension: Yes Has patient had a PCN reaction causing severe rash involving mucus membranes or skin  necrosis: No Has patient had a PCN reaction that required hospitalization: Yes Has patient had a PCN reaction occurring within the last 10 years: No If all of the above answers are "NO", then may proceed with Cephalosporin use.    Aspirin     REACTION: dizzy,weak and upset stomach   Demerol [Meperidine Hcl] Other (See Comments)    Altered mental status-anger, hallucinations   Sulfonamide Derivatives Nausea And Vomiting    Family History: Family History  Problem Relation Age of Onset   Aneurysm Father    Kidney  failure Mother    Hypertension Sister    Thyroid disease Sister    Anxiety disorder Sister    Colitis Sister    Irritable bowel syndrome Sister     Social History:  reports that she has been smoking cigarettes. She has a 40.00 pack-year smoking history. She has never used smokeless tobacco. She reports that she does not currently use alcohol. She reports that she does not currently use drugs.  ROS: All other review of systems were reviewed and are negative except what is noted above in HPI  Physical Exam: BP (!) 153/93   Pulse 72   Constitutional:  Alert and oriented, No acute distress. HEENT: [place] AT, moist mucus membranes.  Trachea midline, no masses. Cardiovascular: No clubbing, cyanosis, or edema. Respiratory: Normal respiratory effort, no increased work of breathing. GI: Abdomen is soft, nontender, nondistended, no abdominal masses GU: No CVA tenderness.  Lymph: No cervical or inguinal lymphadenopathy. Skin: No rashes, bruises or suspicious lesions. Neurologic: Grossly intact, no focal deficits, moving all 4 extremities. Psychiatric: Normal mood and affect.  Laboratory Data: Lab Results  Component Value Date   WBC 10.4 08/27/2022   HGB 14.6 08/27/2022   HCT 45.4 08/27/2022   MCV 92.8 08/27/2022   PLT 259 08/27/2022    Lab Results  Component Value Date   CREATININE 1.10 (H) 08/27/2022    No results found for: "PSA"  No results found for: "TESTOSTERONE"  No results found for: "HGBA1C"  Urinalysis    Component Value Date/Time   COLORURINE AMBER (A) 02/19/2022 1730   APPEARANCEUR Cloudy (A) 07/29/2022 1357   LABSPEC 1.009 02/19/2022 1730   PHURINE 6.0 02/19/2022 1730   GLUCOSEU Negative 07/29/2022 1357   HGBUR MODERATE (A) 02/19/2022 1730   BILIRUBINUR Negative 07/29/2022 1357   KETONESUR NEGATIVE 02/19/2022 1730   PROTEINUR 3+ (A) 07/29/2022 1357   PROTEINUR 100 (A) 02/19/2022 1730   UROBILINOGEN 0.2 02/15/2022 1325   UROBILINOGEN 0.2 04/05/2010 0558    NITRITE Negative 07/29/2022 1357   NITRITE NEGATIVE 02/19/2022 1730   LEUKOCYTESUR Negative 07/29/2022 1357   LEUKOCYTESUR NEGATIVE 02/19/2022 1730    Lab Results  Component Value Date   LABMICR See below: 07/29/2022   WBCUA 0-5 07/29/2022   LABEPIT 0-10 07/29/2022   BACTERIA None seen 07/29/2022    Pertinent Imaging: CT chest 09/29/2022: images reviewed and discussed with the patient  No results found for this or any previous visit.  No results found for this or any previous visit.  No results found for this or any previous visit.  No results found for this or any previous visit.  No results found for this or any previous visit.  No valid procedures specified. No results found for this or any previous visit.  No results found for this or any previous visit.   Assessment & Plan:    1. Malignant neoplasm of urinary bladder, unspecified site [facility]) -referral to [place]  center -she will se me back in 3-4 months for cystoscopy - Urinalysis, Routine w reflex microscopic - Ambulatory referral to Hematology / Oncology   No follow-ups on file.  Nicolette Bang, MD  [facility] [place]

## 2022-10-08 NOTE — Progress Notes (Signed)
Raven Lopez [address], [place] [address]   Clinic Day:  10/09/2022  Referring physician: Cleon Gustin, MD  Patient Care Team: Redmond School, MD as PCP - General (Internal Medicine) Alyson Ingles Candee Furbish, MD as Consulting Physician (Urology) Renaldo Harrison, MD as Consulting Physician (Radiation Oncology) Derek Jack, MD as Consulting Physician (Hematology)   ASSESSMENT & PLAN:   Assessment:  1.  Stage II (T2 N0) high-grade urothelial carcinoma of the bladder: - Presented with intermittent gross hematuria ongoing since 03/2022.  CT [facility] on 06/19/2022: 4.4 cm in the roof of the bladder. - TURBT by Dr. Alyson Ingles (08/29/2022): Infiltrating high-grade urothelial carcinoma, invading muscularis propria (detrusor muscle). - CT CAP (09/26/2022): Interval resection of the mass of the superior right aspect of the urinary bladder with residual posterior bladder dome and right eccentric hyperenhancing wall thickening measuring up to 1.8 cm.  No adenopathy or metastatic disease. - Patient does not want to have radical cystectomy.  She met with Dr. Lynnette Caffey at [facility] and want to proceed with trimodality therapy.  2.  Social/family history: - Seen with her son Raven Lopez today.  She lives by herself and is independent of ADLs and IADLs.  She worked as a Building control surveyor prior to retirement.  Current active smoker, 1 pack/day for the last 50 years, started at age 13. - Paternal grandfather had throat cancer.  Paternal grandmother had ovarian cancer.  Plan:  1.  Stage II (T2 N0) high-grade urothelial carcinoma of the bladder: - I have reviewed her diagnosis, pathology reports and scan results. - She does not want to have a cystectomy.  She is interested in trimodality therapy.  She already met with Dr. Lynnette Caffey in [place]. - She has mild CKD and decreased hearing.  She does not have any neuropathy.  She is not a candidate for cisplatin. - We discussed chemotherapy  with 5-FU and mitomycin concurrently with radiation. - We will request port placement.  We discussed side effects in detail. - We will see her back when she is ready to start treatment.   Orders Placed This Encounter  Procedures   IR IMAGING GUIDED PORT INSERTION    Standing Status:   Future    Standing Expiration Date:   10/09/2023    Order Specific Question:   Reason for Exam (SYMPTOM  OR DIAGNOSIS REQUIRED)    Answer:   chemotherapy administration    Order Specific Question:   Preferred Imaging Location?    Answer:   [facility]    Order Specific Question:   Release to patient    Answer:   Immediate   Magnesium    Standing Status:   Future    Standing Expiration Date:   10/30/2023   CBC with Differential    Standing Status:   Future    Standing Expiration Date:   10/30/2023   Comprehensive metabolic panel    Standing Status:   Future    Standing Expiration Date:   10/30/2023   Magnesium    Standing Status:   Future    Standing Expiration Date:   11/19/2023   CBC with Differential    Standing Status:   Future    Standing Expiration Date:   11/19/2023   Comprehensive metabolic panel    Standing Status:   Future    Standing Expiration Date:   11/19/2023   Ambulatory referral to Social Work    Referral Priority:   Routine    Referral Type:  Consultation    Referral Reason:   Specialty Services Required    Number of Visits Requested:   1   Ambulatory Referral to [facility] Nutrition    Referral Priority:   Routine    Referral Type:   Consultation    Referral Reason:   Specialty Services Required    Requested Specialty:   Oncology    Number of Visits Requested:   1      I,Katie Daubenspeck,acting as a scribe for Derek Jack, MD.,have documented all relevant documentation on the behalf of Derek Jack, MD,as directed by  Derek Jack, MD while in the presence of Derek Jack, MD.   I, Derek Jack MD, have reviewed the above documentation  for accuracy and completeness, and I agree with the above.   Derek Jack, MD   4/3/20244:35 PM  CHIEF COMPLAINT/PURPOSE OF CONSULT:   Diagnosis: muscle invasive bladder cancer    Cancer Staging  Malignant neoplasm of urinary bladder Staging form: Urinary Bladder, AJCC 8th Edition - Clinical stage from 10/09/2022: Stage II (cT2, cN0, cM0) - Unsigned    Prior Therapy: TURBT with gemcitabine instillation 08/29/22  Current Therapy: Concurrent chemoradiation therapy   HISTORY OF PRESENT ILLNESS:   Oncology History  Malignant neoplasm of urinary bladder  07/29/2022 Initial Diagnosis   Malignant neoplasm of urinary bladder   10/30/2022 -  Chemotherapy   Patient is on Treatment Plan : BLADDER Mitomycin D1 + 5FU D1-5, 21-25 + XRT         Raven Lopez is a 67 y.o. female presenting to clinic today for evaluation of muscle invasive bladder cancer at the request of Dr. Alyson Ingles.  She presented to her PCP with a 3 month history of intermittent gross hematuria. CT A/P on 06/19/22 showed: 4.4 cm tumor in roof of bladder, with a portion appearing to extend intraluminally; no associated lymphadenopathy. She was referred to Dr. Alyson Ingles on 07/29/22. In-office cystoscopy was performed that day and confirmed a 6 cm sessile mass on posterior wall and trigone of bladder. She proceeded to TURBT on 08/29/22 with gemcitabine instillation, and pathology confirmed high-grade infiltrative urothelial carcinoma invading muscularis propria.  She met with Dr. Tammi Klippel on 09/12/22 to discuss radiation therapy and requested referral to Dr. Lynnette Caffey in [place], as this is closer to where she lives. He recommended restaging scan with CT. She underwent CT C/A/P on 09/26/22 in [place] showing: residual posterior bladder done and right eccentric hyperenhancing wall thickening measuring up to 1.8 cm; no evidence of lymphadenopathy or metastatic disease.  Today, she states that she is doing well overall. Her appetite level is at 50%.  Her energy level is at 50%.  PAST MEDICAL HISTORY:   Past Medical History: Past Medical History:  Diagnosis Date   AC (acromioclavicular) joint bone spurs    Anxiety    Asthma    COPD (chronic obstructive pulmonary disease)    DDD (degenerative disc disease), lumbar    Hypertension    Neck fracture    Panic disorder    Scoliosis     Surgical History: Past Surgical History:  Procedure Laterality Date   BLADDER INSTILLATION N/A 08/29/2022   Procedure: BLADDER INSTILLATION;  Surgeon: Cleon Gustin, MD;  Location: [facility];  Service: Urology;  Laterality: N/A;   CESAREAN SECTION     CYSTOSCOPY W/ RETROGRADES Bilateral 08/29/2022   Procedure: CYSTOSCOPY WITH RETROGRADE PYELOGRAM;  Surgeon: Cleon Gustin, MD;  Location: [facility];  Service: Urology;  Laterality: Bilateral;   TRANSURETHRAL RESECTION OF BLADDER TUMOR  N/A 08/29/2022   Procedure: TRANSURETHRAL RESECTION OF BLADDER TUMOR (TURBT);  Surgeon: Cleon Gustin, MD;  Location: [facility];  Service: Urology;  Laterality: N/A;   wisdom teeth removal      Social History: Social History   Socioeconomic History   Marital status: Widowed    Spouse name: Not on file   Number of children: Not on file   Years of education: Not on file   Highest education level: Not on file  Occupational History   Not on file  Tobacco Use   Smoking status: Every Day    Packs/day: 1.00    Years: 40.00    Additional pack years: 0.00    Total pack years: 40.00    Types: Cigarettes   Smokeless tobacco: Never  Vaping Use   Vaping Use: Never used  Substance and Sexual Activity   Alcohol use: Not Currently   Drug use: Not Currently   Sexual activity: Not Currently    Birth control/protection: Post-menopausal  Other Topics Concern   Not on file  Social History Narrative   Not on file   Social Determinants of Health   Financial Resource Strain: Low Risk  (02/21/2022)   Overall Financial Resource Strain (CARDIA)    Difficulty of  Paying Living Expenses: Not hard at all  Food Insecurity: No Food Insecurity (09/12/2022)   Hunger Vital Sign    Worried About Running Out of Food in the Last Year: Never true    Ran Out of Food in the Last Year: Never true  Transportation Needs: No Transportation Needs (09/12/2022)   PRAPARE - Hydrologist (Medical): No    Lack of Transportation (Non-Medical): No  Physical Activity: Inactive (02/21/2022)   Exercise Vital Sign    Days of Exercise per Week: 0 days    Minutes of Exercise per Session: 10 min  Stress: Stress Concern Present (02/21/2022)   [place]    Feeling of Stress : To some extent  Social Connections: Socially Isolated (02/21/2022)   Social Connection and Isolation Panel [NHANES]    Frequency of Communication with Friends and Family: More than three times a week    Frequency of Social Gatherings with Friends and Family: Twice a week    Attends Religious Services: Never    Marine scientist or Organizations: No    Attends Archivist Meetings: Never    Marital Status: Widowed  Intimate Partner Violence: Not At Risk (09/12/2022)   Humiliation, Afraid, Rape, and Kick questionnaire    Fear of Current or Ex-Partner: No    Emotionally Abused: No    Physically Abused: No    Sexually Abused: No    Family History: Family History  Problem Relation Age of Onset   Aneurysm Father    Kidney failure Mother    Hypertension Sister    Thyroid disease Sister    Anxiety disorder Sister    Colitis Sister    Irritable bowel syndrome Sister     Current Medications:  Current Outpatient Medications:    acetaminophen (TYLENOL) 500 MG tablet, Take 1,000 mg by mouth every 4 (four) hours as needed for mild pain or moderate pain., Disp: , Rfl:    amLODipine (NORVASC) 10 MG tablet, Take 10 mg by mouth at bedtime., Disp: , Rfl:    diazepam (VALIUM) 10 MG tablet, Take 10 mg by mouth  in the morning and at bedtime., Disp: , Rfl:  losartan (COZAAR) 25 MG tablet, Take 25 mg by mouth every evening., Disp: , Rfl:   Current Facility-Administered Medications:    ciprofloxacin (CIPRO) tablet 500 mg, 500 mg, Oral, Once, McKenzie, Candee Furbish, MD   Allergies: Allergies  Allergen Reactions   Penicillins Shortness Of Breath and Swelling    Has patient had a PCN reaction causing immediate rash, facial/tongue/throat swelling, SOB or lightheadedness with hypotension: Yes Has patient had a PCN reaction causing severe rash involving mucus membranes or skin necrosis: No Has patient had a PCN reaction that required hospitalization: Yes Has patient had a PCN reaction occurring within the last 10 years: No If all of the above answers are "NO", then may proceed with Cephalosporin use.    Aspirin     REACTION: dizzy,weak and upset stomach   Demerol [Meperidine Hcl] Other (See Comments)    Altered mental status-anger, hallucinations   Sulfonamide Derivatives Nausea And Vomiting    REVIEW OF SYSTEMS:   Review of Systems  Constitutional:  Negative for chills, fatigue and fever.  HENT:   Negative for lump/mass, mouth sores, nosebleeds, sore throat and trouble swallowing.   Eyes:  Negative for eye problems.  Respiratory:  Negative for cough and shortness of breath.   Cardiovascular:  Positive for palpitations. Negative for chest pain and leg swelling.  Gastrointestinal:  Negative for abdominal pain, constipation, diarrhea, nausea and vomiting.  Genitourinary:  Negative for bladder incontinence, difficulty urinating, dysuria, frequency, hematuria and nocturia.   Musculoskeletal:  Negative for arthralgias, back pain, flank pain, myalgias and neck pain.  Skin:  Negative for itching and rash.  Neurological:  Negative for dizziness, headaches and numbness.  Hematological:  Does not bruise/bleed easily.  Psychiatric/Behavioral:  Negative for depression, sleep disturbance and suicidal ideas.  The patient is nervous/anxious.   All other systems reviewed and are negative.    VITALS:   Blood pressure 127/83, pulse 84, temperature 98.8 F (37.1 C), temperature source Oral, resp. rate 16, height 5' (1.524 m), weight 84 lb 6.4 oz (38.3 kg), SpO2 98 %.  Wt Readings from Last 3 Encounters:  10/09/22 84 lb 6.4 oz (38.3 kg)  09/12/22 88 lb (39.9 kg)  08/29/22 88 lb (39.9 kg)    Body mass index is 16.48 kg/m.  Performance status (ECOG): 1 - Symptomatic but completely ambulatory  PHYSICAL EXAM:   Physical Exam Vitals and nursing note reviewed. Exam conducted with a chaperone present.  Constitutional:      Appearance: Normal appearance.  Cardiovascular:     Rate and Rhythm: Normal rate and regular rhythm.     Pulses: Normal pulses.     Heart sounds: Normal heart sounds.  Pulmonary:     Effort: Pulmonary effort is normal.     Breath sounds: Normal breath sounds.  Abdominal:     Palpations: Abdomen is soft. There is no hepatomegaly, splenomegaly or mass.     Tenderness: There is no abdominal tenderness.  Musculoskeletal:     Right lower leg: No edema.     Left lower leg: No edema.  Lymphadenopathy:     Cervical: No cervical adenopathy.     Right cervical: No superficial, deep or posterior cervical adenopathy.    Left cervical: No superficial, deep or posterior cervical adenopathy.     Upper Body:     Right upper body: No supraclavicular or axillary adenopathy.     Left upper body: No supraclavicular or axillary adenopathy.  Neurological:     General: No focal deficit present.  Mental Status: She is alert and oriented to person, place, and time.  Psychiatric:        Mood and Affect: Mood normal.        Behavior: Behavior normal.     LABS:      Latest Ref Rng & Units 08/27/2022    4:11 PM 02/19/2022    2:16 PM 04/05/2010    6:18 AM  CBC  WBC 4.0 - 10.5 K/uL 10.4  7.9    Hemoglobin 12.0 - 15.0 g/dL 14.6  14.3  15.3   Hematocrit 36.0 - 46.0 % 45.4  43.5  45.0    Platelets 150 - 400 K/uL 259  241        Latest Ref Rng & Units 08/27/2022    4:11 PM 06/19/2022    9:30 AM 02/19/2022    2:16 PM  CMP  Glucose 70 - 99 mg/dL 110   99   BUN 8 - 23 mg/dL 14   21   Creatinine 0.44 - 1.00 mg/dL 1.10  1.00  1.01   Sodium 135 - 145 mmol/L 135   139   Potassium 3.5 - 5.1 mmol/L 3.8   4.1   Chloride 98 - 111 mmol/L 100   106   CO2 22 - 32 mmol/L 24   25   Calcium 8.9 - 10.3 mg/dL 9.1   9.3   Total Protein 6.5 - 8.1 g/dL   7.3   Total Bilirubin 0.3 - 1.2 mg/dL   0.4   Alkaline Phos 38 - 126 U/L   62   AST 15 - 41 U/L   16   ALT 0 - 44 U/L   8      No results found for: "CEA1", "CEA" / No results found for: "CEA1", "CEA" No results found for: "PSA1" No results found for: "[id number]" No results found for: "CAN125"  No results found for: "TOTALPROTELP", "ALBUMINELP", "A1GS", "A2GS", "BETS", "BETA2SER", "GAMS", "MSPIKE", "SPEI" No results found for: "TIBC", "FERRITIN", "IRONPCTSAT" No results found for: "LDH"   STUDIES:   No results found.

## 2022-10-09 ENCOUNTER — Encounter: Payer: Self-pay | Admitting: Hematology

## 2022-10-09 ENCOUNTER — Inpatient Hospital Stay: Payer: Medicare Other | Attending: Hematology | Admitting: Hematology

## 2022-10-09 VITALS — BP 127/83 | HR 84 | Temp 98.8°F | Resp 16 | Ht 60.0 in | Wt 84.4 lb

## 2022-10-09 DIAGNOSIS — Z79631 Long term (current) use of antimetabolite agent: Secondary | ICD-10-CM | POA: Diagnosis not present

## 2022-10-09 DIAGNOSIS — N182 Chronic kidney disease, stage 2 (mild): Secondary | ICD-10-CM | POA: Diagnosis not present

## 2022-10-09 DIAGNOSIS — F17210 Nicotine dependence, cigarettes, uncomplicated: Secondary | ICD-10-CM | POA: Diagnosis not present

## 2022-10-09 DIAGNOSIS — C679 Malignant neoplasm of bladder, unspecified: Secondary | ICD-10-CM | POA: Diagnosis not present

## 2022-10-09 DIAGNOSIS — R310 Gross hematuria: Secondary | ICD-10-CM | POA: Diagnosis not present

## 2022-10-09 DIAGNOSIS — Z8249 Family history of ischemic heart disease and other diseases of the circulatory system: Secondary | ICD-10-CM | POA: Diagnosis not present

## 2022-10-09 DIAGNOSIS — Z8349 Family history of other endocrine, nutritional and metabolic diseases: Secondary | ICD-10-CM

## 2022-10-09 DIAGNOSIS — R630 Anorexia: Secondary | ICD-10-CM | POA: Insufficient documentation

## 2022-10-09 DIAGNOSIS — Z885 Allergy status to narcotic agent status: Secondary | ICD-10-CM | POA: Diagnosis not present

## 2022-10-09 DIAGNOSIS — R232 Flushing: Secondary | ICD-10-CM | POA: Insufficient documentation

## 2022-10-09 DIAGNOSIS — Z882 Allergy status to sulfonamides status: Secondary | ICD-10-CM | POA: Diagnosis not present

## 2022-10-09 DIAGNOSIS — Z79632 Long term (current) use of antitumor antibiotic: Secondary | ICD-10-CM | POA: Insufficient documentation

## 2022-10-09 DIAGNOSIS — Z79899 Other long term (current) drug therapy: Secondary | ICD-10-CM | POA: Diagnosis not present

## 2022-10-09 DIAGNOSIS — Z880 Allergy status to penicillin: Secondary | ICD-10-CM

## 2022-10-09 DIAGNOSIS — I129 Hypertensive chronic kidney disease with stage 1 through stage 4 chronic kidney disease, or unspecified chronic kidney disease: Secondary | ICD-10-CM | POA: Diagnosis not present

## 2022-10-09 DIAGNOSIS — Z8041 Family history of malignant neoplasm of ovary: Secondary | ICD-10-CM | POA: Diagnosis not present

## 2022-10-09 DIAGNOSIS — Z5111 Encounter for antineoplastic chemotherapy: Secondary | ICD-10-CM | POA: Diagnosis not present

## 2022-10-09 DIAGNOSIS — Z818 Family history of other mental and behavioral disorders: Secondary | ICD-10-CM | POA: Diagnosis not present

## 2022-10-09 DIAGNOSIS — R319 Hematuria, unspecified: Secondary | ICD-10-CM

## 2022-10-09 DIAGNOSIS — Z841 Family history of disorders of kidney and ureter: Secondary | ICD-10-CM

## 2022-10-09 DIAGNOSIS — Z886 Allergy status to analgesic agent status: Secondary | ICD-10-CM

## 2022-10-09 DIAGNOSIS — Z8379 Family history of other diseases of the digestive system: Secondary | ICD-10-CM

## 2022-10-09 NOTE — Progress Notes (Signed)
START ON PATHWAY REGIMEN - Bladder     One cycle, concurrent with RT:     Fluorouracil      Mitomycin   **Always confirm dose/schedule in your pharmacy ordering system**  Patient Characteristics: Pre-Cystectomy or Nonsurgical Candidate, M0 (Clinical Staging), cT2-4a, cN0-1, M0, Bladder-Sparing Approach Therapeutic Status: Pre-Cystectomy or Nonsurgical Candidate, M0 (Clinical Staging) AJCC M Category: cM0 AJCC 8 Stage Grouping: II AJCC T Category: cT2 AJCC N Category: cN0 Intent of Therapy: Curative Intent, Discussed with Patient 

## 2022-10-09 NOTE — Patient Instructions (Addendum)
[facility]  Discharge Instructions  You were seen and examined today by Dr. Delton Coombes. Dr. Delton Coombes is a medical oncologist, meaning that he specializes in the treatment of cancer diagnoses. Dr. Delton Coombes discussed your past medical history, family history of cancers, and the events that led to you being here today.  You were referred to Dr. Delton Coombes due to a new diagnosis of Stage II bladder cancer.  The cancer is contained to the bladder and can be cured with appropriate treatment.  Dr. Delton Coombes has recommended starting chemotherapy with radiation therapy concurrently.   Prior to the start of chemotherapy, you will need a Port-A-Cath placed.  Follow-up as scheduled.  Thank you for choosing [facility] to provide your oncology and hematology care.   To afford each patient quality time with our provider, please arrive at least 15 minutes before your scheduled appointment time. You may need to reschedule your appointment if you arrive late (10 or more minutes). Arriving late affects you and other patients whose appointments are after yours.  Also, if you miss three or more appointments without notifying the office, you may be dismissed from the clinic at the provider's discretion.    Again, thank you for choosing [facility].  Our hope is that these requests will decrease the amount of time that you wait before being seen by our physicians.   If you have a lab appointment with the [facility] - please note that after April 8th, all labs will be drawn in the cancer center.  You do not have to check in or register with the main entrance as you have in the past but will complete your check-in at the cancer center.            _____________________________________________________________  Should you have questions after your visit to [facility], please contact our office at [phone] and follow the  prompts.  Our office hours are 8:00 a.m. to 4:30 p.m. Monday - Thursday and 8:00 a.m. to 2:30 p.m. Friday.  Please note that voicemails left after 4:00 p.m. may not be returned until the following business day.  We are closed weekends and all major holidays.  You do have access to a nurse 24-7, just call the main number to the clinic [phone] and do not press any options, hold on the line and a nurse will answer the phone.    For prescription refill requests, have your pharmacy contact our office and allow 72 hours.    Masks are no longer required in the cancer centers. If you would like for your care team to wear a mask while they are taking care of you, please let them know. You may have one support person who is at least 69 years old accompany you for your appointments.

## 2022-10-10 ENCOUNTER — Other Ambulatory Visit: Payer: Self-pay

## 2022-10-14 ENCOUNTER — Inpatient Hospital Stay: Payer: Medicare Other | Admitting: Dietician

## 2022-10-14 DIAGNOSIS — C674 Malignant neoplasm of posterior wall of bladder: Secondary | ICD-10-CM | POA: Diagnosis not present

## 2022-10-14 DIAGNOSIS — C679 Malignant neoplasm of bladder, unspecified: Secondary | ICD-10-CM | POA: Diagnosis not present

## 2022-10-16 ENCOUNTER — Other Ambulatory Visit: Payer: Self-pay | Admitting: Radiology

## 2022-10-17 ENCOUNTER — Ambulatory Visit (HOSPITAL_COMMUNITY)
Admission: RE | Admit: 2022-10-17 | Discharge: 2022-10-17 | Disposition: A | Discharge status: Home / Self Care | Payer: Medicare Other | Source: Ambulatory Visit | Attending: Hematology | Admitting: Hematology

## 2022-10-17 ENCOUNTER — Other Ambulatory Visit: Payer: Self-pay

## 2022-10-17 ENCOUNTER — Encounter (HOSPITAL_COMMUNITY): Payer: Self-pay

## 2022-10-17 ENCOUNTER — Other Ambulatory Visit: Payer: Medicare Other | Admitting: Licensed Clinical Social Worker

## 2022-10-17 DIAGNOSIS — I129 Hypertensive chronic kidney disease with stage 1 through stage 4 chronic kidney disease, or unspecified chronic kidney disease: Secondary | ICD-10-CM | POA: Diagnosis not present

## 2022-10-17 DIAGNOSIS — C679 Malignant neoplasm of bladder, unspecified: Secondary | ICD-10-CM | POA: Diagnosis not present

## 2022-10-17 DIAGNOSIS — F419 Anxiety disorder, unspecified: Secondary | ICD-10-CM | POA: Diagnosis not present

## 2022-10-17 DIAGNOSIS — F17210 Nicotine dependence, cigarettes, uncomplicated: Secondary | ICD-10-CM | POA: Insufficient documentation

## 2022-10-17 DIAGNOSIS — Z452 Encounter for adjustment and management of vascular access device: Secondary | ICD-10-CM | POA: Diagnosis not present

## 2022-10-17 DIAGNOSIS — J449 Chronic obstructive pulmonary disease, unspecified: Secondary | ICD-10-CM | POA: Insufficient documentation

## 2022-10-17 DIAGNOSIS — M419 Scoliosis, unspecified: Secondary | ICD-10-CM | POA: Diagnosis not present

## 2022-10-17 DIAGNOSIS — N189 Chronic kidney disease, unspecified: Secondary | ICD-10-CM | POA: Insufficient documentation

## 2022-10-17 DIAGNOSIS — C674 Malignant neoplasm of posterior wall of bladder: Secondary | ICD-10-CM | POA: Diagnosis not present

## 2022-10-17 HISTORY — PX: IR IMAGING GUIDED PORT INSERTION: IMG5740

## 2022-10-17 MED ORDER — MIDAZOLAM HCL 2 MG/2ML IJ SOLN
INTRAMUSCULAR | Status: AC
  Filled 2022-10-17: qty 2

## 2022-10-17 MED ORDER — HEPARIN SOD (PORK) LOCK FLUSH 100 UNIT/ML IV SOLN
500.0000 [IU] | Freq: Once | INTRAVENOUS | Status: AC
  Administered 2022-10-17: 500 [IU] via INTRAVENOUS

## 2022-10-17 MED ORDER — MIDAZOLAM HCL 2 MG/2ML IJ SOLN
INTRAMUSCULAR | Status: AC | PRN
  Administered 2022-10-17 (×2): 1 mg via INTRAVENOUS

## 2022-10-17 MED ORDER — HEPARIN SOD (PORK) LOCK FLUSH 100 UNIT/ML IV SOLN
INTRAVENOUS | Status: AC
  Filled 2022-10-17: qty 5

## 2022-10-17 MED ORDER — LIDOCAINE-EPINEPHRINE 1 %-1:100000 IJ SOLN
INTRAMUSCULAR | Status: AC
  Filled 2022-10-17: qty 1

## 2022-10-17 MED ORDER — LIDOCAINE-EPINEPHRINE 1 %-1:100000 IJ SOLN
20.0000 mL | Freq: Once | INTRAMUSCULAR | Status: AC
  Administered 2022-10-17: 7 mL via INTRADERMAL

## 2022-10-17 MED ORDER — SODIUM CHLORIDE 0.9 % IV SOLN: INTRAVENOUS | Status: DC

## 2022-10-17 MED ORDER — FENTANYL CITRATE (PF) 100 MCG/2ML IJ SOLN
INTRAMUSCULAR | Status: AC
  Filled 2022-10-17: qty 2

## 2022-10-17 MED ORDER — FENTANYL CITRATE (PF) 100 MCG/2ML IJ SOLN
INTRAMUSCULAR | Status: AC | PRN
  Administered 2022-10-17 (×2): 50 ug via INTRAVENOUS

## 2022-10-17 NOTE — Consult Note (Signed)
Chief Complaint: Patient was seen in consultation today for port a cath placement  Referring Physician(s): Katragadda,Sreedhar  Supervising Physician: Simonne Come  Patient Status: [facility]  History of Present Illness: Raven Lopez is a 67 y.o. female smoker with PMH anxiety, asthma, COPD, DDD, HTN, mild CKD, decreased hearing ,scoliosis who presents now with newly diagnosed muscle invasive bladder carcinoma, s/p TURBT on 08/29/22. She is scheduled today for port a cath placement to assist with treatment.   Past Medical History:  Diagnosis Date   AC (acromioclavicular) joint bone spurs    Anxiety    Asthma    COPD (chronic obstructive pulmonary disease)    DDD (degenerative disc disease), lumbar    Hypertension    Neck fracture    Panic disorder    Scoliosis     Past Surgical History:  Procedure Laterality Date   BLADDER INSTILLATION N/A 08/29/2022   Procedure: BLADDER INSTILLATION;  Surgeon: Malen Gauze, MD;  Location: [facility];  Service: Urology;  Laterality: N/A;   CESAREAN SECTION     CYSTOSCOPY W/ RETROGRADES Bilateral 08/29/2022   Procedure: CYSTOSCOPY WITH RETROGRADE PYELOGRAM;  Surgeon: Malen Gauze, MD;  Location: [facility];  Service: Urology;  Laterality: Bilateral;   TRANSURETHRAL RESECTION OF BLADDER TUMOR N/A 08/29/2022   Procedure: TRANSURETHRAL RESECTION OF BLADDER TUMOR (TURBT);  Surgeon: Malen Gauze, MD;  Location: [facility];  Service: Urology;  Laterality: N/A;   wisdom teeth removal      Allergies: Penicillins, Aspirin, Demerol [meperidine hcl], and Sulfonamide derivatives  Medications: Prior to Admission medications   Medication Sig Start Date End Date Taking? Authorizing Provider  acetaminophen (TYLENOL) 500 MG tablet Take 1,000 mg by mouth every 4 (four) hours as needed for mild pain or moderate pain.   Yes [provider]  amLODipine (NORVASC) 10 MG tablet Take 10 mg by mouth at bedtime.   Yes [provider]   diazepam (VALIUM) 10 MG tablet Take 10 mg by mouth in the morning and at bedtime.   Yes [provider]  losartan (COZAAR) 25 MG tablet Take 25 mg by mouth every evening. 12/14/21  Yes [provider]     Family History  Problem Relation Age of Onset   Aneurysm Father    Kidney failure Mother    Hypertension Sister    Thyroid disease Sister    Anxiety disorder Sister    Colitis Sister    Irritable bowel syndrome Sister     Social History   Socioeconomic History   Marital status: Widowed    Spouse name: Not on file   Number of children: Not on file   Years of education: Not on file   Highest education level: Not on file  Occupational History   Not on file  Tobacco Use   Smoking status: Every Day    Packs/day: 1.00    Years: 40.00    Additional pack years: 0.00    Total pack years: 40.00    Types: Cigarettes   Smokeless tobacco: Never  Vaping Use   Vaping Use: Never used  Substance and Sexual Activity   Alcohol use: Not Currently   Drug use: Not Currently   Sexual activity: Not Currently    Birth control/protection: Post-menopausal  Other Topics Concern   Not on file  Social History Narrative   Not on file   Social Determinants of Health   Financial Resource Strain: Low Risk  (02/21/2022)   Overall Financial Resource Strain (  CARDIA)    Difficulty of Paying Living Expenses: Not hard at all  Food Insecurity: No Food Insecurity (09/12/2022)   Hunger Vital Sign    Worried About Running Out of Food in the Last Year: Never true    Ran Out of Food in the Last Year: Never true  Transportation Needs: No Transportation Needs (09/12/2022)   PRAPARE - Administrator, Civil ServiceTransportation    Lack of Transportation (Medical): No    Lack of Transportation (Non-Medical): No  Physical Activity: Inactive (02/21/2022)   Exercise Vital Sign    Days of Exercise per Week: 0 days    Minutes of Exercise per Session: 10 min  Stress: Stress Concern Present (02/21/2022)   [employer] - Occupational Stress Questionnaire    Feeling of Stress : To some extent  Social Connections: Socially Isolated (02/21/2022)   Social Connection and Isolation Panel [NHANES]    Frequency of Communication with Friends and Family: More than three times a week    Frequency of Social Gatherings with Friends and Family: Twice a week    Attends Religious Services: Never    Database administratorActive Member of Clubs or Organizations: No    Attends BankerClub or Organization Meetings: Never    Marital Status: Widowed    Review of Systems denies fever,HA,CP,abd pain,N/V or bleeding; does have some dyspnea with exertion, occ cough, back pain, weight loss  Vital Signs: Ht 5' (1.524 m)   Wt 84 lb 7 oz (38.3 kg)   BMI 16.49 kg/m   Code Status: FULL CODE   Physical Exam: cachetic appearing WF; chest- distant BS bilat; heart-RRR,+murmur; abd- soft,+BS,NT; no LE edema  Imaging: No results found.  Labs:  CBC: Recent Labs    02/19/22 1416 08/27/22 1611  WBC 7.9 10.4  HGB 14.3 14.6  HCT 43.5 45.4  PLT 241 259    COAGS: No results for input(s): "INR", "APTT" in the last 8760 hours.  BMP: Recent Labs    02/19/22 1416 06/19/22 0930 08/27/22 1611  NA 139  --  135  K 4.1  --  3.8  CL 106  --  100  CO2 25  --  24  GLUCOSE 99  --  110*  BUN 21  --  14  CALCIUM 9.3  --  9.1  CREATININE 1.01* 1.00 1.10*  GFRNONAA >60  --  55*    LIVER FUNCTION TESTS: Recent Labs    02/19/22 1416  BILITOT 0.4  AST 16  ALT 8  ALKPHOS 62  PROT 7.3  ALBUMIN 3.9    TUMOR MARKERS: No results for input(s): "AFPTM", "CEA", "CA199", "CHROMGRNA" in the last 8760 hours.  Assessment and Plan: 67 y.o. female smoker with PMH anxiety, asthma, COPD, DDD, HTN, mild CKD, decreased hearing ,scoliosis who presents now with newly diagnosed muscle invasive bladder carcinoma, s/p TURBT on 08/29/22. She is scheduled today for port a cath placement to assist with treatment. Risks and benefits of image guided  port-a-catheter placement was discussed with the patient including, but not limited to bleeding, infection, pneumothorax, or fibrin sheath development and need for additional procedures.  All of the patient's questions were answered, patient is agreeable to proceed. Consent signed and in chart.    Thank you for this interesting consult.  I greatly enjoyed meeting Raven Lopez and look forward to participating in their care.  A copy of this report was sent to the requesting provider on this date.  Electronically Signed: D. Jeananne RamaKevin Kylah Maresh, PA-C 10/17/2022, 1:10 PM  I spent a total of 25 minutes  in face to face in clinical consultation, greater than 50% of which was counseling/coordinating care for port a cath placement

## 2022-10-17 NOTE — Procedures (Signed)
Pre Procedure Dx: Poor venous access Post Procedural Dx: Same  Successful placement of right IJ approach port-a-cath with tip at the superior caval atrial junction. The catheter is ready for immediate use.  Estimated Blood Loss: Trace  Complications: None immediate.  Jay Wilbon Obenchain, MD Pager #: [phone]   

## 2022-10-17 NOTE — Discharge Instructions (Addendum)
Discharge Instructions:   Please call Interventional Radiology clinic [phone] with any questions or concerns.  You may remove your dressing and shower tomorrow.  Do not use EMLA / Lidocaine cream for 2 weeks post Port Insertion.  Moderate Conscious Sedation, Adult, Care After This sheet gives you information about how to care for yourself after your procedure. Your health care provider may also give you more specific instructions. If you have problems or questions, contact your health care provider. What can I expect after the procedure? After the procedure, it is common to have: Sleepiness for several hours. Impaired judgment for several hours. Difficulty with balance. Vomiting if you eat too soon. Follow these instructions at home: For the time period you were told by your health care provider:Implanted Port Insertion, Care After The following information offers guidance on how to care for yourself after your procedure. Your health care provider may also give you more specific instructions. If you have problems or questions, contact your health care provider. What can I expect after the procedure? After the procedure, it is common to have: Discomfort at the port insertion site. Bruising on the skin over the port. This should improve over 3-4 days. Follow these instructions at home: [facility] After your port is placed, you will get a manufacturer's information card. The card has information about your port. Keep this card with you at all times. Take care of the port as told by your health care provider. Ask your health care provider if you or a family member can get training for taking care of the port at home. A home health care nurse will be be available to help care for the port. Make sure to remember what type of port you have. Incision care     Follow instructions from your health care provider about how to take care of your port insertion site. Make sure you: Wash your  hands with soap and water for at least 20 seconds before and after you change your bandage (dressing). If soap and water are not available, use hand sanitizer. Change your dressing as told by your health care provider. Leave stitches (sutures), skin glue, or adhesive strips in place. These skin closures may need to stay in place for 2 weeks or longer. If adhesive strip edges start to loosen and curl up, you may trim the loose edges. Do not remove adhesive strips completely unless your health care provider tells you to do that. Check your port insertion site every day for signs of infection. Check for: Redness, swelling, or pain. Fluid or blood. Warmth. Pus or a bad smell. Activity Return to your normal activities as told by your health care provider. Ask your health care provider what activities are safe for you. You may have to avoid lifting. Ask your health care provider how much you can safely lift. General instructions Take over-the-counter and prescription medicines only as told by your health care provider. Do not take baths, swim, or use a hot tub until your health care provider approves. Ask your health care provider if you may take showers. You may only be allowed to take sponge baths. If you were given a sedative during the procedure, it can affect you for several hours. Do not drive or operate machinery until your health care provider says that it is safe. Wear a medical alert bracelet in case of an emergency. This will tell any health care providers that you have a port. Keep all follow-up visits. This is important. Contact  a health care provider if: You cannot flush your port with saline as directed, or you cannot draw blood from the port. You have a fever or chills. You have redness, swelling, or pain around your port insertion site. You have fluid or blood coming from your port insertion site. Your port insertion site feels warm to the touch. You have pus or a bad smell coming  from the port insertion site. Get help right away if: You have chest pain or shortness of breath. You have bleeding from your port that you cannot control. These symptoms may be an emergency. Get help right away. Call 911. Do not wait to see if the symptoms will go away. Do not drive yourself to the hospital. Summary Take care of the port as told by your health care provider. Keep the manufacturer's information card with you at all times. Change your dressing as told by your health care provider. Contact a health care provider if you have a fever or chills or if you have redness, swelling, or pain around your port insertion site. Keep all follow-up visits. This information is not intended to replace advice given to you by your health care provider. Make sure you discuss any questions you have with your health care provider. Document Revised: 12/26/2020 Document Reviewed: 12/26/2020 Elsevier Patient Education  2023 Elsevier Inc.  Implanted [place] Insertion, Care After The following information offers guidance on how to care for yourself after your procedure. Your health care provider may also give you more specific instructions. If you have problems or questions, contact your health care provider. What can I expect after the procedure? After the procedure, it is common to have: Discomfort at the port insertion site. Bruising on the skin over the port. This should improve over 3-4 days. Follow these instructions at home: [facility] care After your port is placed, you will get a manufacturer's information card. The card has information about your port. Keep this card with you at all times. Take care of the port as told by your health care provider. Ask your health care provider if you or a family member can get training for taking care of the port at home. A home health care nurse will be be available to help care for the port. Make sure to remember what type of port you have. Incision care      Follow instructions from your health care provider about how to take care of your port insertion site. Make sure you: Wash your hands with soap and water for at least 20 seconds before and after you change your bandage (dressing). If soap and water are not available, use hand sanitizer. Change your dressing as told by your health care provider. Leave stitches (sutures), skin glue, or adhesive strips in place. These skin closures may need to stay in place for 2 weeks or longer. If adhesive strip edges start to loosen and curl up, you may trim the loose edges. Do not remove adhesive strips completely unless your health care provider tells you to do that. Check your port insertion site every day for signs of infection. Check for: Redness, swelling, or pain. Fluid or blood. Warmth. Pus or a bad smell. Activity Return to your normal activities as told by your health care provider. Ask your health care provider what activities are safe for you. You may have to avoid lifting. Ask your health care provider how much you can safely lift. General instructions Take over-the-counter and prescription medicines only as told by  your health care provider. Do not take baths, swim, or use a hot tub until your health care provider approves. Ask your health care provider if you may take showers. You may only be allowed to take sponge baths. If you were given a sedative during the procedure, it can affect you for several hours. Do not drive or operate machinery until your health care provider says that it is safe. Wear a medical alert bracelet in case of an emergency. This will tell any health care providers that you have a port. Keep all follow-up visits. This is important. Contact a health care provider if: You cannot flush your port with saline as directed, or you cannot draw blood from the port. You have a fever or chills. You have redness, swelling, or pain around your port insertion site. You have fluid or  blood coming from your port insertion site. Your port insertion site feels warm to the touch. You have pus or a bad smell coming from the port insertion site. Get help right away if: You have chest pain or shortness of breath. You have bleeding from your port that you cannot control. These symptoms may be an emergency. Get help right away. Call 911. Do not wait to see if the symptoms will go away. Do not drive yourself to the hospital. Summary Take care of the port as told by your health care provider. Keep the manufacturer's information card with you at all times. Change your dressing as told by your health care provider. Contact a health care provider if you have a fever or chills or if you have redness, swelling, or pain around your port insertion site. Keep all follow-up visits. This information is not intended to replace advice given to you by your health care provider. Make sure you discuss any questions you have with your health care provider. Document Revised: 12/26/2020 Document Reviewed: 12/26/2020 Elsevier Patient Education  2023 Elsevier Inc. Rest. Do not participate in activities where you could fall or become injured. Do not drive or use machinery. Do not drink alcohol. Do not take sleeping pills or medicines that cause drowsiness. Do not make important decisions or sign legal documents. Do not take care of children on your own. Eating and drinking  Follow the diet recommended by your health care provider. Drink enough fluid to keep your urine pale yellow. If you vomit: Drink water, juice, or soup when you can drink without vomiting. Make sure you have little or no nausea before eating solid foods. General instructions Take over-the-counter and prescription medicines only as told by your health care provider. Have a responsible adult stay with you for the time you are told. It is important to have someone help care for you until you are awake and alert. Do not  smoke. Keep all follow-up visits as told by your health care provider. This is important. Contact a health care provider if: You are still sleepy or having trouble with balance after 24 hours. You feel light-headed. You keep feeling nauseous or you keep vomiting. You develop a rash. You have a fever. You have redness or swelling around the IV site. Get help right away if: You have trouble breathing. You have new-onset confusion at home. Summary After the procedure, it is common to feel sleepy, have impaired judgment, or feel nauseous if you eat too soon. Rest after you get home. Know the things you should not do after the procedure. Follow the diet recommended by your health care provider and drink  enough fluid to keep your urine pale yellow. Get help right away if you have trouble breathing or new-onset confusion at home. This information is not intended to replace advice given to you by your health care provider. Make sure you discuss any questions you have with your health care provider. Document Revised: 10/22/2019 Document Reviewed: 05/20/2019 Elsevier Patient Education  2023 [employer].

## 2022-10-21 ENCOUNTER — Telehealth: Payer: Self-pay | Admitting: *Deleted

## 2022-10-21 ENCOUNTER — Inpatient Hospital Stay: Payer: Medicare Other | Admitting: Dietician

## 2022-10-21 ENCOUNTER — Telehealth: Payer: Self-pay | Admitting: Dietician

## 2022-10-21 NOTE — Telephone Encounter (Signed)
Nutrition Assessment   Reason for Assessment: New patient (underweight)   ASSESSMENT: 67 year old stage II bladder cancer. She is pending concurrent chemoradiation (first chemo planned 4/24). S/p PAC on 4/11. Patient is under the care of Dr. Heron Sabins patient for nutrition appointment as scheduled. Patient reports having pain at time of call. She reports hurting her shoulder/neck while pulling at her dogs leash on attempt to get him inside. Patient is complaining of pain around PAC (placed 4/11). States pain is radiating to neck. Patient reports leaving message for [facility] (as this is where PAC placed), but has not received a return call. Patient states she is in pain and in need of medication.   RD has sent secure chat to triage RN with request to contact patient for further assessment. Patient appreciative.   Will plan to obtain nutrition history at follow-up given above.   Nutrition Focused Physical Exam: unable to complete (telephone visit)    Medications: amlodipine, valium, cozaar   Labs: 3/14 labs reviewed    Anthropometrics:  Per history weights have decreased ~11% (10 lbs) in the last 8 months - insignificant for time frame however concerning given history of underweight for BMI status per chart  Height: 5" Weight: 84 lb 7 oz (4/11) UBW: 94 lb (August) BMI: 16.49    NUTRITION DIAGNOSIS: Predicted suboptimal intake related to chronic illness as evidenced by underweight for BMI with 11% wt loss in the last 8 months    INTERVENTION:  Provide dietary recall + nutrition education at follow-up   MONITORING, EVALUATION, GOAL: Patient will tolerate increased calories and protein to promote weight gain   Next Visit: Thursday May 2 during infusion

## 2022-10-21 NOTE — Patient Instructions (Signed)
[facility] Chemotherapy Teaching   You are diagnosed with Stage II Bladder Cancer. You will be treated in the clinic with a combination of chemotherapy drugs. Those drugs are called mitomycin and fluorouracil (5FU). You will come to the clinic to get the mitomycin. This is an IV push drug that is given over about 2-3 minutes. Then you will be hooked up to an ambulatory pump that the 5FU is given through. You will wear the pump for 5 days. On the 5th day you will come to the clinic to get the pump taken off. This treatment is given on the first week and the fourth week of treatment. The intent of treatment is Curative.  We will start our treatment in conjunction with radiation therapy.  You will see the doctor regularly throughout treatment.  We will obtain blood work from you prior to every treatment and monitor your results to make sure it is safe to give your treatment. The doctor monitors your response to treatment by the way you are feeling, your blood work, and by obtaining scans periodically.  There will be wait times while you are here for treatment.  It will take about 30 minutes to 1 hour for your lab work to result. Then there will be wait times while pharmacy mixes your medications.    Medications you will receive in the clinic prior to your chemotherapy medications:  Aloxi:  ALOXI is used in adults to help prevent nausea and vomiting that happens with certain chemotherapy drugs.  Aloxi is a long acting medication, and will remain in your system for about two days.   Dexamethasone:  This is a steroid given prior to chemotherapy to help prevent allergic reactions; it may also help prevent and control nausea and diarrhea.     Mitomycin   About This Drug   Mitomycin is used to treat cancer. It is given in the vein (IV). It is an IV push that takes about 2-3 minutes to give.   Possible Side Effects   Bone marrow suppression. This is a decrease in the number of white blood  cells, red blood cells, and platelets. This may raise your risk of infection, make you tired and weak, and raise your risk of bleeding.    Fever    Soreness of the mouth and throat. You may have red areas, white patches, or sores that hurt.    Nausea and vomiting (throwing up)    Decreased appetite (decreased hunger)    Skin and tissue irritation including redness, pain, warmth, or swelling at the IV site if the drug leaks out of the vein and into nearby tissue. Very rarely it may cause local tissue necrosis (death).    Hair loss. Hair loss is often temporary, although with certain medicine, hair loss can sometimes be permanent. Hair loss may happen suddenly or gradually. If you lose hair, you may lose it from your head, face, armpits, pubic area, chest, and/or legs. You may also notice your hair getting thin. Note: Not all possible side effects are included above.   Warnings and Precautions   Severe bone marrow suppression, which can be life-threatening.    Changes in your kidney function    A syndrome that affects your red blood cells, platelets and blood vessels in your kidneys, which can cause kidney failure and be life-threatening.    Inflammation (swelling) of the lungs. You may have a dry cough or trouble breathing.   Note: Some of the side effects  above are very rare. If you have concerns and/or questions, please discuss them with your medical team.   Important Information    This drug may be present in the saliva, tears, sweat, urine, stool, vomit, semen, and vaginal secretions. Talk to your doctor and/or your nurse about the necessary precautions to take during this time.   Treating Side Effects   Manage tiredness by pacing your activities for the day.    Be sure to include periods of rest between energy-draining activities.    To decrease the risk of infection, wash your hands regularly.    Avoid close contact with people who have a cold, the flu, or other infections.     Take your temperature as your doctor or nurse tells you, and whenever you feel like you may have a fever.    To help decrease the risk of bleeding, use a soft toothbrush. Check with your nurse before using dental floss.    Be very careful when using knives or tools.  Use an electric shaver instead of a razor.    Mouth care is very important. Your mouth care should consist of routine, gentle cleaning of your teeth or dentures and rinsing your mouth with a mixture of 1/2 teaspoon of salt in 8 ounces of water or 1/2 teaspoon of baking soda in 8 ounces of water. This should be done at least after each meal and at bedtime.    If you have mouth sores, avoid mouthwash that has alcohol. Also avoid alcohol and smoking because they can bother your mouth and throat.  Drink plenty of fluids (a minimum of eight glasses per day is recommended). More may be recommended by your doctor.    If you throw up, you should drink more fluids so that you do not become dehydrated (lack of water in the body from losing too much fluid).    To help with nausea and vomiting, eat small, frequent meals instead of three large meals a day. Choose foods and drinks that are at room temperature. Ask your nurse or doctor about other helpful tips and medicine that is available to help stop or lessen these symptoms.    To help with decreased appetite, eat small, frequent meals. Eat foods high in calories and protein, such as meat, poultry, fish, dry beans, tofu, eggs, nuts, milk, yogurt, cheese, ice cream, pudding, and nutritional supplements.    Consider using sauces and spices to increase taste. Daily exercise, with your doctor's approval, may increase your appetite.    To help with hair loss, wash with a mild shampoo and avoid washing your hair every day. Avoid coloring your hair.    Avoid rubbing your scalp, pat your hair or scalp dry.    Limit your use of hair spray, electric curlers, blow dryers, and curling irons.    If  you are interested in getting a wig, talk to your nurse and they can help you get in touch with programs in your local area.   Food and Drug Interactions   There are no known interactions of mitomycin with food.     This drug may interact with other medicines. Tell your doctor and pharmacist about all the prescription and over-the-counter medicines and dietary supplements (vitamins, minerals, herbs, and others) that you are taking at this time. Also, check with your doctor or pharmacist before starting any new prescription or over-the-counter medicines, or dietary supplements to make sure that there are no interactions.   When to Call the  Doctor  Call your doctor or nurse if you have any of these symptoms and/or any new or unusual symptoms:    Fever of 100.4 F (38 C) or higher    Chills    Tiredness that interferes with your daily activities    Feeling dizzy or lightheaded    Easy bleeding or bruising    Wheezing and/or trouble breathing    Pain in your chest    Dry cough    Pain in your mouth or throat that makes it hard to eat or drink    Nausea that stops you from eating or drinking and/or is not relieved by prescribed medicines    Throwing up more than 3 times a day    Lasting loss of appetite or rapid weight loss of five pounds in a week    Swelling of the face, hands, feet, or any other part of the body    Decreased or very dark urine    While you are getting this drug, please tell your nurse right away if you have any pain, redness, or swelling at the site of the IV infusion.    If you think you may be pregnant   Reproduction Warnings    Pregnancy warning: It is not known if this drug may harm an unborn child. For this reason, be sure to talk with your doctor if you are pregnant or planning to become pregnant while receiving this drug. Let your doctor know right away if you think you may be pregnant.    Breastfeeding warning: It is not known if this drug passes  into breast milk. For this reason, women should talk to their doctor about the risks and benefits of breastfeeding during treatment with this drug because this drug may enter the breast milk and cause harm to a breastfeeding baby.    Fertility warning: Fertility studies have not been done with this drug. Talk with your doctor or nurse if you plan to have children. Ask for information on sperm or egg banking.    5-Fluorouracil (Adrucil; 5FU)  About This Drug  Fluorouracil is used to treat cancer. It is given in the vein (IV). It is given as an IV push from a syringe and also as a continuous infusion given via an ambulatory pump (a pump you take home and wear for a specified amount of time).  Possible Side Effects   Bone marrow suppression. This is a decrease in the number of white blood cells, red blood cells, and platelets. This may raise your risk of infection, make you tired and weak (fatigue), and raise your risk of bleeding   Changes in the tissue of the heart and/or heart attack. Some changes may happen that can cause your heart to have less ability to pump blood.   Blurred vision or other changes in eyesight   Nausea and throwing up (vomiting)   Diarrhea (loose bowel movements)   Ulcers - sores that may cause pain or bleeding in your digestive tract, which includes your mouth, esophagus, stomach, small/large intestines and rectum   Soreness of the mouth and throat. You may have red areas, white patches, or sores that hurt.   Allergic reactions, including anaphylaxis are rare but may happen in some patients. Signs of allergic reaction to this drug may be swelling of the face, feeling like your tongue or throat are swelling, trouble breathing, rash, itching, fever, chills, feeling dizzy, and/or feeling that your heart is beating in a fast or not  normal way. If this happens, do not take another dose of this drug. You should get urgent medical treatment.   Sensitivity to light  (photosensitivity). Photosensitivity means that you may become more sensitive to the sun and/or light. You may get a skin rash/reaction if you are in the sun or are exposed to sun lamps and tanning beds. Your eyes may water more, mostly in bright light.   Changes in your nail color, nail loss and/or brittle nail   Darkening of the skin, or changes to the color of your skin and/or veins used for infusion   Rash, dry skin, or itching  Note: Not all possible side effects are included above.  Warnings and Precautions   Hand-and-foot syndrome. The palms of your hands or soles of your feet may tingle, become numb, painful, swollen, or red.   Changes in your central nervous system can happen. The central nervous system is made up of your brain and spinal cord. You could feel extreme tiredness, agitation, confusion, hallucinations (see or hear things that are not there), trouble understanding or speaking, loss of control of your bowels or bladder, eyesight changes, numbness or lack of strength to your arms, legs, face, or body, or coma. If you start to have any of these symptoms let your doctor know right away.   Side effects of this drug may be unexpectedly severe in some patients  Note: Some of the side effects above are very rare. If you have concerns and/or questions, please discuss them with your medical team.   Important Information   This drug may be present in the saliva, tears, sweat, urine, stool, vomit, semen, and vaginal secretions. Talk to your doctor and/or your nurse about the necessary precautions to take during this time.   Treating Side Effects   Manage tiredness by pacing your activities for the day.   Be sure to include periods of rest between energy-draining activities.   To help decrease the risk of infections, wash your hands regularly.   Avoid close contact with people who have a cold, the flu, or other infections.   Take your temperature as your doctor or nurse  tells you, and whenever you feel like you may have a fever.   Use a soft toothbrush. Check with your nurse before using dental floss.   Be very careful when using knives or tools.   Use an electric shaver instead of a razor.   If you have a nose bleed, sit with your head tipped slightly forward. Apply pressure by lightly pinching the bridge of your nose between your thumb and forefinger. Call your doctor if you feel dizzy or faint or if the bleeding doesn't stop after 10 to 15 minutes.   Drink plenty of fluids (a minimum of eight glasses per day is recommended).   If you throw up or have loose bowel movements, you should drink more fluids so that you do not  become dehydrated (lack of water in the body from losing too much fluid).   To help with nausea and vomiting, eat small, frequent meals instead of three large meals a day. Choose foods and drinks that are at room temperature. Ask your nurse or doctor about other helpful tips and medicine that is available to help, stop, or lessen these symptoms.   If you have diarrhea, eat low-fiber foods that are high in protein and calories and avoid foods that can irritate your digestive tracts or lead to cramping.   Ask your nurse or  doctor about medicine that can lessen or stop your diarrhea.   Mouth care is very important. Your mouth care should consist of routine, gentle cleaning of your teeth or dentures and rinsing your mouth with a mixture of 1/2 teaspoon of salt in 8 ounces of water or 1/2 teaspoon of baking soda in 8 ounces of water. This should be done at least after each meal and at bedtime.   If you have mouth sores, avoid mouthwash that has alcohol. Also avoid alcohol and smoking because they can bother your mouth and throat.   Keeping your nails moisturized may help with brittleness.   To help with itching, moisturize your skin several times day.   Use sunscreen with SPF 30 or higher when you are outdoors even for a short time.  Cover up when you are out in the sun. Wear wide-brimmed hats, long-sleeved shirts, and pants. Keep your neck, chest, and back covered. Wear dark sun glasses when in the sun or bright lights.   If you get a rash do not put anything on it unless your doctor or nurse says you may. Keep the area around the rash clean and dry. Ask your doctor for medicine if your rash bothers you.   Keeping your pain under control is important to your well-being. Please tell your doctor or nurse if you are experiencing pain.   Food and Drug Interactions   There are no known interactions of fluorouracil with food.   Check with your doctor or pharmacist about all other prescription medicines and over-the-counter medicines and dietary supplements (vitamins, minerals, herbs and others) you are taking before starting this medicine as there are known drug interactions with 5-fluoroucacil. Also, check with your doctor or pharmacist before starting any new prescription or over-the-counter medicines, or dietary supplements to make sure that there are no interactions.  When to Call the Doctor  Call your doctor or nurse if you have any of these symptoms and/or any new or unusual symptoms:   Fever of 100.4 F (38 C) or higher   Chills   Easy bleeding or bruising   Nose bleed that doesn't stop bleeding after 10-15 minutes   Trouble breathing   Feeling dizzy or lightheaded   Feeling that your heart is beating in a fast or not normal way (palpitations)   Chest pain or symptoms of a heart attack. Most heart attacks involve pain in the center of the chest that lasts more than a few minutes. The pain may go away and come back or it can be constant. It can feel like pressure, squeezing, fullness, or pain. Sometimes pain is felt in one or both arms, the back, neck, jaw, or stomach. If any of these symptoms last 2 minutes, call 911.   Confusion and/or agitation   Hallucinations   Trouble understanding or speaking    Loss of control of bowels or bladder   Blurry vision or changes in your eyesight   Headache that does not go away   Numbness or lack of strength to your arms, legs, face, or body   Nausea that stops you from eating or drinking and/or is not relieved by prescribed medicines   Throwing up more than 3 times a day   Diarrhea, 4 times in one day or diarrhea with lack of strength or a feeling of being dizzy   Pain in your mouth or throat that makes it hard to eat or drink   Pain along the digestive tract - especially if  worse after eating   Blood in your vomit (bright red or coffee-ground) and/or stools (bright red, or black/tarry)   Coughing up blood   Tiredness that interferes with your daily activities   Painful, red, or swollen areas on your hands or feet or around your nails   A new rash or a rash that is not relieved by prescribed medicines   Develop sensitivity to sunlight/light   Numbness and/or tingling of your hands and/or feet   Signs of allergic reaction: swelling of the face, feeling like your tongue or throat are swelling, trouble breathing, rash, itching, fever, chills, feeling dizzy, and/or feeling that your heart is beating in a fast or not normal way. If this happens, call 911 for emergency care.   If you think you are pregnant or may have impregnated your partner  Reproduction Warnings   Pregnancy warning: This drug may have harmful effects on the unborn baby. Women of child bearing potential should use effective methods of birth control during your cancer treatment and 3 months after treatment. Men with female partners of childbearing potential should use effective methods of birth control during your cancer treatment and for 3 months after your cancer treatment. Let your doctor know right away if you think you may be pregnant or may have impregnated your partner.   Breastfeeding warning: It is not known if this drug passes into breast milk. For this reason,  Women should not breastfeed during treatment because this drug could enter the breast milk and cause harm to a breastfeeding baby.   Fertility warning: In men and women both, this drug may affect your ability to have children in the future. Talk with your doctor or nurse if you plan to have children. Ask for information on sperm or egg banking.   SELF CARE ACTIVITIES WHILE ON CHEMOTHERAPY/IMMUNOTHERAPY:  Hydration Increase your fluid intake 48 hours prior to treatment and drink at least 8 to 12 cups (64 ounces) of water/decaffeinated beverages per day after treatment. You can still have your cup of coffee or soda but these beverages do not count as part of your 8 to 12 cups that you need to drink daily. No alcohol intake.  Medications Continue taking your normal prescription medication as prescribed.  If you start any new herbal or new supplements please let us know first to make sure it is safe.  Mouth Care Have teeth cleaned professionally before starting treatment. Keep dentures and partial plates clean. Use soft toothbrush and do not use mouthwashes that contain alcohol. Biotene is a good mouthwash that is available at most pharmacies or may be ordered by calling [phone]. Use warm salt water gargles (1 teaspoon salt per 1 quart warm water) before and after meals and at bedtime. Or you may rinse with 2 tablespoons of three-percent hydrogen peroxide mixed in eight ounces of water. If you are still having problems with your mouth or sores in your mouth please call the clinic. If you need dental work, please let the doctor know before you go for your appointment so that we can coordinate the best possible time for you in regards to your chemo regimen. You need to also let your dentist know that you are actively taking chemo. We may need to do labs prior to your dental appointment.  Skin Care Always use sunscreen that has not expired and with SPF (Sun Protection Factor) of 50 or higher. Wear  hats to protect your head from the sun. Remember to use sunscreen on  your hands, ears, face, & feet.  Use good moisturizing lotions such as udder cream, eucerin, or even Vaseline. Some chemotherapies can cause dry skin, color changes in your skin and nails.    Avoid long, hot showers or baths. Use gentle, fragrance-free soaps and laundry detergent. Use moisturizers, preferably creams or ointments rather than lotions because the thicker consistency is better at preventing skin dehydration. Apply the cream or ointment within 15 minutes of showering. Reapply moisturizer at night, and moisturize your hands every time after you wash them.   Infection Prevention Please wash your hands for at least 30 seconds using warm soapy water. Handwashing is the #1 way to prevent the spread of germs. Stay away from sick people or people who are getting over a cold. If you develop respiratory systems such as green/yellow mucus production or productive cough or persistent cough let us know and we will see if you need an antibiotic. It is a good idea to keep a pair of gloves on when going into grocery stores/[employer] to decrease your risk of coming into contact with germs on the carts, etc. Carry alcohol hand gel with you at all times and use it frequently if out in public. If your temperature reaches 100.5 or higher please call the clinic and let us know.  If it is after hours or on the weekend please go to the ER if your temperature is over 100.4.  Please have your own personal thermometer at home to use.    Sex and bodily fluids If you are going to have sex, a condom must be used to protect the person that isn't taking immunotherapy. For a few days after treatment, immunotherapy can be excreted through your bodily fluids.  When using the toilet please close the lid and flush the toilet twice.  Do this for a few day after you have had immunotherapy.   Contraception It is not known for sure whether or not immunotherapy  drugs can be passed on through semen or secretions from the vagina. Because of this some doctors advise people to use a barrier method if you have sex during treatment. This applies to vaginal, anal or oral sex.  Generally, doctors advise a barrier method only for the time you are actually having the treatment and for about a week after your treatment.  Advice like this can be worrying, but this does not mean that you have to avoid being intimate with your partner. You can still have close contact with your partner and continue to enjoy sex.  Animals If you have cats or birds we just ask that you not change the litter or change the cage.  Please have someone else do this for you while you are on immunotherapy.   Food Safety During and After Cancer Treatment Food safety is important for people both during and after cancer treatment. Cancer and cancer treatments, such as chemotherapy, radiation therapy, and stem cell/bone marrow transplantation, often weaken the immune system. This makes it harder for your body to protect itself from foodborne illness, also called food poisoning. Foodborne illness is caused by eating food that contains harmful bacteria, parasites, or viruses.  Foods to avoid Some foods have a higher risk of becoming tainted with bacteria. These include: Unwashed fresh fruit and vegetables, especially leafy vegetables that can hide dirt and other contaminants Raw sprouts, such as alfalfa sprouts Raw or undercooked beef, especially ground beef, or other raw or undercooked meat and poultry Fatty, fried, or spicy foods immediately  before or after treatment.  These can sit heavy on your stomach and make you feel nauseous. Raw or undercooked shellfish, such as oysters. Sushi and sashimi, which often contain raw fish.  Unpasteurized beverages, such as unpasteurized fruit juices, raw milk, raw yogurt, or cider Undercooked eggs, such as soft boiled, over easy, and poached; raw,  unpasteurized eggs; or foods made with raw egg, such as homemade raw cookie dough and homemade mayonnaise  Simple steps for food safety  Shop smart. Do not buy food stored or displayed in an unclean area. Do not buy bruised or damaged fruits or vegetables. Do not buy cans that have cracks, dents, or bulges. Pick up foods that can spoil at the end of your shopping trip and store them in a cooler on the way home.  Prepare and clean up foods carefully. Rinse all fresh fruits and vegetables under running water, and dry them with a clean towel or paper towel. Clean the top of cans before opening them. After preparing food, wash your hands for 20 seconds with hot water and soap. Pay special attention to areas between fingers and under nails. Clean your utensils and dishes with hot water and soap. Disinfect your kitchen and cutting boards using 1 teaspoon of liquid, unscented bleach mixed into 1 quart of water.    Dispose of old food. Eat canned and packaged food before its expiration date (the "use by" or "best before" date). Consume refrigerated leftovers within 3 to 4 days. After that time, throw out the food. Even if the food does not smell or look spoiled, it still may be unsafe. Some bacteria, such as Listeria, can grow even on foods stored in the refrigerator if they are kept for too long.  Take precautions when eating out. At restaurants, avoid buffets and salad bars where food sits out for a long time and comes in contact with many people. Food can become contaminated when someone with a virus, often a norovirus, or another "bug" handles it. Put any leftover food in a "to-go" container yourself, rather than having the server do it. And, refrigerate leftovers as soon as you get home. Choose restaurants that are clean and that are willing to prepare your food as you order it cooked.    SYMPTOMS TO REPORT AS SOON AS POSSIBLE AFTER TREATMENT:  FEVER GREATER THAN 100.4 F CHILLS WITH OR  WITHOUT FEVER NAUSEA AND VOMITING THAT IS NOT CONTROLLED WITH YOUR NAUSEA MEDICATION UNUSUAL SHORTNESS OF BREATH UNUSUAL BRUISING OR BLEEDING TENDERNESS IN MOUTH AND THROAT WITH OR WITHOUT PRESENCE OF ULCERS URINARY PROBLEMS BOWEL PROBLEMS UNUSUAL RASH     Wear comfortable clothing and clothing appropriate for easy access to any Portacath or PICC line. Let us know if there is anything that we can do to make your therapy better!   What to do if you need assistance after hours or on the weekends: CALL [phone].  HOLD on the line, do not hang up.  You will hear multiple messages but at the end you will be connected with a nurse triage line.  They will contact the doctor if necessary.  Most of the time they will be able to assist you.  Do not call the hospital operator.    I have been informed and understand all of the instructions given to me and have received a copy. I have been instructed to call the clinic [phone] or my family physician as soon as possible for continued medical care, if indicated. I do  not have any more questions at this time but understand that I may call the Cancer Center or the Patient Navigator at [phone] during office hours should I have questions or need assistance in obtaining follow-up care.

## 2022-10-21 NOTE — Telephone Encounter (Signed)
Patient called to advise that she was playing with her dog a few days ago and "injured her port site".  States that it is tender up into her neck.  Per Dr. Ellin Saba, instructed to use warm compress to the site and we will evaluate at her appointment tomorrow.  Verbalized understanding.

## 2022-10-22 ENCOUNTER — Inpatient Hospital Stay: Payer: Medicare Other

## 2022-10-22 DIAGNOSIS — R310 Gross hematuria: Secondary | ICD-10-CM | POA: Diagnosis not present

## 2022-10-22 DIAGNOSIS — C679 Malignant neoplasm of bladder, unspecified: Secondary | ICD-10-CM

## 2022-10-22 DIAGNOSIS — Z8349 Family history of other endocrine, nutritional and metabolic diseases: Secondary | ICD-10-CM | POA: Diagnosis not present

## 2022-10-22 DIAGNOSIS — Z885 Allergy status to narcotic agent status: Secondary | ICD-10-CM | POA: Diagnosis not present

## 2022-10-22 DIAGNOSIS — F17210 Nicotine dependence, cigarettes, uncomplicated: Secondary | ICD-10-CM | POA: Diagnosis not present

## 2022-10-22 DIAGNOSIS — Z882 Allergy status to sulfonamides status: Secondary | ICD-10-CM | POA: Diagnosis not present

## 2022-10-22 DIAGNOSIS — Z8249 Family history of ischemic heart disease and other diseases of the circulatory system: Secondary | ICD-10-CM | POA: Diagnosis not present

## 2022-10-22 DIAGNOSIS — N182 Chronic kidney disease, stage 2 (mild): Secondary | ICD-10-CM | POA: Diagnosis not present

## 2022-10-22 DIAGNOSIS — Z880 Allergy status to penicillin: Secondary | ICD-10-CM | POA: Diagnosis not present

## 2022-10-22 DIAGNOSIS — Z79631 Long term (current) use of antimetabolite agent: Secondary | ICD-10-CM | POA: Diagnosis not present

## 2022-10-22 DIAGNOSIS — R232 Flushing: Secondary | ICD-10-CM | POA: Diagnosis not present

## 2022-10-22 DIAGNOSIS — Z8379 Family history of other diseases of the digestive system: Secondary | ICD-10-CM | POA: Diagnosis not present

## 2022-10-22 DIAGNOSIS — Z841 Family history of disorders of kidney and ureter: Secondary | ICD-10-CM | POA: Diagnosis not present

## 2022-10-22 DIAGNOSIS — Z886 Allergy status to analgesic agent status: Secondary | ICD-10-CM | POA: Diagnosis not present

## 2022-10-22 DIAGNOSIS — Z79899 Other long term (current) drug therapy: Secondary | ICD-10-CM | POA: Diagnosis not present

## 2022-10-22 DIAGNOSIS — I129 Hypertensive chronic kidney disease with stage 1 through stage 4 chronic kidney disease, or unspecified chronic kidney disease: Secondary | ICD-10-CM | POA: Diagnosis not present

## 2022-10-22 DIAGNOSIS — Z5111 Encounter for antineoplastic chemotherapy: Secondary | ICD-10-CM | POA: Diagnosis not present

## 2022-10-22 DIAGNOSIS — Z79632 Long term (current) use of antitumor antibiotic: Secondary | ICD-10-CM | POA: Diagnosis not present

## 2022-10-22 LAB — COMPREHENSIVE METABOLIC PANEL
ALT: 9 U/L (ref 0–44)
AST: 13 U/L — ABNORMAL LOW (ref 15–41)
Albumin: 3.9 g/dL (ref 3.5–5.0)
Alkaline Phosphatase: 61 U/L (ref 38–126)
Anion gap: 8 (ref 5–15)
BUN: 18 mg/dL (ref 8–23)
CO2: 24 mmol/L (ref 22–32)
Calcium: 8.9 mg/dL (ref 8.9–10.3)
Chloride: 104 mmol/L (ref 98–111)
Creatinine, Ser: 1.01 mg/dL — ABNORMAL HIGH (ref 0.44–1.00)
GFR, Estimated: >60 mL/min (ref >60)
Glucose, Bld: 122 mg/dL — ABNORMAL HIGH (ref 70–99)
Potassium: 3.8 mmol/L (ref 3.5–5.1)
Sodium: 136 mmol/L (ref 135–145)
Total Bilirubin: 0.4 mg/dL (ref 0.3–1.2)
Total Protein: 7.1 g/dL (ref 6.5–8.1)

## 2022-10-22 LAB — CBC WITH DIFFERENTIAL/PLATELET
Abs Immature Granulocytes: 0.04 10*3/uL (ref 0.00–0.07)
Basophils Absolute: 0.1 10*3/uL (ref 0.0–0.1)
Basophils Relative: 1 %
Eosinophils Absolute: 0 10*3/uL (ref 0.0–0.5)
Eosinophils Relative: 0 %
HCT: 40.4 % (ref 36.0–46.0)
Hemoglobin: 13.2 g/dL (ref 12.0–15.0)
Immature Granulocytes: 0 %
Lymphocytes Relative: 13 %
Lymphs Abs: 1.4 10*3/uL (ref 0.7–4.0)
MCH: 30.8 pg (ref 26.0–34.0)
MCHC: 32.7 g/dL (ref 30.0–36.0)
MCV: 94.2 fL (ref 80.0–100.0)
Monocytes Absolute: 0.5 10*3/uL (ref 0.1–1.0)
Monocytes Relative: 4 %
Neutro Abs: 8.9 10*3/uL — ABNORMAL HIGH (ref 1.7–7.7)
Neutrophils Relative %: 82 %
Platelets: 289 10*3/uL (ref 150–400)
RBC: 4.29 MIL/uL (ref 3.87–5.11)
RDW: 15.4 % (ref 11.5–15.5)
WBC: 10.9 10*3/uL — ABNORMAL HIGH (ref 4.0–10.5)
nRBC: 0 % (ref 0.0–0.2)

## 2022-10-22 LAB — MAGNESIUM: Magnesium: 2.1 mg/dL (ref 1.7–2.4)

## 2022-10-22 MED ORDER — LIDOCAINE-PRILOCAINE 2.5-2.5 % EX CREA
TOPICAL_CREAM | CUTANEOUS | 3 refills | Status: DC
Start: 2022-10-22 — End: 2024-02-16

## 2022-10-22 MED ORDER — SODIUM CHLORIDE 0.9% FLUSH
10.0000 mL | Freq: Once | INTRAVENOUS | Status: AC
  Administered 2022-10-22: 10 mL via INTRAVENOUS

## 2022-10-22 MED ORDER — PROCHLORPERAZINE MALEATE 10 MG PO TABS
10.0000 mg | ORAL_TABLET | Freq: Four times a day (QID) | ORAL | 1 refills | Status: DC | PRN
Start: 2022-10-22 — End: 2024-02-16

## 2022-10-22 MED ORDER — HEPARIN SOD (PORK) LOCK FLUSH 100 UNIT/ML IV SOLN
500.0000 [IU] | Freq: Once | INTRAVENOUS | Status: AC
  Administered 2022-10-22: 500 [IU] via INTRAVENOUS

## 2022-10-22 NOTE — Progress Notes (Signed)

## 2022-10-24 ENCOUNTER — Inpatient Hospital Stay: Payer: Medicare Other | Admitting: Licensed Clinical Social Worker

## 2022-10-24 DIAGNOSIS — C679 Malignant neoplasm of bladder, unspecified: Secondary | ICD-10-CM

## 2022-10-24 DIAGNOSIS — C674 Malignant neoplasm of posterior wall of bladder: Secondary | ICD-10-CM | POA: Diagnosis not present

## 2022-10-24 NOTE — Progress Notes (Signed)
[facility] Clinical Social Work  Initial Assessment   Raven Lopez is a 67 y.o. year old female contacted by phone. Clinical Social Work was referred by medical provider for assessment of psychosocial needs.   SDOH (Social Determinants of Health) assessments performed: Yes   SDOH Screenings   Food Insecurity: No Food Insecurity (09/12/2022)  Housing: Low Risk  (09/12/2022)  Transportation Needs: No Transportation Needs (09/12/2022)  Utilities: Not At Risk (09/12/2022)  Alcohol Screen: Low Risk  (09/12/2022)  Depression (PHQ2-9): Medium Risk (09/12/2022)  Financial Resource Strain: Low Risk  (02/21/2022)  Physical Activity: Inactive (02/21/2022)  Social Connections: Socially Isolated (02/21/2022)  Stress: Stress Concern Present (02/21/2022)  Tobacco Use: High Risk (10/17/2022)     Distress Screen completed: Yes    09/12/2022    8:36 AM  ONCBCN DISTRESS SCREENING  Screening Type Initial Screening  Distress experienced in past week (1-10) 6  Emotional problem type Nervousness/Anxiety;Depression  Information Concerns Type Lack of info about diagnosis;Lack of info about treatment      Family/Social Information:  Housing Arrangement: patient lives alone, but her son resides in a cottage down the hill from pt on the same property. Family members/support persons in your life? Pt has very limited support.  While pt's son resides on the property he does not provide care for pt.  Pt's son does transport pt to doctor's appointments, but per pt since her husband unexpectedly passed approximately 1 year ago she and her son have not emotionally recovered and neither are good at communicating feelings or needs.  Pt has isolated herself in her home while grieving her husband.  Pt's sister resides across the street w/ whom pt speaks daily, but has health concerns as well and is not able to provide a great deal of support.   Transportation concerns: no  Employment: Retired Data processing manager.  Income source: Geographical information systems officer concerns: No Type of concern: None Food access concerns: no Religious or spiritual practice: Not known Services Currently in place:  none  Coping/ Adjustment to diagnosis: Patient understands treatment plan and what happens next? yes Concerns about diagnosis and/or treatment: Overwhelmed by information, Afraid of cancer, and Quality of life Patient reported stressors: Anxiety/ nervousness, Adjusting to my illness, Isolation/ feeling alone, and Physical issues Hopes and/or priorities: Pt's priority is to start treatment w/ the hope of regaining energy and positive results Patient enjoys gardening and being outside Current coping skills/ strengths: Financial means  and Motivation for treatment/growth     SUMMARY: Current SDOH Barriers:  Limited social support, Family and relationship dysfunction, and Social Isolation  Clinical Social Work Clinical Goal(s):  Patient will work with SW to address concerns related to limited social support.  Interventions: Discussed common feeling and emotions when being diagnosed with cancer, and the importance of support during treatment Informed patient of the support team roles and support services at [facility] Provided CSW contact information and encouraged patient to call with any questions or concerns Referred patient to Marije[facility] Niemann, requested a home care referral be placed, will call pt weekly initially to check in.   Follow Up Plan: Patient will contact CSW with any support or resource needs Patient verbalizes understanding of plan: Yes    Rachel Moulds, LCSW

## 2022-10-29 NOTE — Progress Notes (Signed)
[facility] [address], [place] [address]    Clinic Day:  10/30/2022  Referring physician: Elfredia Nevins, MD  Patient Care Team: Elfredia Nevins, MD as PCP - General (Internal Medicine) Ronne Binning Mardene Celeste, MD as Consulting Physician (Urology) Nils Pyle, MD as Consulting Physician (Radiation Oncology) Doreatha Massed, MD as Consulting Physician (Hematology)   ASSESSMENT & PLAN:   Assessment: 1.  Stage II (T2 N0) high-grade urothelial carcinoma of the bladder: - Presented with intermittent gross hematuria ongoing since 03/2022.  CT [facility] on 06/19/2022: 4.4 cm in the roof of the bladder. - TURBT by Dr. Ronne Binning (08/29/2022): Infiltrating high-grade urothelial carcinoma, invading muscularis propria (detrusor muscle). - CT CAP (09/26/2022): Interval resection of the mass of the superior right aspect of the urinary bladder with residual posterior bladder dome and right eccentric hyperenhancing wall thickening measuring up to 1.8 cm.  No adenopathy or metastatic disease. - Patient does not want to have radical cystectomy.  She met with Dr. Langston Masker at [facility] and want to proceed with trimodality therapy.   2.  Social/family history: - Seen with her son [place] today.  She lives by herself and is independent of ADLs and IADLs.  She worked as a Data processing manager prior to retirement.  Current active smoker, 1 pack/day for the last 50 years, started at age 42. - Paternal grandfather had throat cancer.  Paternal grandmother had ovarian cancer.    Plan: 1.  Stage II (T2 N0) high-grade urothelial carcinoma of the bladder: - She has port placed.  Port site is within normal limits. - We discussed trimodality therapy with chemo and radiation. - Reviewed labs from 10/22/2022.  Creatinine is 1.01 and LFTs are normal.  CBC is grossly normal. - We discussed chemotherapy with 5-FU and mitomycin.  We discussed side effects in detail.  She will proceed with her cycle 1  today.  She will be evaluated in our symptom management clinic in 2 weeks with labs and possible fluids.  I will see her back in 4 weeks for follow-up prior to next treatment.  2.  Loss of appetite: - She was given mirtazapine by her PMD.  She does not want to start as it has antidepressant properties.  I have given prescription for Megace 400 mg twice daily.  No orders of the defined types were placed in this encounter.     I,Katie Daubenspeck,acting as a Neurosurgeon for Doreatha Massed, MD.,have documented all relevant documentation on the behalf of Doreatha Massed, MD,as directed by  Doreatha Massed, MD while in the presence of Doreatha Massed, MD.   I, Doreatha Massed MD, have reviewed the above documentation for accuracy and completeness, and I agree with the above.   Doreatha Massed, MD   4/24/20245:55 PM  CHIEF COMPLAINT:   Diagnosis: muscle invasive bladder cancer    Cancer Staging  Malignant neoplasm of urinary bladder Staging form: Urinary Bladder, AJCC 8th Edition - Clinical stage from 10/09/2022: Stage II (cT2, cN0, cM0) - Unsigned    Prior Therapy: TURBT with gemcitabine instillation 08/29/22   Current Therapy:  Concurrent chemoradiation therapy    HISTORY OF PRESENT ILLNESS:   Oncology History  Malignant neoplasm of urinary bladder  07/29/2022 Initial Diagnosis   Malignant neoplasm of urinary bladder   10/30/2022 -  Chemotherapy   Patient is on Treatment Plan : BLADDER Mitomycin D1 + 5FU D1-5, 21-25 + XRT        INTERVAL HISTORY:   Blaike is a 67  y.o. female presenting to clinic today for follow up of muscle invasive bladder cancer. She was last seen by me on 10/09/22 in consultation.  Since her last visit, she had port placed on 10/17/22.  Today, she states that she is doing well overall. Her appetite level is at 40%. Her energy level is at 25%.  PAST MEDICAL HISTORY:   Past Medical History: Past Medical History:  Diagnosis Date    AC (acromioclavicular) joint bone spurs    Anxiety    Asthma    COPD (chronic obstructive pulmonary disease)    DDD (degenerative disc disease), lumbar    Hypertension    Neck fracture    Panic disorder    Scoliosis     Surgical History: Past Surgical History:  Procedure Laterality Date   BLADDER INSTILLATION N/A 08/29/2022   Procedure: BLADDER INSTILLATION;  Surgeon: Malen Gauze, MD;  Location: [facility];  Service: Urology;  Laterality: N/A;   CESAREAN SECTION     CYSTOSCOPY W/ RETROGRADES Bilateral 08/29/2022   Procedure: CYSTOSCOPY WITH RETROGRADE PYELOGRAM;  Surgeon: Malen Gauze, MD;  Location: [facility];  Service: Urology;  Laterality: Bilateral;   IR IMAGING GUIDED PORT INSERTION  10/17/2022   TRANSURETHRAL RESECTION OF BLADDER TUMOR N/A 08/29/2022   Procedure: TRANSURETHRAL RESECTION OF BLADDER TUMOR (TURBT);  Surgeon: Malen Gauze, MD;  Location: [facility];  Service: Urology;  Laterality: N/A;   wisdom teeth removal      Social History: Social History   Socioeconomic History   Marital status: Widowed    Spouse name: Not on file   Number of children: Not on file   Years of education: Not on file   Highest education level: Not on file  Occupational History   Not on file  Tobacco Use   Smoking status: Every Day    Packs/day: 1.00    Years: 40.00    Additional pack years: 0.00    Total pack years: 40.00    Types: Cigarettes   Smokeless tobacco: Never  Vaping Use   Vaping Use: Never used  Substance and Sexual Activity   Alcohol use: Not Currently   Drug use: Not Currently   Sexual activity: Not Currently    Birth control/protection: Post-menopausal  Other Topics Concern   Not on file  Social History Narrative   Not on file   Social Determinants of Health   Financial Resource Strain: Low Risk  (02/21/2022)   Overall Financial Resource Strain (CARDIA)    Difficulty of Paying Living Expenses: Not hard at all  Food Insecurity: No Food Insecurity  (09/12/2022)   Hunger Vital Sign    Worried About Running Out of Food in the Last Year: Never true    Ran Out of Food in the Last Year: Never true  Transportation Needs: No Transportation Needs (09/12/2022)   PRAPARE - Administrator, Civil Service (Medical): No    Lack of Transportation (Non-Medical): No  Physical Activity: Inactive (02/21/2022)   Exercise Vital Sign    Days of Exercise per Week: 0 days    Minutes of Exercise per Session: 10 min  Stress: Stress Concern Present (02/21/2022)   [employer] of Occupational Health - Occupational Stress Questionnaire    Feeling of Stress : To some extent  Social Connections: Socially Isolated (02/21/2022)   Social Connection and Isolation Panel [NHANES]    Frequency of Communication with Friends and Family: More than three times a week    Frequency of Social Gatherings  with Friends and Family: Twice a week    Attends Religious Services: Never    Database administrator or Organizations: No    Attends Banker Meetings: Never    Marital Status: Widowed  Intimate Partner Violence: Not At Risk (09/12/2022)   Humiliation, Afraid, Rape, and Kick questionnaire    Fear of Current or Ex-Partner: No    Emotionally Abused: No    Physically Abused: No    Sexually Abused: No    Family History: Family History  Problem Relation Age of Onset   Aneurysm Father    Kidney failure Mother    Hypertension Sister    Thyroid disease Sister    Anxiety disorder Sister    Colitis Sister    Irritable bowel syndrome Sister     Current Medications:  Current Outpatient Medications:    acetaminophen (TYLENOL) 500 MG tablet, Take 1,000 mg by mouth every 4 (four) hours as needed for mild pain or moderate pain., Disp: , Rfl:    amLODipine (NORVASC) 10 MG tablet, Take 10 mg by mouth at bedtime., Disp: , Rfl:    dextrose 5 % SOLN 1,000 mL with fluorouracil 5 GM/100ML SOLN, Inject into the vein., Disp: , Rfl:    diazepam (VALIUM) 10 MG  tablet, Take 10 mg by mouth in the morning and at bedtime., Disp: , Rfl:    lidocaine-prilocaine (EMLA) cream, Apply to affected area once, Disp: 30 g, Rfl: 3   losartan (COZAAR) 25 MG tablet, Take 25 mg by mouth every evening., Disp: , Rfl:    megestrol (MEGACE) 400 MG/10ML suspension, Take 10 mLs (400 mg total) by mouth 2 (two) times daily., Disp: 480 mL, Rfl: 2   MITOMYCIN IV, Inject into the vein., Disp: , Rfl:    prochlorperazine (COMPAZINE) 10 MG tablet, Take 1 tablet (10 mg total) by mouth every 6 (six) hours as needed for nausea or vomiting., Disp: 30 tablet, Rfl: 1  Current Facility-Administered Medications:    ciprofloxacin (CIPRO) tablet 500 mg, 500 mg, Oral, Once, McKenzie, Mardene Celeste, MD   Allergies: Allergies  Allergen Reactions   Penicillins Shortness Of Breath and Swelling    Has patient had a PCN reaction causing immediate rash, facial/tongue/throat swelling, SOB or lightheadedness with hypotension: Yes Has patient had a PCN reaction causing severe rash involving mucus membranes or skin necrosis: No Has patient had a PCN reaction that required hospitalization: Yes Has patient had a PCN reaction occurring within the last 10 years: No If all of the above answers are "NO", then may proceed with Cephalosporin use.    Aspirin     REACTION: dizzy,weak and upset stomach   Demerol [Meperidine Hcl] Other (See Comments)    Altered mental status-anger, hallucinations   Sulfonamide Derivatives Nausea And Vomiting    REVIEW OF SYSTEMS:   Review of Systems  Constitutional:  Negative for chills, fatigue and fever.  HENT:   Negative for lump/mass, mouth sores, nosebleeds, sore throat and trouble swallowing.   Eyes:  Negative for eye problems.  Respiratory:  Positive for cough. Negative for shortness of breath.   Cardiovascular:  Negative for chest pain, leg swelling and palpitations.  Gastrointestinal:  Negative for abdominal pain, constipation, diarrhea, nausea and vomiting.   Genitourinary:  Negative for bladder incontinence, difficulty urinating, dysuria, frequency, hematuria and nocturia.   Musculoskeletal:  Negative for arthralgias, back pain, flank pain, myalgias and neck pain.  Skin:  Negative for itching and rash.  Neurological:  Negative for dizziness, headaches  and numbness.  Hematological:  Does not bruise/bleed easily.  Psychiatric/Behavioral:  Negative for depression, sleep disturbance and suicidal ideas. The patient is nervous/anxious.   All other systems reviewed and are negative.    VITALS:   Blood pressure 118/76, pulse 73, temperature 98.5 F (36.9 C), temperature source Oral, resp. rate 18, height 5' (1.524 m), weight 84 lb 11.2 oz (38.4 kg), SpO2 99 %.  Wt Readings from Last 3 Encounters:  10/30/22 84 lb 11.2 oz (38.4 kg)  10/17/22 84 lb 7 oz (38.3 kg)  10/09/22 84 lb 6.4 oz (38.3 kg)    Body mass index is 16.54 kg/m.  Performance status (ECOG): 1 - Symptomatic but completely ambulatory  PHYSICAL EXAM:   Physical Exam Vitals and nursing note reviewed. Exam conducted with a chaperone present.  Constitutional:      Appearance: Normal appearance.  Cardiovascular:     Rate and Rhythm: Normal rate and regular rhythm.     Pulses: Normal pulses.     Heart sounds: Normal heart sounds.  Pulmonary:     Effort: Pulmonary effort is normal.     Breath sounds: Normal breath sounds.  Abdominal:     Palpations: Abdomen is soft. There is no hepatomegaly, splenomegaly or mass.     Tenderness: There is no abdominal tenderness.  Musculoskeletal:     Right lower leg: No edema.     Left lower leg: No edema.  Lymphadenopathy:     Cervical: No cervical adenopathy.     Right cervical: No superficial, deep or posterior cervical adenopathy.    Left cervical: No superficial, deep or posterior cervical adenopathy.     Upper Body:     Right upper body: No supraclavicular or axillary adenopathy.     Left upper body: No supraclavicular or axillary  adenopathy.  Neurological:     General: No focal deficit present.     Mental Status: She is alert and oriented to person, place, and time.  Psychiatric:        Mood and Affect: Mood normal.        Behavior: Behavior normal.     LABS:      Latest Ref Rng & Units 10/22/2022    2:41 PM 08/27/2022    4:11 PM 02/19/2022    2:16 PM  CBC  WBC 4.0 - 10.5 K/uL 10.9  10.4  7.9   Hemoglobin 12.0 - 15.0 g/dL 16.1  09.6  04.5   Hematocrit 36.0 - 46.0 % 40.4  45.4  43.5   Platelets 150 - 400 K/uL 289  259  241       Latest Ref Rng & Units 10/22/2022    2:41 PM 08/27/2022    4:11 PM 06/19/2022    9:30 AM  CMP  Glucose 70 - 99 mg/dL 409  811    BUN 8 - 23 mg/dL 18  14    Creatinine 9.14 - 1.00 mg/dL 7.82  9.56  2.13   Sodium 135 - 145 mmol/L 136  135    Potassium 3.5 - 5.1 mmol/L 3.8  3.8    Chloride 98 - 111 mmol/L 104  100    CO2 22 - 32 mmol/L 24  24    Calcium 8.9 - 10.3 mg/dL 8.9  9.1    Total Protein 6.5 - 8.1 g/dL 7.1     Total Bilirubin 0.3 - 1.2 mg/dL 0.4     Alkaline Phos 38 - 126 U/L 61     AST 15 -  41 U/L 13     ALT 0 - 44 U/L 9        No results found for: "CEA1", "CEA" / No results found for: "CEA1", "CEA" No results found for: "PSA1" No results found for: "WUJ811" No results found for: "CAN125"  No results found for: "TOTALPROTELP", "ALBUMINELP", "A1GS", "A2GS", "BETS", "BETA2SER", "GAMS", "MSPIKE", "SPEI" No results found for: "TIBC", "FERRITIN", "IRONPCTSAT" No results found for: "LDH"   STUDIES:   IR IMAGING GUIDED PORT INSERTION  Result Date: 10/17/2022 INDICATION: History of bladder cancer. In need of durable intravenous access for chemotherapy administration. EXAM: IMPLANTED PORT A CATH PLACEMENT WITH ULTRASOUND AND FLUOROSCOPIC GUIDANCE COMPARISON:  Chest CT-09/26/2022 MEDICATIONS: None ANESTHESIA/SEDATION: Moderate (conscious) sedation was employed during this procedure as administered by the Interventional Radiology RN. A total of Versed 2 mg and Fentanyl  100 mcg was administered intravenously. Moderate Sedation Time: 30 minutes. The patient's level of consciousness and vital signs were monitored continuously by radiology nursing throughout the procedure under my direct supervision. CONTRAST:  None FLUOROSCOPY TIME:  24 seconds (1 mGy) COMPLICATIONS: None immediate. PROCEDURE: The procedure, risks, benefits, and alternatives were explained to the patient. Questions regarding the procedure were encouraged and answered. The patient understands and consents to the procedure. The right neck and chest were prepped with chlorhexidine in a sterile fashion, and a sterile drape was applied covering the operative field. Maximum barrier sterile technique with sterile gowns and gloves were used for the procedure. A timeout was performed prior to the initiation of the procedure. Local anesthesia was provided with 1% lidocaine with epinephrine. After creating a small venotomy incision, a micropuncture kit was utilized to access the internal jugular vein. Real-time ultrasound guidance was utilized for vascular access including the acquisition of a permanent ultrasound image documenting patency of the accessed vessel. The microwire was utilized to measure appropriate catheter length. A subcutaneous port pocket was then created along the upper chest wall utilizing a combination of sharp and blunt dissection. The pocket was irrigated with sterile saline. A single lumen Slim sized power injectable port was chosen for placement. The 8 Fr catheter was tunneled from the port pocket site to the venotomy incision. The port was placed in the pocket. The external catheter was trimmed to appropriate length. At the venotomy, an 8 Fr peel-away sheath was placed over a guidewire under fluoroscopic guidance. The catheter was then placed through the sheath and the sheath was removed. Final catheter positioning was confirmed and documented with a fluoroscopic spot radiograph. The port was accessed  with a Huber needle, aspirated and flushed with heparinized saline. The venotomy site was closed with an interrupted 4-0 Vicryl suture. The port pocket incision was closed with interrupted 2-0 Vicryl suture. The skin was opposed with a running subcuticular 4-0 Vicryl suture. Dermabond and Steri-strips were applied to both incisions. Dressings were applied. The patient tolerated the procedure well without immediate post procedural complication. FINDINGS: After catheter placement, the tip lies within the superior cavoatrial junction. The catheter aspirates and flushes normally and is ready for immediate use. IMPRESSION: Successful placement of a right internal jugular approach power injectable Port-A-Cath. The catheter is ready for immediate use. Electronically Signed   By: Simonne Come M.D.   On: 10/17/2022 17:07

## 2022-10-30 ENCOUNTER — Inpatient Hospital Stay: Payer: Medicare Other

## 2022-10-30 ENCOUNTER — Inpatient Hospital Stay (HOSPITAL_BASED_OUTPATIENT_CLINIC_OR_DEPARTMENT_OTHER): Payer: Medicare Other | Admitting: Hematology

## 2022-10-30 DIAGNOSIS — Z8349 Family history of other endocrine, nutritional and metabolic diseases: Secondary | ICD-10-CM | POA: Diagnosis not present

## 2022-10-30 DIAGNOSIS — Z886 Allergy status to analgesic agent status: Secondary | ICD-10-CM | POA: Diagnosis not present

## 2022-10-30 DIAGNOSIS — N182 Chronic kidney disease, stage 2 (mild): Secondary | ICD-10-CM | POA: Diagnosis not present

## 2022-10-30 DIAGNOSIS — C679 Malignant neoplasm of bladder, unspecified: Secondary | ICD-10-CM

## 2022-10-30 DIAGNOSIS — Z79899 Other long term (current) drug therapy: Secondary | ICD-10-CM | POA: Diagnosis not present

## 2022-10-30 DIAGNOSIS — Z8379 Family history of other diseases of the digestive system: Secondary | ICD-10-CM | POA: Diagnosis not present

## 2022-10-30 DIAGNOSIS — F17210 Nicotine dependence, cigarettes, uncomplicated: Secondary | ICD-10-CM | POA: Diagnosis not present

## 2022-10-30 DIAGNOSIS — Z885 Allergy status to narcotic agent status: Secondary | ICD-10-CM | POA: Diagnosis not present

## 2022-10-30 DIAGNOSIS — Z841 Family history of disorders of kidney and ureter: Secondary | ICD-10-CM | POA: Diagnosis not present

## 2022-10-30 DIAGNOSIS — Z882 Allergy status to sulfonamides status: Secondary | ICD-10-CM | POA: Diagnosis not present

## 2022-10-30 DIAGNOSIS — Z79631 Long term (current) use of antimetabolite agent: Secondary | ICD-10-CM | POA: Diagnosis not present

## 2022-10-30 DIAGNOSIS — C674 Malignant neoplasm of posterior wall of bladder: Secondary | ICD-10-CM | POA: Diagnosis not present

## 2022-10-30 DIAGNOSIS — Z8249 Family history of ischemic heart disease and other diseases of the circulatory system: Secondary | ICD-10-CM | POA: Diagnosis not present

## 2022-10-30 DIAGNOSIS — R232 Flushing: Secondary | ICD-10-CM | POA: Diagnosis not present

## 2022-10-30 DIAGNOSIS — Z79632 Long term (current) use of antitumor antibiotic: Secondary | ICD-10-CM | POA: Diagnosis not present

## 2022-10-30 DIAGNOSIS — I129 Hypertensive chronic kidney disease with stage 1 through stage 4 chronic kidney disease, or unspecified chronic kidney disease: Secondary | ICD-10-CM | POA: Diagnosis not present

## 2022-10-30 DIAGNOSIS — Z880 Allergy status to penicillin: Secondary | ICD-10-CM | POA: Diagnosis not present

## 2022-10-30 DIAGNOSIS — Z5111 Encounter for antineoplastic chemotherapy: Secondary | ICD-10-CM | POA: Diagnosis not present

## 2022-10-30 DIAGNOSIS — R310 Gross hematuria: Secondary | ICD-10-CM | POA: Diagnosis not present

## 2022-10-30 MED ORDER — SODIUM CHLORIDE 0.9 % IV SOLN: Freq: Once | INTRAVENOUS | Status: AC

## 2022-10-30 MED ORDER — MEGESTROL ACETATE 400 MG/10ML PO SUSP: 400.0000 mg | Freq: Two times a day (BID) | ORAL | 2 refills | Status: DC

## 2022-10-30 MED ORDER — MITOMYCIN CHEMO IV INJECTION 20 MG
12.0000 mg/m2 | Freq: Once | INTRAVENOUS | Status: AC
  Administered 2022-10-30: 15 mg via INTRAVENOUS
  Filled 2022-10-30: qty 30

## 2022-10-30 MED ORDER — SODIUM CHLORIDE 0.9 % IV SOLN
500.0000 mg/m2/d | INTRAVENOUS | Status: DC
  Administered 2022-10-30: 3500 mg via INTRAVENOUS
  Filled 2022-10-30: qty 70

## 2022-10-30 MED ORDER — PROCHLORPERAZINE MALEATE 10 MG PO TABS
10.0000 mg | ORAL_TABLET | Freq: Once | ORAL | Status: AC
  Administered 2022-10-30: 10 mg via ORAL
  Filled 2022-10-30: qty 1

## 2022-10-30 NOTE — Patient Instructions (Signed)
[facility] [place]  Discharge Instructions: Thank you for choosing [facility] to provide your oncology and hematology care.  If you have a lab appointment with the Cancer Center - please note that after April 8th, 2024, all labs will be drawn in the cancer center.  You do not have to check in or register with the main entrance as you have in the past but will complete your check-in in the cancer center.  Wear comfortable clothing and clothing appropriate for easy access to any Portacath or PICC line.   We strive to give you quality time with your provider. You may need to reschedule your appointment if you arrive late (15 or more minutes).  Arriving late affects you and other patients whose appointments are after yours.  Also, if you miss three or more appointments without notifying the office, you may be dismissed from the clinic at the provider's discretion.      For prescription refill requests, have your pharmacy contact our office and allow 72 hours for refills to be completed.    Today you received the following chemotherapy and/or immunotherapy agents Mitomycin/5FU.  Mitomycin Injection What is this medication? MITOMYCIN (mye toe MYE sin) treats stomach cancer and pancreatic cancer. It works by slowing down the growth of cancer cells. This medicine may be used for other purposes; ask your health care provider or pharmacist if you have questions. COMMON BRAND NAME(S): Mutamycin What should I tell my care team before I take this medication? They need to know if you have any of these conditions: Bleeding disorders Infection, such as chickenpox, cold sores, herpes Low blood counts, such as low white cells, platelets, red blood cells Kidney disease An unusual or allergic reaction to mitomycin, other medications, foods, dyes, or preservatives Pregnant or trying to get pregnant Breastfeeding How should I use this medication? This medication is injected into a  vein. It is given by your care team in a hospital or clinic setting. Talk to your care team about the use of this medication in children. Special care may be needed. Overdosage: If you think you have taken too much of this medicine contact a poison control center or emergency room at once. NOTE: This medicine is only for you. Do not share this medicine with others. What if I miss a dose? Keep appointments for follow-up doses. It is important not to miss your dose. Call your care team if you are unable to keep an appointment. What may interact with this medication? Interactions are not expected. This list may not describe all possible interactions. Give your health care provider a list of all the medicines, herbs, non-prescription drugs, or dietary supplements you use. Also tell them if you smoke, drink alcohol, or use illegal drugs. Some items may interact with your medicine. What should I watch for while using this medication? Your condition will be monitored carefully while you are receiving this medication. You may need blood work while taking this medication. This medication may make you feel generally unwell. This is not uncommon as chemotherapy can affect healthy cells as well as cancer cells. Report any side effects. Continue your course of treatment even though you feel ill unless your care team tells you to stop. This medication may increase your risk of getting an infection. Call your care team for advice if you get a fever, chills, sore throat, or other symptoms of a cold or flu. Do not treat yourself. Try to avoid being around people who  are sick. Avoid taking medications that contain aspirin, acetaminophen, ibuprofen, naproxen, or ketoprofen unless instructed by your care team. These medications may hide a fever. This medication may increase your risk to bruise or bleed. Call your care team if you notice any unusual bleeding. Be careful brushing or flossing your teeth or using a toothpick  because you may get an infection or bleed more easily. If you have any dental work done, tell your dentist you are receiving this medication. Talk to your care team if you may be pregnant. Serious birth defects can occur if you take this medication during pregnancy. Contraception is recommended while taking this medication. Your care team can help you find the option that works for you. Do not breastfeed while taking this medication. What side effects may I notice from receiving this medication? Side effects that you should report to your care team as soon as possible: Allergic reactions--skin rash, itching, hives, swelling of the face, lips, tongue, or throat Dry cough, shortness of breath or trouble breathing Infection--fever, chills, cough, sore throat, wounds that don't heal, pain or trouble when passing urine, general feeling of discomfort or being unwell Kidney injury--decrease in the amount of urine, swelling of the ankles, hands, or feet Low red blood cell level--unusual weakness or fatigue, dizziness, headache, trouble breathing Stomach pain, bloody diarrhea, pale skin, unusual weakness or fatigue, decrease in the amount of urine, which may be signs of hemolytic uremic syndrome Unusual bruising or bleeding Side effects that usually do not require medical attention (report these to your care team if they continue or are bothersome): Diarrhea Hair loss Loss of appetite with weight loss Nausea Pain, redness, or swelling with sores inside the mouth or throat This list may not describe all possible side effects. Call your doctor for medical advice about side effects. You may report side effects to FDA at 1-800-FDA-1088. Where should I keep my medication? This medication is given in a hospital or clinic. It will not be stored at home. NOTE: This sheet is a summary. It may not cover all possible information. If you have questions about this medicine, talk to your doctor, pharmacist, or health  care provider.  2023 Elsevier/Gold Standard (2021-11-14 00:00:00)    Fluorouracil Injection What is this medication? FLUOROURACIL (flure oh YOOR a sil) treats some types of cancer. It works by slowing down the growth of cancer cells. This medicine may be used for other purposes; ask your health care provider or pharmacist if you have questions. COMMON BRAND NAME(S): Adrucil What should I tell my care team before I take this medication? They need to know if you have any of these conditions: Blood disorders Dihydropyrimidine dehydrogenase (DPD) deficiency Infection, such as chickenpox, cold sores, herpes Kidney disease Liver disease Poor nutrition Recent or ongoing radiation therapy An unusual or allergic reaction to fluorouracil, other medications, foods, dyes, or preservatives If you or your partner are pregnant or trying to get pregnant Breast-feeding How should I use this medication? This medication is injected into a vein. It is administered by your care team in a hospital or clinic setting. Talk to your care team about the use of this medication in children. Special care may be needed. Overdosage: If you think you have taken too much of this medicine contact a poison control center or emergency room at once. NOTE: This medicine is only for you. Do not share this medicine with others. What if I miss a dose? Keep appointments for follow-up doses. It is important not  to miss your dose. Call your care team if you are unable to keep an appointment. What may interact with this medication? Do not take this medication with any of the following: Live virus vaccines This medication may also interact with the following: Medications that treat or prevent blood clots, such as warfarin, enoxaparin, dalteparin This list may not describe all possible interactions. Give your health care provider a list of all the medicines, herbs, non-prescription drugs, or dietary supplements you use. Also  tell them if you smoke, drink alcohol, or use illegal drugs. Some items may interact with your medicine. What should I watch for while using this medication? Your condition will be monitored carefully while you are receiving this medication. This medication may make you feel generally unwell. This is not uncommon as chemotherapy can affect healthy cells as well as cancer cells. Report any side effects. Continue your course of treatment even though you feel ill unless your care team tells you to stop. In some cases, you may be given additional medications to help with side effects. Follow all directions for their use. This medication may increase your risk of getting an infection. Call your care team for advice if you get a fever, chills, sore throat, or other symptoms of a cold or flu. Do not treat yourself. Try to avoid being around people who are sick. This medication may increase your risk to bruise or bleed. Call your care team if you notice any unusual bleeding. Be careful brushing or flossing your teeth or using a toothpick because you may get an infection or bleed more easily. If you have any dental work done, tell your dentist you are receiving this medication. Avoid taking medications that contain aspirin, acetaminophen, ibuprofen, naproxen, or ketoprofen unless instructed by your care team. These medications may hide a fever. Do not treat diarrhea with over the counter products. Contact your care team if you have diarrhea that lasts more than 2 days or if it is severe and watery. This medication can make you more sensitive to the sun. Keep out of the sun. If you cannot avoid being in the sun, wear protective clothing and sunscreen. Do not use sun lamps, tanning beds, or tanning booths. Talk to your care team if you or your partner wish to become pregnant or think you might be pregnant. This medication can cause serious birth defects if taken during pregnancy and for 3 months after the last dose.  A reliable form of contraception is recommended while taking this medication and for 3 months after the last dose. Talk to your care team about effective forms of contraception. Do not father a child while taking this medication and for 3 months after the last dose. Use a condom while having sex during this time period. Do not breastfeed while taking this medication. This medication may cause infertility. Talk to your care team if you are concerned about your fertility. What side effects may I notice from receiving this medication? Side effects that you should report to your care team as soon as possible: Allergic reactions--skin rash, itching, hives, swelling of the face, lips, tongue, or throat Heart attack--pain or tightness in the chest, shoulders, arms, or jaw, nausea, shortness of breath, cold or clammy skin, feeling faint or lightheaded Heart failure--shortness of breath, swelling of the ankles, feet, or hands, sudden weight gain, unusual weakness or fatigue Heart rhythm changes--fast or irregular heartbeat, dizziness, feeling faint or lightheaded, chest pain, trouble breathing High ammonia level--unusual weakness or fatigue, confusion,  loss of appetite, nausea, vomiting, seizures Infection--fever, chills, cough, sore throat, wounds that don't heal, pain or trouble when passing urine, general feeling of discomfort or being unwell Low red blood cell level--unusual weakness or fatigue, dizziness, headache, trouble breathing Pain, tingling, or numbness in the hands or feet, muscle weakness, change in vision, confusion or trouble speaking, loss of balance or coordination, trouble walking, seizures Redness, swelling, and blistering of the skin over hands and feet Severe or prolonged diarrhea Unusual bruising or bleeding Side effects that usually do not require medical attention (report to your care team if they continue or are bothersome): Dry skin Headache Increased tears Nausea Pain,  redness, or swelling with sores inside the mouth or throat Sensitivity to light Vomiting This list may not describe all possible side effects. Call your doctor for medical advice about side effects. You may report side effects to FDA at 1-800-FDA-1088. Where should I keep my medication? This medication is given in a hospital or clinic. It will not be stored at home. NOTE: This sheet is a summary. It may not cover all possible information. If you have questions about this medicine, talk to your doctor, pharmacist, or health care provider.  2023 Elsevier/Gold Standard (2021-10-23 00:00:00)        To help prevent nausea and vomiting after your treatment, we encourage you to take your nausea medication as directed.  BELOW ARE SYMPTOMS THAT SHOULD BE REPORTED IMMEDIATELY: *FEVER GREATER THAN 100.4 F (38 C) OR HIGHER *CHILLS OR SWEATING *NAUSEA AND VOMITING THAT IS NOT CONTROLLED WITH YOUR NAUSEA MEDICATION *UNUSUAL SHORTNESS OF BREATH *UNUSUAL BRUISING OR BLEEDING *URINARY PROBLEMS (pain or burning when urinating, or frequent urination) *BOWEL PROBLEMS (unusual diarrhea, constipation, pain near the anus) TENDERNESS IN MOUTH AND THROAT WITH OR WITHOUT PRESENCE OF ULCERS (sore throat, sores in mouth, or a toothache) UNUSUAL RASH, SWELLING OR PAIN  UNUSUAL VAGINAL DISCHARGE OR ITCHING   Items with * indicate a potential emergency and should be followed up as soon as possible or go to the Emergency Department if any problems should occur.  Please show the CHEMOTHERAPY ALERT CARD or IMMUNOTHERAPY ALERT CARD at check-in to the Emergency Department and triage nurse.  Should you have questions after your visit or need to cancel or reschedule your appointment, please contact [facility]ENTER AT [facility] [phone]  and follow the prompts.  Office hours are 8:00 a.m. to 4:30 p.m. Monday - Friday. Please note that voicemails left after 4:00 p.m. may not be returned until the following business  day.  We are closed weekends and major holidays. You have access to a nurse at all times for urgent questions. Please call the main number to the clinic [phone] and follow the prompts.  For any non-urgent questions, you may also contact your provider using MyChart. We now offer e-Visits for anyone 52 and older to request care online for non-urgent symptoms. For details visit [email or web address].   Also download the MyChart app! Go to the app store, search "MyChart", open the app, select [place], and log in with your MyChart username and password.

## 2022-10-30 NOTE — Progress Notes (Signed)
Patient presents today for chemotherapy infusion. Patient is in satisfactory condition with no new complaints voiced.  Vital signs are stable.  Labs reviewed by Dr. Ellin Saba during the office visit and all labs are within treatment parameters.  Ok to use labs from 10/22/22 per Dr. Ellin Saba.  We will proceed with treatment per MD orders.   Patient tolerated treatment well with no complaints voiced.  Patient educated on home infusion pump and verbalized understanding.  Home infusion 5FU pump connected with no issues.  Patient left ambulatory with son in stable condition.  Vital signs stable at discharge.  Follow up as scheduled.

## 2022-10-30 NOTE — Progress Notes (Signed)
Pharmacist Chemotherapy Monitoring - Initial Assessment    Anticipated start date: 10/30/22   The following has been reviewed per standard work regarding the patient's treatment regimen: The patient's diagnosis, treatment plan and drug doses, and organ/hematologic function Lab orders and baseline tests specific to treatment regimen  The treatment plan start date, drug sequencing, and pre-medications Prior authorization status  Patient's documented medication list, including drug-drug interaction screen and prescriptions for anti-emetics and supportive care specific to the treatment regimen The drug concentrations, fluid compatibility, administration routes, and timing of the medications to be used The patient's access for treatment and lifetime cumulative dose history, if applicable  The patient's medication allergies and previous infusion related reactions, if applicable   Changes made to treatment plan:  N/A  Follow up needed:  N/A   Stephens Shire, [facility], 10/30/2022  9:43 AM

## 2022-10-30 NOTE — Patient Instructions (Signed)
[facility] at [facility] Discharge Instructions   You were seen and examined today by Dr. Katragadda.  He reviewed the results of your lab work which are normal/stable.   We will proceed with your treatment today.  Return as scheduled.    Thank you for choosing [facility] at [facility] to provide your oncology and hematology care.  To afford each patient quality time with our provider, please arrive at least 15 minutes before your scheduled appointment time.   If you have a lab appointment with the Cancer Center please come in thru the Main Entrance and check in at the main information desk.  You need to re-schedule your appointment should you arrive 10 or more minutes late.  We strive to give you quality time with our providers, and arriving late affects you and other patients whose appointments are after yours.  Also, if you no show three or more times for appointments you may be dismissed from the clinic at the providers discretion.     Again, thank you for choosing [facility].  Our hope is that these requests will decrease the amount of time that you wait before being seen by our physicians.       _____________________________________________________________  Should you have questions after your visit to [facility], please contact our office at [phone] and follow the prompts.  Our office hours are 8:00 a.m. and 4:30 p.m. Monday - Friday.  Please note that voicemails left after 4:00 p.m. may not be returned until the following business day.  We are closed weekends and major holidays.  You do have access to a nurse 24-7, just call the main number to the clinic [phone] and do not press any options, hold on the line and a nurse will answer the phone.    For prescription refill requests, have your pharmacy contact our office and allow 72 hours.    Due to Covid, you will need to wear a mask upon entering  the hospital. If you do not have a mask, a mask will be given to you at the Main Entrance upon arrival. For doctor visits, patients may have 1 support person age 18 or older with them. For treatment visits, patients can not have anyone with them due to social distancing guidelines and our immunocompromised population.      

## 2022-10-31 ENCOUNTER — Inpatient Hospital Stay: Payer: Medicare Other

## 2022-10-31 ENCOUNTER — Other Ambulatory Visit: Payer: Self-pay | Admitting: *Deleted

## 2022-10-31 VITALS — BP 132/69 | HR 69 | Temp 97.2°F | Resp 18

## 2022-10-31 DIAGNOSIS — C679 Malignant neoplasm of bladder, unspecified: Secondary | ICD-10-CM

## 2022-10-31 DIAGNOSIS — R634 Abnormal weight loss: Secondary | ICD-10-CM

## 2022-10-31 DIAGNOSIS — Z79631 Long term (current) use of antimetabolite agent: Secondary | ICD-10-CM | POA: Diagnosis not present

## 2022-10-31 DIAGNOSIS — Z885 Allergy status to narcotic agent status: Secondary | ICD-10-CM | POA: Diagnosis not present

## 2022-10-31 DIAGNOSIS — Z79632 Long term (current) use of antitumor antibiotic: Secondary | ICD-10-CM | POA: Diagnosis not present

## 2022-10-31 DIAGNOSIS — R310 Gross hematuria: Secondary | ICD-10-CM | POA: Diagnosis not present

## 2022-10-31 DIAGNOSIS — Z8349 Family history of other endocrine, nutritional and metabolic diseases: Secondary | ICD-10-CM | POA: Diagnosis not present

## 2022-10-31 DIAGNOSIS — Z79899 Other long term (current) drug therapy: Secondary | ICD-10-CM | POA: Diagnosis not present

## 2022-10-31 DIAGNOSIS — Z8249 Family history of ischemic heart disease and other diseases of the circulatory system: Secondary | ICD-10-CM | POA: Diagnosis not present

## 2022-10-31 DIAGNOSIS — Z880 Allergy status to penicillin: Secondary | ICD-10-CM | POA: Diagnosis not present

## 2022-10-31 DIAGNOSIS — Z882 Allergy status to sulfonamides status: Secondary | ICD-10-CM | POA: Diagnosis not present

## 2022-10-31 DIAGNOSIS — Z841 Family history of disorders of kidney and ureter: Secondary | ICD-10-CM | POA: Diagnosis not present

## 2022-10-31 DIAGNOSIS — N182 Chronic kidney disease, stage 2 (mild): Secondary | ICD-10-CM | POA: Diagnosis not present

## 2022-10-31 DIAGNOSIS — Z8379 Family history of other diseases of the digestive system: Secondary | ICD-10-CM | POA: Diagnosis not present

## 2022-10-31 DIAGNOSIS — C674 Malignant neoplasm of posterior wall of bladder: Secondary | ICD-10-CM | POA: Diagnosis not present

## 2022-10-31 DIAGNOSIS — R232 Flushing: Secondary | ICD-10-CM | POA: Diagnosis not present

## 2022-10-31 DIAGNOSIS — F17210 Nicotine dependence, cigarettes, uncomplicated: Secondary | ICD-10-CM | POA: Diagnosis not present

## 2022-10-31 DIAGNOSIS — I129 Hypertensive chronic kidney disease with stage 1 through stage 4 chronic kidney disease, or unspecified chronic kidney disease: Secondary | ICD-10-CM | POA: Diagnosis not present

## 2022-10-31 DIAGNOSIS — Z5111 Encounter for antineoplastic chemotherapy: Secondary | ICD-10-CM | POA: Diagnosis not present

## 2022-10-31 DIAGNOSIS — Z886 Allergy status to analgesic agent status: Secondary | ICD-10-CM | POA: Diagnosis not present

## 2022-10-31 MED ORDER — SODIUM CHLORIDE 0.9 % IV SOLN
3250.0000 mg | INTRAVENOUS | Status: DC
  Administered 2022-10-31: 3250 mg via INTRAVENOUS
  Filled 2022-10-31: qty 65

## 2022-10-31 NOTE — Patient Instructions (Signed)
[facility]  Discharge Instructions: Thank you for choosing [facility] to provide your oncology and hematology care.  If you have a lab appointment with the Cancer Center - please note that after April 8th, 2024, all labs will be drawn in the cancer center.  You do not have to check in or register with the main entrance as you have in the past but will complete your check-in in the cancer center.  Wear comfortable clothing and clothing appropriate for easy access to any Portacath or PICC line.   We strive to give you quality time with your provider. You may need to reschedule your appointment if you arrive late (15 or more minutes).  Arriving late affects you and other patients whose appointments are after yours.  Also, if you miss three or more appointments without notifying the office, you may be dismissed from the clinic at the provider's discretion.      For prescription refill requests, have your pharmacy contact our office and allow 72 hours for refills to be completed.    Patient reminded of after hours number to call for any problems with understanding verbalized.      To help prevent nausea and vomiting after your treatment, we encourage you to take your nausea medication as directed.  BELOW ARE SYMPTOMS THAT SHOULD BE REPORTED IMMEDIATELY: *FEVER GREATER THAN 100.4 F (38 C) OR HIGHER *CHILLS OR SWEATING *NAUSEA AND VOMITING THAT IS NOT CONTROLLED WITH YOUR NAUSEA MEDICATION *UNUSUAL SHORTNESS OF BREATH *UNUSUAL BRUISING OR BLEEDING *URINARY PROBLEMS (pain or burning when urinating, or frequent urination) *BOWEL PROBLEMS (unusual diarrhea, constipation, pain near the anus) TENDERNESS IN MOUTH AND THROAT WITH OR WITHOUT PRESENCE OF ULCERS (sore throat, sores in mouth, or a toothache) UNUSUAL RASH, SWELLING OR PAIN  UNUSUAL VAGINAL DISCHARGE OR ITCHING   Items with * indicate a potential emergency and should be followed up as soon as possible  or go to the Emergency Department if any problems should occur.  Please show the CHEMOTHERAPY ALERT CARD or IMMUNOTHERAPY ALERT CARD at check-in to the Emergency Department and triage nurse.  Should you have questions after your visit or need to cancel or reschedule your appointment, please contact [facility] AT [facility] [phone]  and follow the prompts.  Office hours are 8:00 a.m. to 4:30 p.m. Monday - Friday. Please note that voicemails left after 4:00 p.m. may not be returned until the following business day.  We are closed weekends and major holidays. You have access to a nurse at all times for urgent questions. Please call the main number to the clinic [phone] and follow the prompts.  For any non-urgent questions, you may also contact your provider using MyChart. We now offer e-Visits for anyone 31 and older to request care online for non-urgent symptoms. For details visit [email or web address].   Also download the MyChart app! Go to the app store, search "MyChart", open the app, select [place], and log in with your MyChart username and password.

## 2022-10-31 NOTE — Progress Notes (Signed)
0930-Patient's family called stating her cat chewed the chemotherapy pump tubing yesterday before bedtime.  Patient stated her son turned off the pump and clamped the tubing along with the clamps on the port needle tubing.  Patient instructed to report to the cancer center for assessment and her provider will be made aware of problem.    0937-Dr. Ellin Saba aware of patients chemotherapy pump tubing.  Pharmacy made aware, too.    1026- patient to treatment room.  Port site clean and dry with no bruising or swelling noted.  Port flushed easily with good blood return noted.  Pump showed 11 ml infused.  See flowsheet for documentation.  Pharmacy aware.   Patients new chemotherapy bag started.   Side effects with management reviewed with understanding verbalized.  Port site clean and dry with no bruising or swelling noted at site.  Good blood return noted before restarting new chemotherapy bag.  Dressing dry and intact.  Patient reminded to call the after hours number for any problems with understanding verbalized.   Patient left in satisfactory condition with VSS and no s/s of distress noted.

## 2022-10-31 NOTE — Progress Notes (Signed)
Patient had issue with pump at home last night - pump shows 11 ml (~250 mg) has infused.  Remainder of dose ~3250 mg will be remade and infuse at current rate of 1.25 ml/hr.  Pryor Ochoa, PharmD

## 2022-11-01 DIAGNOSIS — C679 Malignant neoplasm of bladder, unspecified: Secondary | ICD-10-CM | POA: Diagnosis not present

## 2022-11-01 DIAGNOSIS — C674 Malignant neoplasm of posterior wall of bladder: Secondary | ICD-10-CM | POA: Diagnosis not present

## 2022-11-02 DIAGNOSIS — M419 Scoliosis, unspecified: Secondary | ICD-10-CM | POA: Diagnosis not present

## 2022-11-02 DIAGNOSIS — C679 Malignant neoplasm of bladder, unspecified: Secondary | ICD-10-CM | POA: Diagnosis not present

## 2022-11-02 DIAGNOSIS — I10 Essential (primary) hypertension: Secondary | ICD-10-CM | POA: Diagnosis not present

## 2022-11-02 DIAGNOSIS — F17210 Nicotine dependence, cigarettes, uncomplicated: Secondary | ICD-10-CM | POA: Diagnosis not present

## 2022-11-02 DIAGNOSIS — Z452 Encounter for adjustment and management of vascular access device: Secondary | ICD-10-CM | POA: Diagnosis not present

## 2022-11-02 DIAGNOSIS — J4489 Other specified chronic obstructive pulmonary disease: Secondary | ICD-10-CM | POA: Diagnosis not present

## 2022-11-02 DIAGNOSIS — M519 Unspecified thoracic, thoracolumbar and lumbosacral intervertebral disc disorder: Secondary | ICD-10-CM | POA: Diagnosis not present

## 2022-11-04 ENCOUNTER — Encounter: Payer: Self-pay | Admitting: *Deleted

## 2022-11-04 ENCOUNTER — Inpatient Hospital Stay: Payer: Medicare Other

## 2022-11-04 DIAGNOSIS — R232 Flushing: Secondary | ICD-10-CM | POA: Diagnosis not present

## 2022-11-04 DIAGNOSIS — N182 Chronic kidney disease, stage 2 (mild): Secondary | ICD-10-CM | POA: Diagnosis not present

## 2022-11-04 DIAGNOSIS — Z5111 Encounter for antineoplastic chemotherapy: Secondary | ICD-10-CM | POA: Diagnosis not present

## 2022-11-04 DIAGNOSIS — Z79899 Other long term (current) drug therapy: Secondary | ICD-10-CM | POA: Diagnosis not present

## 2022-11-04 DIAGNOSIS — C679 Malignant neoplasm of bladder, unspecified: Secondary | ICD-10-CM

## 2022-11-04 DIAGNOSIS — Z79632 Long term (current) use of antitumor antibiotic: Secondary | ICD-10-CM | POA: Diagnosis not present

## 2022-11-04 DIAGNOSIS — Z79631 Long term (current) use of antimetabolite agent: Secondary | ICD-10-CM | POA: Diagnosis not present

## 2022-11-04 DIAGNOSIS — Z880 Allergy status to penicillin: Secondary | ICD-10-CM | POA: Diagnosis not present

## 2022-11-04 DIAGNOSIS — Z8349 Family history of other endocrine, nutritional and metabolic diseases: Secondary | ICD-10-CM | POA: Diagnosis not present

## 2022-11-04 DIAGNOSIS — Z8379 Family history of other diseases of the digestive system: Secondary | ICD-10-CM | POA: Diagnosis not present

## 2022-11-04 DIAGNOSIS — Z882 Allergy status to sulfonamides status: Secondary | ICD-10-CM | POA: Diagnosis not present

## 2022-11-04 DIAGNOSIS — Z885 Allergy status to narcotic agent status: Secondary | ICD-10-CM | POA: Diagnosis not present

## 2022-11-04 DIAGNOSIS — C674 Malignant neoplasm of posterior wall of bladder: Secondary | ICD-10-CM | POA: Diagnosis not present

## 2022-11-04 DIAGNOSIS — Z8249 Family history of ischemic heart disease and other diseases of the circulatory system: Secondary | ICD-10-CM | POA: Diagnosis not present

## 2022-11-04 DIAGNOSIS — Z886 Allergy status to analgesic agent status: Secondary | ICD-10-CM | POA: Diagnosis not present

## 2022-11-04 DIAGNOSIS — I129 Hypertensive chronic kidney disease with stage 1 through stage 4 chronic kidney disease, or unspecified chronic kidney disease: Secondary | ICD-10-CM | POA: Diagnosis not present

## 2022-11-04 DIAGNOSIS — R310 Gross hematuria: Secondary | ICD-10-CM | POA: Diagnosis not present

## 2022-11-04 DIAGNOSIS — Z841 Family history of disorders of kidney and ureter: Secondary | ICD-10-CM | POA: Diagnosis not present

## 2022-11-04 DIAGNOSIS — F17210 Nicotine dependence, cigarettes, uncomplicated: Secondary | ICD-10-CM | POA: Diagnosis not present

## 2022-11-04 MED ORDER — HEPARIN SOD (PORK) LOCK FLUSH 100 UNIT/ML IV SOLN
500.0000 [IU] | Freq: Once | INTRAVENOUS | Status: AC | PRN
  Administered 2022-11-04: 500 [IU]

## 2022-11-04 MED ORDER — SODIUM CHLORIDE 0.9% FLUSH
10.0000 mL | INTRAVENOUS | Status: DC | PRN
  Administered 2022-11-04: 10 mL

## 2022-11-04 NOTE — Patient Instructions (Signed)
[facility]  Discharge Instructions: Thank you for choosing [facility] to provide your oncology and hematology care.  If you have a lab appointment with the Cancer Center - please note that after April 8th, 2024, all labs will be drawn in the cancer center.  You do not have to check in or register with the main entrance as you have in the past but will complete your check-in in the cancer center.  Wear comfortable clothing and clothing appropriate for easy access to any Portacath or PICC line.   We strive to give you quality time with your provider. You may need to reschedule your appointment if you arrive late (15 or more minutes).  Arriving late affects you and other patients whose appointments are after yours.  Also, if you miss three or more appointments without notifying the office, you may be dismissed from the clinic at the provider's discretion.      For prescription refill requests, have your pharmacy contact our office and allow 72 hours for refills to be completed.  To help prevent nausea and vomiting after your treatment, we encourage you to take your nausea medication as directed.  BELOW ARE SYMPTOMS THAT SHOULD BE REPORTED IMMEDIATELY: *FEVER GREATER THAN 100.4 F (38 C) OR HIGHER *CHILLS OR SWEATING *NAUSEA AND VOMITING THAT IS NOT CONTROLLED WITH YOUR NAUSEA MEDICATION *UNUSUAL SHORTNESS OF BREATH *UNUSUAL BRUISING OR BLEEDING *URINARY PROBLEMS (pain or burning when urinating, or frequent urination) *BOWEL PROBLEMS (unusual diarrhea, constipation, pain near the anus) TENDERNESS IN MOUTH AND THROAT WITH OR WITHOUT PRESENCE OF ULCERS (sore throat, sores in mouth, or a toothache) UNUSUAL RASH, SWELLING OR PAIN  UNUSUAL VAGINAL DISCHARGE OR ITCHING   Items with * indicate a potential emergency and should be followed up as soon as possible or go to the Emergency Department if any problems should occur.  Please show the CHEMOTHERAPY ALERT CARD or  IMMUNOTHERAPY ALERT CARD at check-in to the Emergency Department and triage nurse.  Should you have questions after your visit or need to cancel or reschedule your appointment, please contact [facility] [phone]  and follow the prompts.  Office hours are 8:00 a.m. to 4:30 p.m. Monday - Friday. Please note that voicemails left after 4:00 p.m. may not be returned until the following business day.  We are closed weekends and major holidays. You have access to a nurse at all times for urgent questions. Please call the main number to the clinic [phone] and follow the prompts.  For any non-urgent questions, you may also contact your provider using MyChart. We now offer e-Visits for anyone 18 and older to request care online for non-urgent symptoms. For details visit [email or web address].   Also download the MyChart app! Go to the app store, search "MyChart", open the app, select [facility], and log in with your MyChart username and password.   

## 2022-11-04 NOTE — Progress Notes (Signed)
Patients port flushed without difficulty.  Good blood return noted with no bruising or swelling noted at site.  Band aid applied. Home infusion 5FU pump disconnected with no issues.  VSS with discharge and left in satisfactory condition with no s/s of distress noted.   

## 2022-11-05 ENCOUNTER — Telehealth: Payer: Self-pay

## 2022-11-05 DIAGNOSIS — C674 Malignant neoplasm of posterior wall of bladder: Secondary | ICD-10-CM | POA: Diagnosis not present

## 2022-11-05 DIAGNOSIS — C679 Malignant neoplasm of bladder, unspecified: Secondary | ICD-10-CM | POA: Diagnosis not present

## 2022-11-05 NOTE — Telephone Encounter (Signed)
24 hour follow up call, patient states she is doing good today, was tired yesterday. She stated she had went to Radiation and has eaten without issues today. No other concerns or questions.

## 2022-11-06 DIAGNOSIS — C670 Malignant neoplasm of trigone of bladder: Secondary | ICD-10-CM | POA: Diagnosis not present

## 2022-11-06 DIAGNOSIS — L539 Erythematous condition, unspecified: Secondary | ICD-10-CM | POA: Diagnosis not present

## 2022-11-06 DIAGNOSIS — Z510 Encounter for antineoplastic radiation therapy: Secondary | ICD-10-CM | POA: Diagnosis not present

## 2022-11-06 DIAGNOSIS — C679 Malignant neoplasm of bladder, unspecified: Secondary | ICD-10-CM | POA: Diagnosis not present

## 2022-11-06 DIAGNOSIS — R197 Diarrhea, unspecified: Secondary | ICD-10-CM | POA: Diagnosis not present

## 2022-11-07 ENCOUNTER — Inpatient Hospital Stay: Payer: Medicare Other | Admitting: Dietician

## 2022-11-07 ENCOUNTER — Ambulatory Visit: Payer: Medicare Other | Admitting: Physician Assistant

## 2022-11-07 ENCOUNTER — Other Ambulatory Visit: Payer: Medicare Other

## 2022-11-07 ENCOUNTER — Ambulatory Visit: Payer: Medicare Other

## 2022-11-07 ENCOUNTER — Telehealth: Payer: Self-pay | Admitting: Dietician

## 2022-11-07 DIAGNOSIS — L539 Erythematous condition, unspecified: Secondary | ICD-10-CM | POA: Diagnosis not present

## 2022-11-07 DIAGNOSIS — C679 Malignant neoplasm of bladder, unspecified: Secondary | ICD-10-CM | POA: Diagnosis not present

## 2022-11-07 DIAGNOSIS — Z510 Encounter for antineoplastic radiation therapy: Secondary | ICD-10-CM | POA: Diagnosis not present

## 2022-11-07 DIAGNOSIS — R197 Diarrhea, unspecified: Secondary | ICD-10-CM | POA: Diagnosis not present

## 2022-11-07 DIAGNOSIS — C670 Malignant neoplasm of trigone of bladder: Secondary | ICD-10-CM | POA: Diagnosis not present

## 2022-11-07 NOTE — Telephone Encounter (Signed)
Attempted to contact patient for nutrition follow-up as scheduled. Patient did not answer. Left VM with request for return call. Contact information given.

## 2022-11-08 DIAGNOSIS — L539 Erythematous condition, unspecified: Secondary | ICD-10-CM | POA: Diagnosis not present

## 2022-11-08 DIAGNOSIS — C679 Malignant neoplasm of bladder, unspecified: Secondary | ICD-10-CM | POA: Diagnosis not present

## 2022-11-08 DIAGNOSIS — C670 Malignant neoplasm of trigone of bladder: Secondary | ICD-10-CM | POA: Diagnosis not present

## 2022-11-08 DIAGNOSIS — R197 Diarrhea, unspecified: Secondary | ICD-10-CM | POA: Diagnosis not present

## 2022-11-08 DIAGNOSIS — Z510 Encounter for antineoplastic radiation therapy: Secondary | ICD-10-CM | POA: Diagnosis not present

## 2022-11-11 DIAGNOSIS — C679 Malignant neoplasm of bladder, unspecified: Secondary | ICD-10-CM | POA: Diagnosis not present

## 2022-11-11 DIAGNOSIS — Z510 Encounter for antineoplastic radiation therapy: Secondary | ICD-10-CM | POA: Diagnosis not present

## 2022-11-11 DIAGNOSIS — R197 Diarrhea, unspecified: Secondary | ICD-10-CM | POA: Diagnosis not present

## 2022-11-11 DIAGNOSIS — L539 Erythematous condition, unspecified: Secondary | ICD-10-CM | POA: Diagnosis not present

## 2022-11-11 DIAGNOSIS — C670 Malignant neoplasm of trigone of bladder: Secondary | ICD-10-CM | POA: Diagnosis not present

## 2022-11-12 DIAGNOSIS — C679 Malignant neoplasm of bladder, unspecified: Secondary | ICD-10-CM | POA: Diagnosis not present

## 2022-11-12 DIAGNOSIS — Z510 Encounter for antineoplastic radiation therapy: Secondary | ICD-10-CM | POA: Diagnosis not present

## 2022-11-12 DIAGNOSIS — R197 Diarrhea, unspecified: Secondary | ICD-10-CM | POA: Diagnosis not present

## 2022-11-12 DIAGNOSIS — L539 Erythematous condition, unspecified: Secondary | ICD-10-CM | POA: Diagnosis not present

## 2022-11-12 DIAGNOSIS — C670 Malignant neoplasm of trigone of bladder: Secondary | ICD-10-CM | POA: Diagnosis not present

## 2022-11-13 DIAGNOSIS — Z510 Encounter for antineoplastic radiation therapy: Secondary | ICD-10-CM | POA: Diagnosis not present

## 2022-11-13 DIAGNOSIS — C670 Malignant neoplasm of trigone of bladder: Secondary | ICD-10-CM | POA: Diagnosis not present

## 2022-11-13 DIAGNOSIS — C679 Malignant neoplasm of bladder, unspecified: Secondary | ICD-10-CM | POA: Diagnosis not present

## 2022-11-13 DIAGNOSIS — L539 Erythematous condition, unspecified: Secondary | ICD-10-CM | POA: Diagnosis not present

## 2022-11-13 DIAGNOSIS — R197 Diarrhea, unspecified: Secondary | ICD-10-CM | POA: Diagnosis not present

## 2022-11-14 ENCOUNTER — Other Ambulatory Visit: Payer: Self-pay | Admitting: *Deleted

## 2022-11-14 ENCOUNTER — Inpatient Hospital Stay: Payer: Medicare Other

## 2022-11-14 ENCOUNTER — Inpatient Hospital Stay: Payer: Medicare Other | Attending: Hematology

## 2022-11-14 ENCOUNTER — Inpatient Hospital Stay (HOSPITAL_BASED_OUTPATIENT_CLINIC_OR_DEPARTMENT_OTHER): Payer: Medicare Other | Admitting: Physician Assistant

## 2022-11-14 VITALS — BP 133/70 | HR 78 | Temp 98.4°F | Resp 18 | Wt 85.8 lb

## 2022-11-14 DIAGNOSIS — Z808 Family history of malignant neoplasm of other organs or systems: Secondary | ICD-10-CM | POA: Diagnosis not present

## 2022-11-14 DIAGNOSIS — C679 Malignant neoplasm of bladder, unspecified: Secondary | ICD-10-CM

## 2022-11-14 DIAGNOSIS — Z8349 Family history of other endocrine, nutritional and metabolic diseases: Secondary | ICD-10-CM | POA: Insufficient documentation

## 2022-11-14 DIAGNOSIS — Z510 Encounter for antineoplastic radiation therapy: Secondary | ICD-10-CM | POA: Diagnosis not present

## 2022-11-14 DIAGNOSIS — Z841 Family history of disorders of kidney and ureter: Secondary | ICD-10-CM | POA: Insufficient documentation

## 2022-11-14 DIAGNOSIS — Z79899 Other long term (current) drug therapy: Secondary | ICD-10-CM | POA: Insufficient documentation

## 2022-11-14 DIAGNOSIS — R5383 Other fatigue: Secondary | ICD-10-CM | POA: Diagnosis not present

## 2022-11-14 DIAGNOSIS — Z95828 Presence of other vascular implants and grafts: Secondary | ICD-10-CM

## 2022-11-14 DIAGNOSIS — Z885 Allergy status to narcotic agent status: Secondary | ICD-10-CM | POA: Insufficient documentation

## 2022-11-14 DIAGNOSIS — F17210 Nicotine dependence, cigarettes, uncomplicated: Secondary | ICD-10-CM | POA: Insufficient documentation

## 2022-11-14 DIAGNOSIS — Z8041 Family history of malignant neoplasm of ovary: Secondary | ICD-10-CM | POA: Insufficient documentation

## 2022-11-14 DIAGNOSIS — Z8249 Family history of ischemic heart disease and other diseases of the circulatory system: Secondary | ICD-10-CM | POA: Diagnosis not present

## 2022-11-14 DIAGNOSIS — Z882 Allergy status to sulfonamides status: Secondary | ICD-10-CM | POA: Insufficient documentation

## 2022-11-14 DIAGNOSIS — Z886 Allergy status to analgesic agent status: Secondary | ICD-10-CM | POA: Insufficient documentation

## 2022-11-14 DIAGNOSIS — Z8379 Family history of other diseases of the digestive system: Secondary | ICD-10-CM | POA: Diagnosis not present

## 2022-11-14 DIAGNOSIS — Z818 Family history of other mental and behavioral disorders: Secondary | ICD-10-CM | POA: Diagnosis not present

## 2022-11-14 DIAGNOSIS — Z923 Personal history of irradiation: Secondary | ICD-10-CM | POA: Insufficient documentation

## 2022-11-14 DIAGNOSIS — R630 Anorexia: Secondary | ICD-10-CM | POA: Diagnosis not present

## 2022-11-14 DIAGNOSIS — R197 Diarrhea, unspecified: Secondary | ICD-10-CM | POA: Diagnosis not present

## 2022-11-14 DIAGNOSIS — C670 Malignant neoplasm of trigone of bladder: Secondary | ICD-10-CM | POA: Diagnosis not present

## 2022-11-14 DIAGNOSIS — L539 Erythematous condition, unspecified: Secondary | ICD-10-CM | POA: Diagnosis not present

## 2022-11-14 DIAGNOSIS — Z5111 Encounter for antineoplastic chemotherapy: Secondary | ICD-10-CM | POA: Diagnosis present

## 2022-11-14 DIAGNOSIS — Z452 Encounter for adjustment and management of vascular access device: Secondary | ICD-10-CM | POA: Diagnosis not present

## 2022-11-14 LAB — COMPREHENSIVE METABOLIC PANEL
ALT: 9 U/L (ref 0–44)
AST: 15 U/L (ref 15–41)
Albumin: 3.3 g/dL — ABNORMAL LOW (ref 3.5–5.0)
Alkaline Phosphatase: 51 U/L (ref 38–126)
Anion gap: 7 (ref 5–15)
BUN: 16 mg/dL (ref 8–23)
CO2: 23 mmol/L (ref 22–32)
Calcium: 8.7 mg/dL — ABNORMAL LOW (ref 8.9–10.3)
Chloride: 104 mmol/L (ref 98–111)
Creatinine, Ser: 0.95 mg/dL (ref 0.44–1.00)
GFR, Estimated: >60 mL/min (ref >60)
Glucose, Bld: 111 mg/dL — ABNORMAL HIGH (ref 70–99)
Potassium: 3.6 mmol/L (ref 3.5–5.1)
Sodium: 134 mmol/L — ABNORMAL LOW (ref 135–145)
Total Bilirubin: 0.3 mg/dL (ref 0.3–1.2)
Total Protein: 6.5 g/dL (ref 6.5–8.1)

## 2022-11-14 LAB — MAGNESIUM: Magnesium: 2 mg/dL (ref 1.7–2.4)

## 2022-11-14 LAB — VITAMIN D 25 HYDROXY (VIT D DEFICIENCY, FRACTURES): Vit D, 25-Hydroxy: 31.03 ng/mL (ref 30–100)

## 2022-11-14 LAB — CBC WITH DIFFERENTIAL/PLATELET
Abs Immature Granulocytes: 0.01 10*3/uL (ref 0.00–0.07)
Basophils Absolute: 0 10*3/uL (ref 0.0–0.1)
Basophils Relative: 0 %
Eosinophils Absolute: 0.3 10*3/uL (ref 0.0–0.5)
Eosinophils Relative: 6 %
HCT: 32.4 % — ABNORMAL LOW (ref 36.0–46.0)
Hemoglobin: 10.9 g/dL — ABNORMAL LOW (ref 12.0–15.0)
Immature Granulocytes: 0 %
Lymphocytes Relative: 20 %
Lymphs Abs: 1 10*3/uL (ref 0.7–4.0)
MCH: 31.8 pg (ref 26.0–34.0)
MCHC: 33.6 g/dL (ref 30.0–36.0)
MCV: 94.5 fL (ref 80.0–100.0)
Monocytes Absolute: 0.6 10*3/uL (ref 0.1–1.0)
Monocytes Relative: 12 %
Neutro Abs: 2.9 10*3/uL (ref 1.7–7.7)
Neutrophils Relative %: 62 %
Platelets: 95 10*3/uL — ABNORMAL LOW (ref 150–400)
RBC: 3.43 MIL/uL — ABNORMAL LOW (ref 3.87–5.11)
RDW: 14.5 % (ref 11.5–15.5)
WBC: 4.7 10*3/uL (ref 4.0–10.5)
nRBC: 0 % (ref 0.0–0.2)

## 2022-11-14 MED ORDER — SODIUM CHLORIDE 0.9% FLUSH
10.0000 mL | Freq: Once | INTRAVENOUS | Status: AC
  Administered 2022-11-14: 10 mL via INTRAVENOUS

## 2022-11-14 MED ORDER — SODIUM CHLORIDE 0.9 % IV SOLN: INTRAVENOUS | Status: AC

## 2022-11-14 MED ORDER — HEPARIN SOD (PORK) LOCK FLUSH 100 UNIT/ML IV SOLN
500.0000 [IU] | Freq: Once | INTRAVENOUS | Status: AC
  Administered 2022-11-14: 500 [IU] via INTRAVENOUS

## 2022-11-14 NOTE — Progress Notes (Signed)
[name] [address], [place] [address]    Clinic Day:  11/14/2022  Referring physician: Elfredia Nevins, MD  Patient Care Team: Elfredia Nevins, MD as PCP - General (Internal Medicine) McKenzie, Mardene Celeste, MD as Consulting Physician (Urology) Langston Masker Estelle Grumbles, MD as Consulting Physician (Radiation Oncology) Doreatha Massed, MD as Consulting Physician (Hematology)  CHIEF COMPLAINT:   Diagnosis: muscle invasive bladder cancer    Cancer Staging  Malignant neoplasm of urinary bladder [facility] Staging form: Urinary Bladder, AJCC 8th Edition - Clinical stage from 10/09/2022: Stage II (cT2, cN0, cM0) - Unsigned    Prior Therapy: TURBT with gemcitabine instillation 08/29/22   Current Therapy:  Concurrent chemoradiation therapy with 5FU and [facility], started on 10/30/2022.    HISTORY OF PRESENT ILLNESS:   Oncology History  Malignant neoplasm of urinary bladder (HCC)  07/29/2022 Initial Diagnosis   Malignant neoplasm of urinary bladder   10/30/2022 -  Chemotherapy   Patient is on Treatment Plan : BLADDER Mitomycin D1 + 5FU D1-5, 21-25 + XRT        INTERVAL HISTORY:   Raven Lopez is a 67 y.o. female presenting to clinic today for follow up of muscle invasive bladder cancer. She was last seen by Dr. Ellin Saba on 10/30/2022. In the interim, She completed Cycle 1, Day 1-6 of 5FU/MMC therapy.   Ms. Radebaugh reports that she tolerated the chemotherapy very well. She experienced several episodes of diarrhea last night after eating salad. She took two doses of imodium and has not had subsequent episodes this morning. She is trying to stay hydrated with Pedialyte. She reports having some fatigue but has continued to stay active including doing chores around the house. She reports improvement of her appetite. She denies nausea, vomiting or abdominal pain. She denies easy bruising or signs of bleeding. She denies fevers, chills, sweats, shortness of breath, chest pain, cough,  peripheral edema or neuropathy. She has no other complaints. Rest of the ROS is below.    PAST MEDICAL HISTORY:   Past Medical History: Past Medical History:  Diagnosis Date   AC (acromioclavicular) joint bone spurs    Anxiety    Asthma    COPD (chronic obstructive pulmonary disease) (HCC)    DDD (degenerative disc disease), lumbar    Hypertension    Neck fracture (HCC)    Panic disorder    Scoliosis     Surgical History: Past Surgical History:  Procedure Laterality Date   BLADDER INSTILLATION N/A 08/29/2022   Procedure: BLADDER INSTILLATION;  Surgeon: Malen Gauze, MD;  Location: [facility];  Service: Urology;  Laterality: N/A;   CESAREAN SECTION     CYSTOSCOPY W/ RETROGRADES Bilateral 08/29/2022   Procedure: CYSTOSCOPY WITH RETROGRADE PYELOGRAM;  Surgeon: Malen Gauze, MD;  Location: [facility];  Service: Urology;  Laterality: Bilateral;   IR IMAGING GUIDED PORT INSERTION  10/17/2022   TRANSURETHRAL RESECTION OF BLADDER TUMOR N/A 08/29/2022   Procedure: TRANSURETHRAL RESECTION OF BLADDER TUMOR (TURBT);  Surgeon: Malen Gauze, MD;  Location: [facility];  Service: Urology;  Laterality: N/A;   wisdom teeth removal      Social History: Social History   Socioeconomic History   Marital status: Widowed    Spouse name: Not on file   Number of children: Not on file   Years of education: Not on file   Highest education level: Not on file  Occupational History   Not on file  Tobacco Use   Smoking status: Every Day  Packs/day: 1.00    Years: 40.00    Additional pack years: 0.00    Total pack years: 40.00    Types: Cigarettes   Smokeless tobacco: Never  Vaping Use   Vaping Use: Never used  Substance and Sexual Activity   Alcohol use: Not Currently   Drug use: Not Currently   Sexual activity: Not Currently    Birth control/protection: Post-menopausal  Other Topics Concern   Not on file  Social History Narrative   Not on file   Social Determinants of Health    Financial Resource Strain: Low Risk  (02/21/2022)   Overall Financial Resource Strain (CARDIA)    Difficulty of Paying Living Expenses: Not hard at all  Food Insecurity: No Food Insecurity (09/12/2022)   Hunger Vital Sign    Worried About Running Out of Food in the Last Year: Never true    Ran Out of Food in the Last Year: Never true  Transportation Needs: No Transportation Needs (09/12/2022)   PRAPARE - Administrator, Civil Service (Medical): No    Lack of Transportation (Non-Medical): No  Physical Activity: Inactive (02/21/2022)   Exercise Vital Sign    Days of Exercise per Week: 0 days    Minutes of Exercise per Session: 10 min  Stress: Stress Concern Present (02/21/2022)   [employer] of Occupational Health - Occupational Stress Questionnaire    Feeling of Stress : To some extent  Social Connections: Socially Isolated (02/21/2022)   Social Connection and Isolation Panel [NHANES]    Frequency of Communication with Friends and Family: More than three times a week    Frequency of Social Gatherings with Friends and Family: Twice a week    Attends Religious Services: Never    Database administrator or Organizations: No    Attends Banker Meetings: Never    Marital Status: Widowed  Intimate Partner Violence: Not At Risk (09/12/2022)   Humiliation, Afraid, Rape, and Kick questionnaire    Fear of Current or Ex-Partner: No    Emotionally Abused: No    Physically Abused: No    Sexually Abused: No    Family History: Family History  Problem Relation Age of Onset   Aneurysm Father    Kidney failure Mother    Hypertension Sister    Thyroid disease Sister    Anxiety disorder Sister    Colitis Sister    Irritable bowel syndrome Sister     Current Medications:  Current Outpatient Medications:    acetaminophen (TYLENOL) 500 MG tablet, Take 1,000 mg by mouth every 4 (four) hours as needed for mild pain or moderate pain., Disp: , Rfl:    amLODipine (NORVASC)  10 MG tablet, Take 10 mg by mouth at bedtime., Disp: , Rfl:    dextrose 5 % SOLN 1,000 mL with fluorouracil 5 GM/100ML SOLN, Inject into the vein., Disp: , Rfl:    diazepam (VALIUM) 10 MG tablet, Take 10 mg by mouth in the morning and at bedtime., Disp: , Rfl:    lidocaine-prilocaine (EMLA) cream, Apply to affected area once, Disp: 30 g, Rfl: 3   losartan (COZAAR) 25 MG tablet, Take 25 mg by mouth every evening., Disp: , Rfl:    megestrol (MEGACE) 400 MG/10ML suspension, Take 10 mLs (400 mg total) by mouth 2 (two) times daily., Disp: 480 mL, Rfl: 2   MITOMYCIN IV, Inject into the vein., Disp: , Rfl:    prochlorperazine (COMPAZINE) 10 MG tablet, Take 1 tablet (10 mg  total) by mouth every 6 (six) hours as needed for nausea or vomiting., Disp: 30 tablet, Rfl: 1  Current Facility-Administered Medications:    ciprofloxacin (CIPRO) tablet 500 mg, 500 mg, Oral, Once, McKenzie, Mardene Celeste, MD  Facility-Administered Medications Ordered in Other Visits:    0.9 %  sodium chloride infusion, , Intravenous, Continuous, Jaquell Seddon T, PA-C   Allergies: Allergies  Allergen Reactions   Penicillins Shortness Of Breath and Swelling    Has patient had a PCN reaction causing immediate rash, facial/tongue/throat swelling, SOB or lightheadedness with hypotension: Yes Has patient had a PCN reaction causing severe rash involving mucus membranes or skin necrosis: No Has patient had a PCN reaction that required hospitalization: Yes Has patient had a PCN reaction occurring within the last 10 years: No If all of the above answers are "NO", then may proceed with Cephalosporin use.    Aspirin     REACTION: dizzy,weak and upset stomach   Demerol [Meperidine Hcl] Other (See Comments)    Altered mental status-anger, hallucinations   Sulfonamide Derivatives Nausea And Vomiting    REVIEW OF SYSTEMS:   Review of Systems  Constitutional:  Positive for fatigue. Negative for chills and fever.  HENT:   Negative for  mouth sores and nosebleeds.   Respiratory:  Negative for cough and shortness of breath.   Cardiovascular:  Negative for chest pain, leg swelling and palpitations.  Gastrointestinal:  Positive for diarrhea. Negative for abdominal pain, constipation, nausea and vomiting.  Neurological:  Negative for dizziness, headaches and numbness.  Hematological:  Does not bruise/bleed easily.  Psychiatric/Behavioral:  The patient is nervous/anxious.   All other systems reviewed and are negative.    VITALS:   There were no vitals taken for this visit.  Wt Readings from Last 3 Encounters:  11/14/22 85 lb 12.8 oz (38.9 kg)  10/30/22 84 lb 11.2 oz (38.4 kg)  10/17/22 84 lb 7 oz (38.3 kg)    There is no height or weight on file to calculate BMI.  Performance status (ECOG): 1 - Symptomatic but completely ambulatory  PHYSICAL EXAM:   Physical Exam Vitals reviewed.  Constitutional:      Appearance: Normal appearance.  Cardiovascular:     Rate and Rhythm: Normal rate and regular rhythm.     Pulses: Normal pulses.     Heart sounds: Normal heart sounds.  Pulmonary:     Effort: Pulmonary effort is normal.     Breath sounds: Normal breath sounds.  Abdominal:     Palpations: Abdomen is soft. There is no hepatomegaly or splenomegaly.     Tenderness: There is no abdominal tenderness.  Musculoskeletal:     Right lower leg: No edema.     Left lower leg: No edema.  Neurological:     General: No focal deficit present.     Mental Status: She is alert and oriented to person, place, and time.  Psychiatric:        Mood and Affect: Mood normal.        Behavior: Behavior normal.     LABS:      Latest Ref Rng & Units 10/22/2022    2:41 PM 08/27/2022    4:11 PM 02/19/2022    2:16 PM  CBC  WBC 4.0 - 10.5 K/uL 10.9  10.4  7.9   Hemoglobin 12.0 - 15.0 g/dL 16.1  09.6  04.5   Hematocrit 36.0 - 46.0 % 40.4  45.4  43.5   Platelets 150 - 400 K/uL 289  259  241       Latest Ref Rng & Units 10/22/2022     2:41 PM 08/27/2022    4:11 PM 06/19/2022    9:30 AM  CMP  Glucose 70 - 99 mg/dL 784  696    BUN 8 - 23 mg/dL 18  14    Creatinine 2.95 - 1.00 mg/dL 2.84  1.32  4.40   Sodium 135 - 145 mmol/L 136  135    Potassium 3.5 - 5.1 mmol/L 3.8  3.8    Chloride 98 - 111 mmol/L 104  100    CO2 22 - 32 mmol/L 24  24    Calcium 8.9 - 10.3 mg/dL 8.9  9.1    Total Protein 6.5 - 8.1 g/dL 7.1     Total Bilirubin 0.3 - 1.2 mg/dL 0.4     Alkaline Phos 38 - 126 U/L 61     AST 15 - 41 U/L 13     ALT 0 - 44 U/L 9        No results found for: "CEA1", "CEA" / No results found for: "CEA1", "CEA" No results found for: "PSA1" No results found for: "NUU725" No results found for: "CAN125"  No results found for: "TOTALPROTELP", "ALBUMINELP", "A1GS", "A2GS", "BETS", "BETA2SER", "GAMS", "MSPIKE", "SPEI" No results found for: "TIBC", "FERRITIN", "IRONPCTSAT" No results found for: "LDH"   STUDIES:   IR IMAGING GUIDED PORT INSERTION  Result Date: 10/17/2022 INDICATION: History of bladder cancer. In need of durable intravenous access for chemotherapy administration. EXAM: IMPLANTED PORT A CATH PLACEMENT WITH ULTRASOUND AND FLUOROSCOPIC GUIDANCE COMPARISON:  Chest CT-09/26/2022 MEDICATIONS: None ANESTHESIA/SEDATION: Moderate (conscious) sedation was employed during this procedure as administered by the Interventional Radiology RN. A total of Versed 2 mg and Fentanyl 100 mcg was administered intravenously. Moderate Sedation Time: 30 minutes. The patient's level of consciousness and vital signs were monitored continuously by radiology nursing throughout the procedure under my direct supervision. CONTRAST:  None FLUOROSCOPY TIME:  24 seconds (1 mGy) COMPLICATIONS: None immediate. PROCEDURE: The procedure, risks, benefits, and alternatives were explained to the patient. Questions regarding the procedure were encouraged and answered. The patient understands and consents to the procedure. The right neck and chest were prepped  with chlorhexidine in a sterile fashion, and a sterile drape was applied covering the operative field. Maximum barrier sterile technique with sterile gowns and gloves were used for the procedure. A timeout was performed prior to the initiation of the procedure. Local anesthesia was provided with 1% lidocaine with epinephrine. After creating a small venotomy incision, a micropuncture kit was utilized to access the internal jugular vein. Real-time ultrasound guidance was utilized for vascular access including the acquisition of a permanent ultrasound image documenting patency of the accessed vessel. The microwire was utilized to measure appropriate catheter length. A subcutaneous port pocket was then created along the upper chest wall utilizing a combination of sharp and blunt dissection. The pocket was irrigated with sterile saline. A single lumen Slim sized power injectable port was chosen for placement. The 8 Fr catheter was tunneled from the port pocket site to the venotomy incision. The port was placed in the pocket. The external catheter was trimmed to appropriate length. At the venotomy, an 8 Fr peel-away sheath was placed over a guidewire under fluoroscopic guidance. The catheter was then placed through the sheath and the sheath was removed. Final catheter positioning was confirmed and documented with a fluoroscopic spot radiograph. The port was accessed with a Demetrios Isaacs needle,  aspirated and flushed with heparinized saline. The venotomy site was closed with an interrupted 4-0 Vicryl suture. The port pocket incision was closed with interrupted 2-0 Vicryl suture. The skin was opposed with a running subcuticular 4-0 Vicryl suture. Dermabond and Steri-strips were applied to both incisions. Dressings were applied. The patient tolerated the procedure well without immediate post procedural complication. FINDINGS: After catheter placement, the tip lies within the superior cavoatrial junction. The catheter aspirates and  flushes normally and is ready for immediate use. IMPRESSION: Successful placement of a right internal jugular approach power injectable Port-A-Cath. The catheter is ready for immediate use. Electronically Signed   By: Simonne Come M.D.   On: 10/17/2022 17:07       ASSESSMENT & PLAN:   Assessment: 1.  Stage II (T2 N0) high-grade urothelial carcinoma of the bladder: - Presented with intermittent gross hematuria ongoing since 03/2022.  CT [facility] on 06/19/2022: 4.4 cm in the roof of the bladder. - TURBT by Dr. Ronne Binning (08/29/2022): Infiltrating high-grade urothelial carcinoma, invading muscularis propria (detrusor muscle). - CT CAP (09/26/2022): Interval resection of the mass of the superior right aspect of the urinary bladder with residual posterior bladder dome and right eccentric hyperenhancing wall thickening measuring up to 1.8 cm.  No adenopathy or metastatic disease. - Patient does not want to have radical cystectomy.  She met with Dr. Langston Masker at [facility] and want to proceed with trimodality therapy. --Started Cycle 1, Day 1 of 5FU plus MMC on 10/30/2022.    2.  Social/family history: - She lives by herself and is independent of ADLs and IADLs.  She worked as a Data processing manager prior to retirement.  Current active smoker, 1 pack/day for the last 50 years, started at age 67. - Paternal grandfather had throat cancer.  Paternal grandmother had ovarian cancer.    Plan: 1.  Stage II (T2 N0) high-grade urothelial carcinoma of the bladder: -Completed Cycle 1, Day 1-6 of 5FU/MMC with radiation from 10/30/22-11/04/2022. --Tolerated very well without any prohibitive toxicities. --Labs from today were reviewed with patient. WBC 4.7, Hgb 10.9, Plt 95K, Creatinine and LFTs normal. No electrolyte abnormality.  --RTC on 11/21/2022 with labs, follow up visit wit Dr. Ellin Saba before Cycle 1, Day 21  2.  Loss of appetite: - Improved, has prescription for Megace 400 mg twice daily.  3. Diarrhea: --Suspect  secondary to food intolerance after eating salad yesterday --Continue with imodium and stay hydrated. --will give 1 L of IV fluids today  Orders Placed This Encounter  Procedures   Vitamin D 25 hydroxy    Standing Status:   Future    Number of Occurrences:   1    Standing Expiration Date:   11/14/2023      Patient and her family expressed understanding of the plan provided.   I have spent a total of 30 minutes minutes of face-to-face and non-face-to-face time, preparing to see the patient, performing a medically appropriate examination, counseling and educating the patient, ordering tests/procedures, documenting clinical information in the electronic health record, and care coordination.   Georga Kaufmann PA-C Dept of Hematology and Oncology [facility]

## 2022-11-15 DIAGNOSIS — R197 Diarrhea, unspecified: Secondary | ICD-10-CM | POA: Diagnosis not present

## 2022-11-15 DIAGNOSIS — C679 Malignant neoplasm of bladder, unspecified: Secondary | ICD-10-CM | POA: Diagnosis not present

## 2022-11-15 DIAGNOSIS — C670 Malignant neoplasm of trigone of bladder: Secondary | ICD-10-CM | POA: Diagnosis not present

## 2022-11-15 DIAGNOSIS — Z510 Encounter for antineoplastic radiation therapy: Secondary | ICD-10-CM | POA: Diagnosis not present

## 2022-11-15 DIAGNOSIS — L539 Erythematous condition, unspecified: Secondary | ICD-10-CM | POA: Diagnosis not present

## 2022-11-18 DIAGNOSIS — L539 Erythematous condition, unspecified: Secondary | ICD-10-CM | POA: Diagnosis not present

## 2022-11-18 DIAGNOSIS — C679 Malignant neoplasm of bladder, unspecified: Secondary | ICD-10-CM | POA: Diagnosis not present

## 2022-11-18 DIAGNOSIS — Z510 Encounter for antineoplastic radiation therapy: Secondary | ICD-10-CM | POA: Diagnosis not present

## 2022-11-18 DIAGNOSIS — C670 Malignant neoplasm of trigone of bladder: Secondary | ICD-10-CM | POA: Diagnosis not present

## 2022-11-18 DIAGNOSIS — R197 Diarrhea, unspecified: Secondary | ICD-10-CM | POA: Diagnosis not present

## 2022-11-19 ENCOUNTER — Other Ambulatory Visit: Payer: Self-pay

## 2022-11-19 DIAGNOSIS — C679 Malignant neoplasm of bladder, unspecified: Secondary | ICD-10-CM

## 2022-11-19 DIAGNOSIS — C670 Malignant neoplasm of trigone of bladder: Secondary | ICD-10-CM | POA: Diagnosis not present

## 2022-11-19 DIAGNOSIS — Z510 Encounter for antineoplastic radiation therapy: Secondary | ICD-10-CM | POA: Diagnosis not present

## 2022-11-19 DIAGNOSIS — R197 Diarrhea, unspecified: Secondary | ICD-10-CM | POA: Diagnosis not present

## 2022-11-19 DIAGNOSIS — L539 Erythematous condition, unspecified: Secondary | ICD-10-CM | POA: Diagnosis not present

## 2022-11-20 DIAGNOSIS — C670 Malignant neoplasm of trigone of bladder: Secondary | ICD-10-CM | POA: Diagnosis not present

## 2022-11-20 DIAGNOSIS — Z510 Encounter for antineoplastic radiation therapy: Secondary | ICD-10-CM | POA: Diagnosis not present

## 2022-11-20 DIAGNOSIS — R197 Diarrhea, unspecified: Secondary | ICD-10-CM | POA: Diagnosis not present

## 2022-11-20 DIAGNOSIS — C679 Malignant neoplasm of bladder, unspecified: Secondary | ICD-10-CM | POA: Diagnosis not present

## 2022-11-20 DIAGNOSIS — L539 Erythematous condition, unspecified: Secondary | ICD-10-CM | POA: Diagnosis not present

## 2022-11-20 NOTE — Progress Notes (Signed)
[facility] [address] [place] [address]    Clinic Day:  11/21/2022  Referring physician: Elfredia Nevins, MD  Patient Care Team: Elfredia Nevins, MD as PCP - General (Internal Medicine) Ronne Binning Mardene Celeste, MD as Consulting Physician (Urology) Nils Pyle, MD as Consulting Physician (Radiation Oncology) Doreatha Massed, MD as Consulting Physician (Hematology)   ASSESSMENT & PLAN:   Assessment: 1.  Stage II (T2 N0) high-grade urothelial carcinoma of the bladder: - Presented with intermittent gross hematuria ongoing since 03/2022.  CT [facility] on 06/19/2022: 4.4 cm in the roof of the bladder. - TURBT by Dr. Ronne Binning (08/29/2022): Infiltrating high-grade urothelial carcinoma, invading muscularis propria (detrusor muscle). - CT CAP (09/26/2022): Interval resection of the mass of the superior right aspect of the urinary bladder with residual posterior bladder dome and right eccentric hyperenhancing wall thickening measuring up to 1.8 cm.  No adenopathy or metastatic disease. - Patient does not want to have radical cystectomy.  She met with Dr. Langston Masker at [facility] and want to proceed with trimodality therapy. --Started Cycle 1, Day 1 of 5FU plus MMC on 10/30/2022.    2.  Social/family history: - She lives by herself and is independent of ADLs and IADLs.  She worked as a Data processing manager prior to retirement.  Current active smoker, 1 pack/day for the last 50 years, started at age 56. - Paternal grandfather had throat cancer.  Paternal grandmother had ovarian cancer.    Plan: 1.  Stage II (T2 N0) high-grade urothelial carcinoma of the bladder: - She has tolerated first cycle reasonably well. - She did not have any GI toxicity. - Labs today: Normal LFTs.  CBC shows thrombocytopenia with platelet count 96.  White count is stable.  Hemoglobin 11.4. - She will proceed with 5-FU infusion for 5 days today. - RTC 4 weeks for follow-up.   2.  Loss of appetite: -  She took 1 dose of Megace and is not taking it.  She lost 1 and half pounds since last visit.  She reports that she is eating better and is going out to eat daily after radiation treatment. - She was told to restart back on Megace if she continues to lose weight.   3. Diarrhea: -This was temporarily when she ate a salad at [facility] Tuesday. - She no longer has diarrhea.    Orders Placed This Encounter  Procedures   CBC with Differential    Standing Status:   Future    Standing Expiration Date:   11/21/2023   Comprehensive metabolic panel    Standing Status:   Future    Standing Expiration Date:   11/21/2023   Magnesium    Standing Status:   Future    Standing Expiration Date:   11/21/2023      I,Katie Daubenspeck,acting as a scribe for Doreatha Massed, MD.,have documented all relevant documentation on the behalf of Doreatha Massed, MD,as directed by  Doreatha Massed, MD while in the presence of Doreatha Massed, MD.   I, Doreatha Massed MD, have reviewed the above documentation for accuracy and completeness, and I agree with the above.   Doreatha Massed, MD   5/16/20246:20 PM  CHIEF COMPLAINT:   Diagnosis: muscle invasive bladder cancer    Cancer Staging  Malignant neoplasm of urinary bladder [facility]) Staging form: Urinary Bladder, AJCC 8th Edition - Clinical stage from 10/09/2022: Stage II (cT2, cN0, cM0) - Unsigned    Prior Therapy: TURBT with gemcitabine instillation 08/29/22  Current Therapy:  Concurrent chemoradiation therapy with 5FU and MMC, started on 10/30/2022.    HISTORY OF PRESENT ILLNESS:   Oncology History  Malignant neoplasm of urinary bladder (HCC)  07/29/2022 Initial Diagnosis   Malignant neoplasm of urinary bladder   10/30/2022 -  Chemotherapy   Patient is on Treatment Plan : BLADDER Mitomycin D1 + 5FU D1-5, 21-25 + XRT        INTERVAL HISTORY:   Raven Lopez is a 67 y.o. female presenting to clinic today for follow up of muscle  invasive bladder cancer. She was last seen by PA Karena Addison on 11/14/22.  Today, she states that she is doing well overall. Her appetite level is at 75%. Her energy level is at 10%.  PAST MEDICAL HISTORY:   Past Medical History: Past Medical History:  Diagnosis Date   AC (acromioclavicular) joint bone spurs    Anxiety    Asthma    COPD (chronic obstructive pulmonary disease) (HCC)    DDD (degenerative disc disease), lumbar    Hypertension    Neck fracture (HCC)    Panic disorder    Scoliosis     Surgical History: Past Surgical History:  Procedure Laterality Date   BLADDER INSTILLATION N/A 08/29/2022   Procedure: BLADDER INSTILLATION;  Surgeon: Malen Gauze, MD;  Location: [facility];  Service: Urology;  Laterality: N/A;   CESAREAN SECTION     CYSTOSCOPY W/ RETROGRADES Bilateral 08/29/2022   Procedure: CYSTOSCOPY WITH RETROGRADE PYELOGRAM;  Surgeon: Malen Gauze, MD;  Location: [facility];  Service: Urology;  Laterality: Bilateral;   IR IMAGING GUIDED PORT INSERTION  10/17/2022   TRANSURETHRAL RESECTION OF BLADDER TUMOR N/A 08/29/2022   Procedure: TRANSURETHRAL RESECTION OF BLADDER TUMOR (TURBT);  Surgeon: Malen Gauze, MD;  Location: [facility];  Service: Urology;  Laterality: N/A;   wisdom teeth removal      Social History: Social History   Socioeconomic History   Marital status: Widowed    Spouse name: Not on file   Number of children: Not on file   Years of education: Not on file   Highest education level: Not on file  Occupational History   Not on file  Tobacco Use   Smoking status: Every Day    Packs/day: 1.00    Years: 40.00    Additional pack years: 0.00    Total pack years: 40.00    Types: Cigarettes   Smokeless tobacco: Never  Vaping Use   Vaping Use: Never used  Substance and Sexual Activity   Alcohol use: Not Currently   Drug use: Not Currently   Sexual activity: Not Currently    Birth control/protection: Post-menopausal  Other Topics Concern    Not on file  Social History Narrative   Not on file   Social Determinants of Health   Financial Resource Strain: Low Risk  (02/21/2022)   Overall Financial Resource Strain (CARDIA)    Difficulty of Paying Living Expenses: Not hard at all  Food Insecurity: No Food Insecurity (09/12/2022)   Hunger Vital Sign    Worried About Running Out of Food in the Last Year: Never true    Ran Out of Food in the Last Year: Never true  Transportation Needs: No Transportation Needs (09/12/2022)   PRAPARE - Administrator, Civil Service (Medical): No    Lack of Transportation (Non-Medical): No  Physical Activity: Inactive (02/21/2022)   Exercise Vital Sign    Days of Exercise per Week: 0 days    Minutes  of Exercise per Session: 10 min  Stress: Stress Concern Present (02/21/2022)   [employer] of Occupational Health - Occupational Stress Questionnaire    Feeling of Stress : To some extent  Social Connections: Socially Isolated (02/21/2022)   Social Connection and Isolation Panel [NHANES]    Frequency of Communication with Friends and Family: More than three times a week    Frequency of Social Gatherings with Friends and Family: Twice a week    Attends Religious Services: Never    Database administrator or Organizations: No    Attends Banker Meetings: Never    Marital Status: Widowed  Intimate Partner Violence: Not At Risk (09/12/2022)   Humiliation, Afraid, Rape, and Kick questionnaire    Fear of Current or Ex-Partner: No    Emotionally Abused: No    Physically Abused: No    Sexually Abused: No    Family History: Family History  Problem Relation Age of Onset   Aneurysm Father    Kidney failure Mother    Hypertension Sister    Thyroid disease Sister    Anxiety disorder Sister    Colitis Sister    Irritable bowel syndrome Sister     Current Medications:  Current Outpatient Medications:    acetaminophen (TYLENOL) 500 MG tablet, Take 1,000 mg by mouth every 4  (four) hours as needed for mild pain or moderate pain., Disp: , Rfl:    amLODipine (NORVASC) 10 MG tablet, Take 10 mg by mouth at bedtime., Disp: , Rfl:    dextrose 5 % SOLN 1,000 mL with fluorouracil 5 GM/100ML SOLN, Inject into the vein., Disp: , Rfl:    diazepam (VALIUM) 10 MG tablet, Take 10 mg by mouth in the morning and at bedtime., Disp: , Rfl:    lidocaine-prilocaine (EMLA) cream, Apply to affected area once, Disp: 30 g, Rfl: 3   losartan (COZAAR) 25 MG tablet, Take 25 mg by mouth every evening., Disp: , Rfl:    megestrol (MEGACE) 400 MG/10ML suspension, Take 10 mLs (400 mg total) by mouth 2 (two) times daily., Disp: 480 mL, Rfl: 2   MITOMYCIN IV, Inject into the vein., Disp: , Rfl:    prochlorperazine (COMPAZINE) 10 MG tablet, Take 1 tablet (10 mg total) by mouth every 6 (six) hours as needed for nausea or vomiting., Disp: 30 tablet, Rfl: 1   Allergies: Allergies  Allergen Reactions   Penicillins Shortness Of Breath and Swelling    Has patient had a PCN reaction causing immediate rash, facial/tongue/throat swelling, SOB or lightheadedness with hypotension: Yes Has patient had a PCN reaction causing severe rash involving mucus membranes or skin necrosis: No Has patient had a PCN reaction that required hospitalization: Yes Has patient had a PCN reaction occurring within the last 10 years: No If all of the above answers are "NO", then may proceed with Cephalosporin use.    Aspirin     REACTION: dizzy,weak and upset stomach   Demerol [Meperidine Hcl] Other (See Comments)    Altered mental status-anger, hallucinations   Sulfonamide Derivatives Nausea And Vomiting    REVIEW OF SYSTEMS:   Review of Systems  Constitutional:  Negative for chills, fatigue and fever.  HENT:   Negative for lump/mass, mouth sores, nosebleeds, sore throat and trouble swallowing.   Eyes:  Negative for eye problems.  Respiratory:  Positive for cough. Negative for shortness of breath.   Cardiovascular:   Negative for chest pain, leg swelling and palpitations.  Gastrointestinal:  Negative for abdominal  pain, constipation, diarrhea, nausea and vomiting.  Genitourinary:  Negative for bladder incontinence, difficulty urinating, dysuria, frequency, hematuria and nocturia.   Musculoskeletal:  Negative for arthralgias, back pain, flank pain, myalgias and neck pain.  Skin:  Negative for itching and rash.  Neurological:  Negative for dizziness, headaches and numbness.  Hematological:  Does not bruise/bleed easily.  Psychiatric/Behavioral:  Positive for sleep disturbance. Negative for depression and suicidal ideas. The patient is not nervous/anxious.   All other systems reviewed and are negative.    VITALS:   There were no vitals taken for this visit.  Wt Readings from Last 3 Encounters:  11/21/22 83 lb 8 oz (37.9 kg)  11/14/22 85 lb 12.8 oz (38.9 kg)  10/30/22 84 lb 11.2 oz (38.4 kg)    There is no height or weight on file to calculate BMI.  Performance status (ECOG): 1 - Symptomatic but completely ambulatory  PHYSICAL EXAM:   Physical Exam Vitals and nursing note reviewed. Exam conducted with a chaperone present.  Constitutional:      Appearance: Normal appearance.  Cardiovascular:     Rate and Rhythm: Normal rate and regular rhythm.     Pulses: Normal pulses.     Heart sounds: Normal heart sounds.  Pulmonary:     Effort: Pulmonary effort is normal.     Breath sounds: Normal breath sounds.  Abdominal:     Palpations: Abdomen is soft. There is no hepatomegaly, splenomegaly or mass.     Tenderness: There is no abdominal tenderness.  Musculoskeletal:     Right lower leg: No edema.     Left lower leg: No edema.  Lymphadenopathy:     Cervical: No cervical adenopathy.     Right cervical: No superficial, deep or posterior cervical adenopathy.    Left cervical: No superficial, deep or posterior cervical adenopathy.     Upper Body:     Right upper body: No supraclavicular or axillary  adenopathy.     Left upper body: No supraclavicular or axillary adenopathy.  Neurological:     General: No focal deficit present.     Mental Status: She is alert and oriented to person, place, and time.  Psychiatric:        Mood and Affect: Mood normal.        Behavior: Behavior normal.     LABS:      Latest Ref Rng & Units 11/21/2022    8:45 AM 11/14/2022    8:42 AM 10/22/2022    2:41 PM  CBC  WBC 4.0 - 10.5 K/uL 6.1  4.7  10.9   Hemoglobin 12.0 - 15.0 g/dL 16.1  09.6  04.5   Hematocrit 36.0 - 46.0 % 34.4  32.4  40.4   Platelets 150 - 400 K/uL 96  95  289       Latest Ref Rng & Units 11/21/2022    8:45 AM 11/14/2022    8:42 AM 10/22/2022    2:41 PM  CMP  Glucose 70 - 99 mg/dL 409  811  914   BUN 8 - 23 mg/dL 12  16  18    Creatinine 0.44 - 1.00 mg/dL 7.82  9.56  2.13   Sodium 135 - 145 mmol/L 134  134  136   Potassium 3.5 - 5.1 mmol/L 3.4  3.6  3.8   Chloride 98 - 111 mmol/L 102  104  104   CO2 22 - 32 mmol/L 23  23  24    Calcium 8.9 - 10.3 mg/dL  8.8  8.7  8.9   Total Protein 6.5 - 8.1 g/dL 6.6  6.5  7.1   Total Bilirubin 0.3 - 1.2 mg/dL 0.7  0.3  0.4   Alkaline Phos 38 - 126 U/L 53  51  61   AST 15 - 41 U/L 17  15  13    ALT 0 - 44 U/L 10  9  9       No results found for: "CEA1", "CEA" / No results found for: "CEA1", "CEA" No results found for: "PSA1" No results found for: "NWG956" No results found for: "CAN125"  No results found for: "TOTALPROTELP", "ALBUMINELP", "A1GS", "A2GS", "BETS", "BETA2SER", "GAMS", "MSPIKE", "SPEI" No results found for: "TIBC", "FERRITIN", "IRONPCTSAT" No results found for: "LDH"   STUDIES:   No results found.

## 2022-11-21 ENCOUNTER — Encounter: Payer: Self-pay | Admitting: Hematology

## 2022-11-21 ENCOUNTER — Inpatient Hospital Stay: Payer: Medicare Other

## 2022-11-21 ENCOUNTER — Inpatient Hospital Stay (HOSPITAL_BASED_OUTPATIENT_CLINIC_OR_DEPARTMENT_OTHER): Payer: Medicare Other | Admitting: Hematology

## 2022-11-21 VITALS — BP 128/74 | HR 62 | Temp 98.5°F | Resp 18

## 2022-11-21 DIAGNOSIS — F17210 Nicotine dependence, cigarettes, uncomplicated: Secondary | ICD-10-CM | POA: Diagnosis not present

## 2022-11-21 DIAGNOSIS — R197 Diarrhea, unspecified: Secondary | ICD-10-CM | POA: Diagnosis not present

## 2022-11-21 DIAGNOSIS — Z8249 Family history of ischemic heart disease and other diseases of the circulatory system: Secondary | ICD-10-CM | POA: Diagnosis not present

## 2022-11-21 DIAGNOSIS — R5383 Other fatigue: Secondary | ICD-10-CM | POA: Diagnosis not present

## 2022-11-21 DIAGNOSIS — Z886 Allergy status to analgesic agent status: Secondary | ICD-10-CM | POA: Diagnosis not present

## 2022-11-21 DIAGNOSIS — C679 Malignant neoplasm of bladder, unspecified: Secondary | ICD-10-CM

## 2022-11-21 DIAGNOSIS — Z8379 Family history of other diseases of the digestive system: Secondary | ICD-10-CM | POA: Diagnosis not present

## 2022-11-21 DIAGNOSIS — L539 Erythematous condition, unspecified: Secondary | ICD-10-CM | POA: Diagnosis not present

## 2022-11-21 DIAGNOSIS — C670 Malignant neoplasm of trigone of bladder: Secondary | ICD-10-CM | POA: Diagnosis not present

## 2022-11-21 DIAGNOSIS — Z510 Encounter for antineoplastic radiation therapy: Secondary | ICD-10-CM | POA: Diagnosis not present

## 2022-11-21 DIAGNOSIS — Z808 Family history of malignant neoplasm of other organs or systems: Secondary | ICD-10-CM | POA: Diagnosis not present

## 2022-11-21 DIAGNOSIS — E876 Hypokalemia: Secondary | ICD-10-CM

## 2022-11-21 DIAGNOSIS — Z885 Allergy status to narcotic agent status: Secondary | ICD-10-CM | POA: Diagnosis not present

## 2022-11-21 DIAGNOSIS — Z8349 Family history of other endocrine, nutritional and metabolic diseases: Secondary | ICD-10-CM | POA: Diagnosis not present

## 2022-11-21 DIAGNOSIS — Z452 Encounter for adjustment and management of vascular access device: Secondary | ICD-10-CM | POA: Diagnosis not present

## 2022-11-21 DIAGNOSIS — Z923 Personal history of irradiation: Secondary | ICD-10-CM | POA: Diagnosis not present

## 2022-11-21 DIAGNOSIS — Z882 Allergy status to sulfonamides status: Secondary | ICD-10-CM | POA: Diagnosis not present

## 2022-11-21 DIAGNOSIS — Z841 Family history of disorders of kidney and ureter: Secondary | ICD-10-CM | POA: Diagnosis not present

## 2022-11-21 DIAGNOSIS — Z79899 Other long term (current) drug therapy: Secondary | ICD-10-CM | POA: Diagnosis not present

## 2022-11-21 LAB — CBC WITH DIFFERENTIAL/PLATELET
Abs Immature Granulocytes: 0.01 10*3/uL (ref 0.00–0.07)
Basophils Absolute: 0 10*3/uL (ref 0.0–0.1)
Basophils Relative: 1 %
Eosinophils Absolute: 0.6 10*3/uL — ABNORMAL HIGH (ref 0.0–0.5)
Eosinophils Relative: 10 %
HCT: 34.4 % — ABNORMAL LOW (ref 36.0–46.0)
Hemoglobin: 11.4 g/dL — ABNORMAL LOW (ref 12.0–15.0)
Immature Granulocytes: 0 %
Lymphocytes Relative: 15 %
Lymphs Abs: 0.9 10*3/uL (ref 0.7–4.0)
MCH: 31.4 pg (ref 26.0–34.0)
MCHC: 33.1 g/dL (ref 30.0–36.0)
MCV: 94.8 fL (ref 80.0–100.0)
Monocytes Absolute: 0.4 10*3/uL (ref 0.1–1.0)
Monocytes Relative: 7 %
Neutro Abs: 4.1 10*3/uL (ref 1.7–7.7)
Neutrophils Relative %: 67 %
Platelets: 96 10*3/uL — ABNORMAL LOW (ref 150–400)
RBC: 3.63 MIL/uL — ABNORMAL LOW (ref 3.87–5.11)
RDW: 15 % (ref 11.5–15.5)
WBC: 6.1 10*3/uL (ref 4.0–10.5)
nRBC: 0 % (ref 0.0–0.2)

## 2022-11-21 LAB — COMPREHENSIVE METABOLIC PANEL
ALT: 10 U/L (ref 0–44)
AST: 17 U/L (ref 15–41)
Albumin: 3.4 g/dL — ABNORMAL LOW (ref 3.5–5.0)
Alkaline Phosphatase: 53 U/L (ref 38–126)
Anion gap: 9 (ref 5–15)
BUN: 12 mg/dL (ref 8–23)
CO2: 23 mmol/L (ref 22–32)
Calcium: 8.8 mg/dL — ABNORMAL LOW (ref 8.9–10.3)
Chloride: 102 mmol/L (ref 98–111)
Creatinine, Ser: 1.01 mg/dL — ABNORMAL HIGH (ref 0.44–1.00)
GFR, Estimated: >60 mL/min (ref >60)
Glucose, Bld: 128 mg/dL — ABNORMAL HIGH (ref 70–99)
Potassium: 3.4 mmol/L — ABNORMAL LOW (ref 3.5–5.1)
Sodium: 134 mmol/L — ABNORMAL LOW (ref 135–145)
Total Bilirubin: 0.7 mg/dL (ref 0.3–1.2)
Total Protein: 6.6 g/dL (ref 6.5–8.1)

## 2022-11-21 LAB — MAGNESIUM: Magnesium: 2.2 mg/dL (ref 1.7–2.4)

## 2022-11-21 MED ORDER — SODIUM CHLORIDE 0.9% FLUSH
10.0000 mL | Freq: Once | INTRAVENOUS | Status: AC
  Administered 2022-11-21: 10 mL via INTRAVENOUS

## 2022-11-21 MED ORDER — SODIUM CHLORIDE 0.9 % IV SOLN
500.0000 mg/m2/d | INTRAVENOUS | Status: DC
  Administered 2022-11-21: 3500 mg via INTRAVENOUS
  Filled 2022-11-21: qty 70

## 2022-11-21 MED ORDER — POTASSIUM CHLORIDE CRYS ER 20 MEQ PO TBCR
40.0000 meq | EXTENDED_RELEASE_TABLET | Freq: Once | ORAL | Status: AC
  Administered 2022-11-21: 40 meq via ORAL
  Filled 2022-11-21: qty 2

## 2022-11-21 NOTE — Patient Instructions (Signed)
[facility]  Discharge Instructions: Thank you for choosing [facility] to provide your oncology and hematology care.  If you have a lab appointment with the Cancer Center - please note that after April 8th, 2024, all labs will be drawn in the cancer center.  You do not have to check in or register with the main entrance as you have in the past but will complete your check-in in the cancer center.  Wear comfortable clothing and clothing appropriate for easy access to any Portacath or PICC line.   We strive to give you quality time with your provider. You may need to reschedule your appointment if you arrive late (15 or more minutes).  Arriving late affects you and other patients whose appointments are after yours.  Also, if you miss three or more appointments without notifying the office, you may be dismissed from the clinic at the provider's discretion.      For prescription refill requests, have your pharmacy contact our office and allow 72 hours for refills to be completed.    Today you received the following chemotherapy and/or immunotherapy agents Fluorouracil.  Fluorouracil Injection What is this medication? FLUOROURACIL (flure oh YOOR a sil) treats some types of cancer. It works by slowing down the growth of cancer cells. This medicine may be used for other purposes; ask your health care provider or pharmacist if you have questions. COMMON BRAND NAME(S): Adrucil What should I tell my care team before I take this medication? They need to know if you have any of these conditions: Blood disorders Dihydropyrimidine dehydrogenase (DPD) deficiency Infection, such as chickenpox, cold sores, herpes Kidney disease Liver disease Poor nutrition Recent or ongoing radiation therapy An unusual or allergic reaction to fluorouracil, other medications, foods, dyes, or preservatives If you or your partner are pregnant or trying to get pregnant Breast-feeding How  should I use this medication? This medication is injected into a vein. It is administered by your care team in a hospital or clinic setting. Talk to your care team about the use of this medication in children. Special care may be needed. Overdosage: If you think you have taken too much of this medicine contact a poison control center or emergency room at once. NOTE: This medicine is only for you. Do not share this medicine with others. What if I miss a dose? Keep appointments for follow-up doses. It is important not to miss your dose. Call your care team if you are unable to keep an appointment. What may interact with this medication? Do not take this medication with any of the following: Live virus vaccines This medication may also interact with the following: Medications that treat or prevent blood clots, such as warfarin, enoxaparin, dalteparin This list may not describe all possible interactions. Give your health care provider a list of all the medicines, herbs, non-prescription drugs, or dietary supplements you use. Also tell them if you smoke, drink alcohol, or use illegal drugs. Some items may interact with your medicine. What should I watch for while using this medication? Your condition will be monitored carefully while you are receiving this medication. This medication may make you feel generally unwell. This is not uncommon as chemotherapy can affect healthy cells as well as cancer cells. Report any side effects. Continue your course of treatment even though you feel ill unless your care team tells you to stop. In some cases, you may be given additional medications to help with side effects. Follow all  directions for their use. This medication may increase your risk of getting an infection. Call your care team for advice if you get a fever, chills, sore throat, or other symptoms of a cold or flu. Do not treat yourself. Try to avoid being around people who are sick. This medication may  increase your risk to bruise or bleed. Call your care team if you notice any unusual bleeding. Be careful brushing or flossing your teeth or using a toothpick because you may get an infection or bleed more easily. If you have any dental work done, tell your dentist you are receiving this medication. Avoid taking medications that contain aspirin, acetaminophen, ibuprofen, naproxen, or ketoprofen unless instructed by your care team. These medications may hide a fever. Do not treat diarrhea with over the counter products. Contact your care team if you have diarrhea that lasts more than 2 days or if it is severe and watery. This medication can make you more sensitive to the sun. Keep out of the sun. If you cannot avoid being in the sun, wear protective clothing and sunscreen. Do not use sun lamps, tanning beds, or tanning booths. Talk to your care team if you or your partner wish to become pregnant or think you might be pregnant. This medication can cause serious birth defects if taken during pregnancy and for 3 months after the last dose. A reliable form of contraception is recommended while taking this medication and for 3 months after the last dose. Talk to your care team about effective forms of contraception. Do not father a child while taking this medication and for 3 months after the last dose. Use a condom while having sex during this time period. Do not breastfeed while taking this medication. This medication may cause infertility. Talk to your care team if you are concerned about your fertility. What side effects may I notice from receiving this medication? Side effects that you should report to your care team as soon as possible: Allergic reactions--skin rash, itching, hives, swelling of the face, lips, tongue, or throat Heart attack--pain or tightness in the chest, shoulders, arms, or jaw, nausea, shortness of breath, cold or clammy skin, feeling faint or lightheaded Heart failure--shortness of  breath, swelling of the ankles, feet, or hands, sudden weight gain, unusual weakness or fatigue Heart rhythm changes--fast or irregular heartbeat, dizziness, feeling faint or lightheaded, chest pain, trouble breathing High ammonia level--unusual weakness or fatigue, confusion, loss of appetite, nausea, vomiting, seizures Infection--fever, chills, cough, sore throat, wounds that don't heal, pain or trouble when passing urine, general feeling of discomfort or being unwell Low red blood cell level--unusual weakness or fatigue, dizziness, headache, trouble breathing Pain, tingling, or numbness in the hands or feet, muscle weakness, change in vision, confusion or trouble speaking, loss of balance or coordination, trouble walking, seizures Redness, swelling, and blistering of the skin over hands and feet Severe or prolonged diarrhea Unusual bruising or bleeding Side effects that usually do not require medical attention (report to your care team if they continue or are bothersome): Dry skin Headache Increased tears Nausea Pain, redness, or swelling with sores inside the mouth or throat Sensitivity to light Vomiting This list may not describe all possible side effects. Call your doctor for medical advice about side effects. You may report side effects to FDA at 1-800-FDA-1088. Where should I keep my medication? This medication is given in a hospital or clinic. It will not be stored at home. NOTE: This sheet is a summary. It may  not cover all possible information. If you have questions about this medicine, talk to your doctor, pharmacist, or health care provider.  2023 Elsevier/Gold Standard (2021-10-23 00:00:00)       To help prevent nausea and vomiting after your treatment, we encourage you to take your nausea medication as directed.  BELOW ARE SYMPTOMS THAT SHOULD BE REPORTED IMMEDIATELY: *FEVER GREATER THAN 100.4 F (38 C) OR HIGHER *CHILLS OR SWEATING *NAUSEA AND VOMITING THAT IS NOT  CONTROLLED WITH YOUR NAUSEA MEDICATION *UNUSUAL SHORTNESS OF BREATH *UNUSUAL BRUISING OR BLEEDING *URINARY PROBLEMS (pain or burning when urinating, or frequent urination) *BOWEL PROBLEMS (unusual diarrhea, constipation, pain near the anus) TENDERNESS IN MOUTH AND THROAT WITH OR WITHOUT PRESENCE OF ULCERS (sore throat, sores in mouth, or a toothache) UNUSUAL RASH, SWELLING OR PAIN  UNUSUAL VAGINAL DISCHARGE OR ITCHING   Items with * indicate a potential emergency and should be followed up as soon as possible or go to the Emergency Department if any problems should occur.  Please show the CHEMOTHERAPY ALERT CARD or IMMUNOTHERAPY ALERT CARD at check-in to the Emergency Department and triage nurse.  Should you have questions after your visit or need to cancel or reschedule your appointment, please contact [facility] [phone]  and follow the prompts.  Office hours are 8:00 a.m. to 4:30 p.m. Monday - Friday. Please note that voicemails left after 4:00 p.m. may not be returned until the following business day.  We are closed weekends and major holidays. You have access to a nurse at all times for urgent questions. Please call the main number to the clinic [phone] and follow the prompts.  For any non-urgent questions, you may also contact your provider using MyChart. We now offer e-Visits for anyone 64 and older to request care online for non-urgent symptoms. For details visit [email or web address].   Also download the MyChart app! Go to the app store, search "MyChart", open the app, select [facility], and log in with your MyChart username and password.

## 2022-11-21 NOTE — Patient Instructions (Signed)
[facility] at [facility] Discharge Instructions   You were seen and examined today by Dr. Katragadda.  He reviewed the results of your lab work which are normal/stable.   We will proceed with your treatment today.  Return as scheduled.    Thank you for choosing [facility] at [facility] to provide your oncology and hematology care.  To afford each patient quality time with our provider, please arrive at least 15 minutes before your scheduled appointment time.   If you have a lab appointment with the Cancer Center please come in thru the Main Entrance and check in at the main information desk.  You need to re-schedule your appointment should you arrive 10 or more minutes late.  We strive to give you quality time with our providers, and arriving late affects you and other patients whose appointments are after yours.  Also, if you no show three or more times for appointments you may be dismissed from the clinic at the providers discretion.     Again, thank you for choosing [facility].  Our hope is that these requests will decrease the amount of time that you wait before being seen by our physicians.       _____________________________________________________________  Should you have questions after your visit to [facility], please contact our office at [phone] and follow the prompts.  Our office hours are 8:00 a.m. and 4:30 p.m. Monday - Friday.  Please note that voicemails left after 4:00 p.m. may not be returned until the following business day.  We are closed weekends and major holidays.  You do have access to a nurse 24-7, just call the main number to the clinic [phone] and do not press any options, hold on the line and a nurse will answer the phone.    For prescription refill requests, have your pharmacy contact our office and allow 72 hours.    Due to Covid, you will need to wear a mask upon entering  the hospital. If you do not have a mask, a mask will be given to you at the Main Entrance upon arrival. For doctor visits, patients may have 1 support person age 18 or older with them. For treatment visits, patients can not have anyone with them due to social distancing guidelines and our immunocompromised population.      

## 2022-11-21 NOTE — Progress Notes (Signed)
Patients port flushed without difficulty.  Good blood return noted with no bruising or swelling noted at site.  Stable during access and blood draw.  Patient to remain accessed for treatment. 

## 2022-11-21 NOTE — Progress Notes (Signed)
Patient presents today for chemotherapy of 5FU pump. Patient is in satisfactory condition with no new complaints voiced.  Vital signs are stable.  Labs reviewed by Dr. Ellin Saba during the office visit and all labs are within treatment parameters with exception of platelets 96 and potassium 3.4. Patient to get of potassium PO and we will proceed with treatment per MD orders.   Port flushed well with blood return noted. 5FU pump connected with no problems. No complaints voiced by patient.  Patient left via wheelchair in stable condition with son.  Vital signs stable at discharge.  Follow up as scheduled.

## 2022-11-22 DIAGNOSIS — Z510 Encounter for antineoplastic radiation therapy: Secondary | ICD-10-CM | POA: Diagnosis not present

## 2022-11-22 DIAGNOSIS — L539 Erythematous condition, unspecified: Secondary | ICD-10-CM | POA: Diagnosis not present

## 2022-11-22 DIAGNOSIS — C670 Malignant neoplasm of trigone of bladder: Secondary | ICD-10-CM | POA: Diagnosis not present

## 2022-11-22 DIAGNOSIS — R197 Diarrhea, unspecified: Secondary | ICD-10-CM | POA: Diagnosis not present

## 2022-11-22 DIAGNOSIS — C679 Malignant neoplasm of bladder, unspecified: Secondary | ICD-10-CM | POA: Diagnosis not present

## 2022-11-25 DIAGNOSIS — L539 Erythematous condition, unspecified: Secondary | ICD-10-CM | POA: Diagnosis not present

## 2022-11-25 DIAGNOSIS — C679 Malignant neoplasm of bladder, unspecified: Secondary | ICD-10-CM | POA: Diagnosis not present

## 2022-11-25 DIAGNOSIS — C670 Malignant neoplasm of trigone of bladder: Secondary | ICD-10-CM | POA: Diagnosis not present

## 2022-11-25 DIAGNOSIS — Z510 Encounter for antineoplastic radiation therapy: Secondary | ICD-10-CM | POA: Diagnosis not present

## 2022-11-25 DIAGNOSIS — R197 Diarrhea, unspecified: Secondary | ICD-10-CM | POA: Diagnosis not present

## 2022-11-26 ENCOUNTER — Inpatient Hospital Stay: Payer: Medicare Other

## 2022-11-26 VITALS — BP 117/84 | HR 78 | Temp 97.8°F | Resp 18

## 2022-11-26 DIAGNOSIS — Z808 Family history of malignant neoplasm of other organs or systems: Secondary | ICD-10-CM | POA: Diagnosis not present

## 2022-11-26 DIAGNOSIS — Z8249 Family history of ischemic heart disease and other diseases of the circulatory system: Secondary | ICD-10-CM | POA: Diagnosis not present

## 2022-11-26 DIAGNOSIS — Z841 Family history of disorders of kidney and ureter: Secondary | ICD-10-CM | POA: Diagnosis not present

## 2022-11-26 DIAGNOSIS — R5383 Other fatigue: Secondary | ICD-10-CM | POA: Diagnosis not present

## 2022-11-26 DIAGNOSIS — C670 Malignant neoplasm of trigone of bladder: Secondary | ICD-10-CM | POA: Diagnosis not present

## 2022-11-26 DIAGNOSIS — Z79899 Other long term (current) drug therapy: Secondary | ICD-10-CM | POA: Diagnosis not present

## 2022-11-26 DIAGNOSIS — Z885 Allergy status to narcotic agent status: Secondary | ICD-10-CM | POA: Diagnosis not present

## 2022-11-26 DIAGNOSIS — Z8379 Family history of other diseases of the digestive system: Secondary | ICD-10-CM | POA: Diagnosis not present

## 2022-11-26 DIAGNOSIS — Z8349 Family history of other endocrine, nutritional and metabolic diseases: Secondary | ICD-10-CM | POA: Diagnosis not present

## 2022-11-26 DIAGNOSIS — Z510 Encounter for antineoplastic radiation therapy: Secondary | ICD-10-CM | POA: Diagnosis not present

## 2022-11-26 DIAGNOSIS — Z923 Personal history of irradiation: Secondary | ICD-10-CM | POA: Diagnosis not present

## 2022-11-26 DIAGNOSIS — R197 Diarrhea, unspecified: Secondary | ICD-10-CM | POA: Diagnosis not present

## 2022-11-26 DIAGNOSIS — C679 Malignant neoplasm of bladder, unspecified: Secondary | ICD-10-CM

## 2022-11-26 DIAGNOSIS — Z886 Allergy status to analgesic agent status: Secondary | ICD-10-CM | POA: Diagnosis not present

## 2022-11-26 DIAGNOSIS — Z452 Encounter for adjustment and management of vascular access device: Secondary | ICD-10-CM | POA: Diagnosis not present

## 2022-11-26 DIAGNOSIS — L539 Erythematous condition, unspecified: Secondary | ICD-10-CM | POA: Diagnosis not present

## 2022-11-26 DIAGNOSIS — Z882 Allergy status to sulfonamides status: Secondary | ICD-10-CM | POA: Diagnosis not present

## 2022-11-26 DIAGNOSIS — F17210 Nicotine dependence, cigarettes, uncomplicated: Secondary | ICD-10-CM | POA: Diagnosis not present

## 2022-11-26 MED ORDER — HEPARIN SOD (PORK) LOCK FLUSH 100 UNIT/ML IV SOLN
500.0000 [IU] | Freq: Once | INTRAVENOUS | Status: AC | PRN
  Administered 2022-11-26: 500 [IU]

## 2022-11-26 MED ORDER — SODIUM CHLORIDE 0.9% FLUSH
10.0000 mL | INTRAVENOUS | Status: DC | PRN
  Administered 2022-11-26: 10 mL

## 2022-11-26 NOTE — Patient Instructions (Signed)
[facility]  Discharge Instructions: Thank you for choosing [facility] to provide your oncology and hematology care.  If you have a lab appointment with the Cancer Center - please note that after April 8th, 2024, all labs will be drawn in the cancer center.  You do not have to check in or register with the main entrance as you have in the past but will complete your check-in in the cancer center.  Wear comfortable clothing and clothing appropriate for easy access to any Portacath or PICC line.   We strive to give you quality time with your provider. You may need to reschedule your appointment if you arrive late (15 or more minutes).  Arriving late affects you and other patients whose appointments are after yours.  Also, if you miss three or more appointments without notifying the office, you may be dismissed from the clinic at the provider's discretion.      For prescription refill requests, have your pharmacy contact our office and allow 72 hours for refills to be completed.    Today you received the following pump d/c, return as scheduled.   To help prevent nausea and vomiting after your treatment, we encourage you to take your nausea medication as directed.  BELOW ARE SYMPTOMS THAT SHOULD BE REPORTED IMMEDIATELY: *FEVER GREATER THAN 100.4 F (38 C) OR HIGHER *CHILLS OR SWEATING *NAUSEA AND VOMITING THAT IS NOT CONTROLLED WITH YOUR NAUSEA MEDICATION *UNUSUAL SHORTNESS OF BREATH *UNUSUAL BRUISING OR BLEEDING *URINARY PROBLEMS (pain or burning when urinating, or frequent urination) *BOWEL PROBLEMS (unusual diarrhea, constipation, pain near the anus) TENDERNESS IN MOUTH AND THROAT WITH OR WITHOUT PRESENCE OF ULCERS (sore throat, sores in mouth, or a toothache) UNUSUAL RASH, SWELLING OR PAIN  UNUSUAL VAGINAL DISCHARGE OR ITCHING   Items with * indicate a potential emergency and should be followed up as soon as possible or go to the Emergency Department if  any problems should occur.  Please show the CHEMOTHERAPY ALERT CARD or IMMUNOTHERAPY ALERT CARD at check-in to the Emergency Department and triage nurse.  Should you have questions after your visit or need to cancel or reschedule your appointment, please contact [facility] CENTER AT [facility] [phone]  and follow the prompts.  Office hours are 8:00 a.m. to 4:30 p.m. Monday - Friday. Please note that voicemails left after 4:00 p.m. may not be returned until the following business day.  We are closed weekends and major holidays. You have access to a nurse at all times for urgent questions. Please call the main number to the clinic [phone] and follow the prompts.  For any non-urgent questions, you may also contact your provider using MyChart. We now offer e-Visits for anyone 26 and older to request care online for non-urgent symptoms. For details visit [email or web address].   Also download the MyChart app! Go to the app store, search "MyChart", open the app, select [place], and log in with your MyChart username and password.

## 2022-11-27 ENCOUNTER — Inpatient Hospital Stay: Payer: Medicare Other

## 2022-11-27 ENCOUNTER — Inpatient Hospital Stay: Payer: Medicare Other | Admitting: Hematology

## 2022-11-28 ENCOUNTER — Inpatient Hospital Stay: Payer: Medicare Other | Admitting: Hematology

## 2022-11-28 ENCOUNTER — Inpatient Hospital Stay: Payer: Medicare Other

## 2022-12-01 ENCOUNTER — Emergency Department (HOSPITAL_COMMUNITY)
Admission: EM | Admit: 2022-12-01 | Discharge: 2022-12-02 | Disposition: A | Discharge status: Home / Self Care | Payer: Medicare Other | Attending: Emergency Medicine | Admitting: Emergency Medicine

## 2022-12-01 ENCOUNTER — Other Ambulatory Visit: Payer: Self-pay

## 2022-12-01 ENCOUNTER — Encounter (HOSPITAL_COMMUNITY): Payer: Self-pay | Admitting: Emergency Medicine

## 2022-12-01 DIAGNOSIS — R531 Weakness: Secondary | ICD-10-CM | POA: Insufficient documentation

## 2022-12-01 DIAGNOSIS — R197 Diarrhea, unspecified: Secondary | ICD-10-CM | POA: Diagnosis present

## 2022-12-01 DIAGNOSIS — C679 Malignant neoplasm of bladder, unspecified: Secondary | ICD-10-CM | POA: Diagnosis not present

## 2022-12-01 DIAGNOSIS — K529 Noninfective gastroenteritis and colitis, unspecified: Secondary | ICD-10-CM | POA: Diagnosis not present

## 2022-12-01 DIAGNOSIS — N281 Cyst of kidney, acquired: Secondary | ICD-10-CM | POA: Diagnosis not present

## 2022-12-01 DIAGNOSIS — H538 Other visual disturbances: Secondary | ICD-10-CM | POA: Diagnosis not present

## 2022-12-01 DIAGNOSIS — K6389 Other specified diseases of intestine: Secondary | ICD-10-CM | POA: Diagnosis not present

## 2022-12-01 LAB — COMPREHENSIVE METABOLIC PANEL
ALT: 11 U/L (ref 0–44)
AST: 17 U/L (ref 15–41)
Albumin: 3.3 g/dL — ABNORMAL LOW (ref 3.5–5.0)
Alkaline Phosphatase: 44 U/L (ref 38–126)
Anion gap: 13 (ref 5–15)
BUN: 10 mg/dL (ref 8–23)
CO2: 21 mmol/L — ABNORMAL LOW (ref 22–32)
Calcium: 8.5 mg/dL — ABNORMAL LOW (ref 8.9–10.3)
Chloride: 101 mmol/L (ref 98–111)
Creatinine, Ser: 0.82 mg/dL (ref 0.44–1.00)
GFR, Estimated: >60 mL/min (ref >60)
Glucose, Bld: 95 mg/dL (ref 70–99)
Potassium: 3.9 mmol/L (ref 3.5–5.1)
Sodium: 135 mmol/L (ref 135–145)
Total Bilirubin: 0.4 mg/dL (ref 0.3–1.2)
Total Protein: 6.4 g/dL — ABNORMAL LOW (ref 6.5–8.1)

## 2022-12-01 LAB — CBC
HCT: 35 % — ABNORMAL LOW (ref 36.0–46.0)
Hemoglobin: 11.6 g/dL — ABNORMAL LOW (ref 12.0–15.0)
MCH: 31.4 pg (ref 26.0–34.0)
MCHC: 33.1 g/dL (ref 30.0–36.0)
MCV: 94.6 fL (ref 80.0–100.0)
Platelets: 125 10*3/uL — ABNORMAL LOW (ref 150–400)
RBC: 3.7 MIL/uL — ABNORMAL LOW (ref 3.87–5.11)
RDW: 15.3 % (ref 11.5–15.5)
WBC: 5.1 10*3/uL (ref 4.0–10.5)
nRBC: 0 % (ref 0.0–0.2)

## 2022-12-01 LAB — LIPASE, BLOOD: Lipase: 181 U/L — ABNORMAL HIGH (ref 11–51)

## 2022-12-01 NOTE — ED Triage Notes (Signed)
Pt via POV c/o blurred, "hazy" vision and diarrhea since earlier today. Pt is in active treatment for stage II bladder cancer. Estimates 2 episodes diarrhea today with some improvement in appetite.

## 2022-12-02 ENCOUNTER — Emergency Department (HOSPITAL_COMMUNITY): Payer: Medicare Other

## 2022-12-02 DIAGNOSIS — N281 Cyst of kidney, acquired: Secondary | ICD-10-CM | POA: Diagnosis not present

## 2022-12-02 DIAGNOSIS — K6389 Other specified diseases of intestine: Secondary | ICD-10-CM | POA: Diagnosis not present

## 2022-12-02 LAB — URINALYSIS, ROUTINE W REFLEX MICROSCOPIC
Bacteria, UA: NONE SEEN
Bilirubin Urine: NEGATIVE
Glucose, UA: NEGATIVE mg/dL
Hgb urine dipstick: NEGATIVE
Ketones, ur: 5 mg/dL — AB
Leukocytes,Ua: NEGATIVE
Nitrite: NEGATIVE
Protein, ur: 30 mg/dL — AB
Specific Gravity, Urine: 1.006 (ref 1.005–1.030)
pH: 7 (ref 5.0–8.0)

## 2022-12-02 MED ORDER — TOBRAMYCIN 0.3 % OP SOLN
1.0000 [drp] | OPHTHALMIC | Status: DC
  Administered 2022-12-02: 1 [drp] via OPHTHALMIC
  Filled 2022-12-02: qty 5

## 2022-12-02 MED ORDER — FLUCONAZOLE 200 MG PO TABS: 200.0000 mg | ORAL_TABLET | Freq: Every day | ORAL | 0 refills | Status: DC

## 2022-12-02 MED ORDER — METRONIDAZOLE 500 MG PO TABS: 500.0000 mg | ORAL_TABLET | Freq: Three times a day (TID) | ORAL | 0 refills | Status: DC

## 2022-12-02 MED ORDER — SODIUM CHLORIDE 0.9 % IV BOLUS
500.0000 mL | Freq: Once | INTRAVENOUS | Status: AC
  Administered 2022-12-02: 500 mL via INTRAVENOUS

## 2022-12-02 MED ORDER — METRONIDAZOLE 500 MG PO TABS
500.0000 mg | ORAL_TABLET | Freq: Once | ORAL | Status: AC
  Administered 2022-12-02: 500 mg via ORAL
  Filled 2022-12-02: qty 1

## 2022-12-02 NOTE — ED Provider Notes (Signed)
[facility]ERGENCY DEPARTMENT AT [facility] Provider Note   CSN: [id number] Arrival date & time: 12/01/22  2219     History  Chief Complaint  Patient presents with   Blurred Vision   Diarrhea    Raven Lopez is a 67 y.o. female.  Patient presents to the emergency department for evaluation multiple problems.  Patient complaining of generalized weakness.  She has had 2 episodes of diarrhea today.  She has not noticed any blood in her stool.  Along with her generalized weakness she feels like her vision is hazy.  She reports that when she wakes up every morning there is a crust in both of her eyes.  No eye pain.       Home Medications Prior to Admission medications   Medication Sig Start Date End Date Taking? Authorizing Provider  metroNIDAZOLE (FLAGYL) 500 MG tablet Take 1 tablet (500 mg total) by mouth 3 (three) times daily. 12/02/22  Yes Dario Yono, Canary Brim, MD  acetaminophen (TYLENOL) 500 MG tablet Take 1,000 mg by mouth every 4 (four) hours as needed for mild pain or moderate pain.    [provider]  amLODipine (NORVASC) 10 MG tablet Take 10 mg by mouth at bedtime.    [provider]  dextrose 5 % SOLN 1,000 mL with fluorouracil 5 GM/100ML SOLN Inject into the vein. 10/30/22   [provider]  diazepam (VALIUM) 10 MG tablet Take 10 mg by mouth in the morning and at bedtime.    [provider]  lidocaine-prilocaine (EMLA) cream Apply to affected area once 10/22/22   Doreatha Massed, MD  losartan (COZAAR) 25 MG tablet Take 25 mg by mouth every evening. 12/14/21   [provider]  megestrol (MEGACE) 400 MG/10ML suspension Take 10 mLs (400 mg total) by mouth 2 (two) times daily. 10/30/22   Doreatha Massed, MD  MITOMYCIN IV Inject into the vein. 10/30/22   [provider]  prochlorperazine (COMPAZINE) 10 MG tablet Take 1 tablet (10 mg total) by mouth every 6 (six) hours as needed for nausea or vomiting.  10/22/22   Doreatha Massed, MD      Allergies    Penicillins, Aspirin, Demerol [meperidine hcl], and Sulfonamide derivatives    Review of Systems   Review of Systems  Physical Exam Updated Vital Signs BP 134/79   Pulse 72   Temp 98.7 F (37.1 C) (Oral)   Resp 16   Ht 5\' 2"  (1.575 m)   Wt 37.2 kg   SpO2 99%   BMI 15.00 kg/m  Physical Exam Vitals and nursing note reviewed.  Constitutional:      General: She is not in acute distress.    Appearance: She is well-developed.  HENT:     Head: Normocephalic and atraumatic.     Mouth/Throat:     Mouth: Mucous membranes are moist.  Eyes:     General: Vision grossly intact. Gaze aligned appropriately.     Extraocular Movements: Extraocular movements intact.     Conjunctiva/sclera: Conjunctivae normal.  Cardiovascular:     Rate and Rhythm: Normal rate and regular rhythm.     Pulses: Normal pulses.     Heart sounds: Normal heart sounds, S1 normal and S2 normal. No murmur heard.    No friction rub. No gallop.  Pulmonary:     Effort: Pulmonary effort is normal. No respiratory distress.     Breath sounds: Normal breath sounds.  Abdominal:     General: Bowel sounds are  normal.     Palpations: Abdomen is soft.     Tenderness: There is no abdominal tenderness. There is no guarding or rebound.     Hernia: No hernia is present.  Musculoskeletal:        General: No swelling.     Cervical back: Full passive range of motion without pain, normal range of motion and neck supple. No spinous process tenderness or muscular tenderness. Normal range of motion.     Right lower leg: No edema.     Left lower leg: No edema.  Skin:    General: Skin is warm and dry.     Capillary Refill: Capillary refill takes less than 2 seconds.     Findings: No ecchymosis, erythema, rash or wound.  Neurological:     General: No focal deficit present.     Mental Status: She is alert and oriented to person, place, and time.     GCS: GCS eye subscore is 4.  GCS verbal subscore is 5. GCS motor subscore is 6.     Cranial Nerves: Cranial nerves 2-12 are intact.     Sensory: Sensation is intact.     Motor: Motor function is intact.     Coordination: Coordination is intact.  Psychiatric:        Attention and Perception: Attention normal.        Mood and Affect: Mood normal.        Speech: Speech normal.        Behavior: Behavior normal.     ED Results / Procedures / Treatments   Labs (all labs ordered are listed, but only abnormal results are displayed) Labs Reviewed  LIPASE, BLOOD - Abnormal; Notable for the following components:      Result Value   Lipase 181 (*)    All other components within normal limits  COMPREHENSIVE METABOLIC PANEL - Abnormal; Notable for the following components:   CO2 21 (*)    Calcium 8.5 (*)    Total Protein 6.4 (*)    Albumin 3.3 (*)    All other components within normal limits  CBC - Abnormal; Notable for the following components:   RBC 3.70 (*)    Hemoglobin 11.6 (*)    HCT 35.0 (*)    Platelets 125 (*)    All other components within normal limits  URINALYSIS, ROUTINE W REFLEX MICROSCOPIC - Abnormal; Notable for the following components:   Color, Urine STRAW (*)    Ketones, ur 5 (*)    Protein, ur 30 (*)    All other components within normal limits    EKG None  Radiology CT ABDOMEN PELVIS WO CONTRAST  Result Date: 12/02/2022 CLINICAL DATA:  Abdominal pain, acute, nonlocalized. Diarrhea. Stage II bladder cancer. EXAM: CT ABDOMEN AND PELVIS WITHOUT CONTRAST TECHNIQUE: Multidetector CT imaging of the abdomen and pelvis was performed following the standard protocol without IV contrast. RADIATION DOSE REDUCTION: This exam was performed according to the departmental dose-optimization program which includes automated exposure control, adjustment of the mA and/or kV according to patient size and/or use of iterative reconstruction technique. COMPARISON:  06/19/2022. FINDINGS: Lower chest: Heart is normal  in size and there is a trace pericardial effusion. Calcification of the mitral valve annulus is noted. The lung bases are clear. Hepatobiliary: No focal liver abnormality is seen. Stones are present within the gallbladder. The common bile duct is distended measuring up to 1.3 cm, unchanged. No intrahepatic biliary ductal dilatation is seen. Pancreas: There is dilatation of  the pancreatic duct at the head measuring 4 mm. No surrounding inflammatory changes. Spleen: Normal in size without focal abnormality. Adrenals/Urinary Tract: The adrenal glands are within normal limits. There is a cyst in the lower pole of the right kidney. No renal calculus or obstructive uropathy bilaterally. There is a irregular lobular thickening of the bladder wall on the right and superior aspect measuring up to 1.3 cm on coronal image 39, unchanged. Stomach/Bowel: Stomach is within normal limits. Appendix is not seen. There is bowel wall thickening involving the sigmoid colon with mild surrounding fat stranding. No bowel obstruction, free air or pneumatosis. Vascular/Lymphatic: Aortic atherosclerosis with multifocal ectasia. No abdominal or pelvic lymphadenopathy is seen. Reproductive: Uterus and bilateral adnexa are unremarkable. Other: No abdominopelvic ascites. Musculoskeletal: Degenerative changes and stable compression deformities are noted in the thoracolumbar spine. No acute or suspicious osseous abnormality. IMPRESSION: 1. Thickening of the walls of the sigmoid colon with surrounding fat stranding suggesting colitis. 2. Irregular lobular mass in the superior aspect of the urinary bladder on the right, not significantly changed from the prior exam. 3. Stable prominence of the common bile duct measuring up to 1.3 cm with mild dilatation of the pancreatic duct measuring up to 4 mm, not significantly changed from prior exams. 4. Aortic atherosclerosis. Electronically Signed   By: Thornell Sartorius M.D.   On: 12/02/2022 01:45   CT HEAD WO  CONTRAST ( )  Result Date: 12/02/2022 CLINICAL DATA:  Mental status change, unknown cause.  Hazy vision. EXAM: CT HEAD WITHOUT CONTRAST TECHNIQUE: Contiguous axial images were obtained from the base of the skull through the vertex without intravenous contrast. RADIATION DOSE REDUCTION: This exam was performed according to the departmental dose-optimization program which includes automated exposure control, adjustment of the mA and/or kV according to patient size and/or use of iterative reconstruction technique. COMPARISON:  None Available. FINDINGS: Brain: No acute intracranial hemorrhage, midline shift or mass effect. No extra-axial fluid collection. Diffuse atrophy is noted. Periventricular and subcortical white matter hypodensities are present bilaterally. No hydrocephalus. Vascular: No hyperdense vessel or unexpected calcification. Skull: Normal. Negative for fracture or focal lesion. Sinuses/Orbits: No acute finding. Other: None. IMPRESSION: 1. No acute intracranial process. 2. Atrophy with chronic microvascular ischemic changes. Electronically Signed   By: Thornell Sartorius M.D.   On: 12/02/2022 01:31    Procedures Procedures    Medications Ordered in ED Medications  metroNIDAZOLE (FLAGYL) tablet 500 mg (has no administration in time range)  tobramycin (TOBREX) 0.3 % ophthalmic solution 1 drop (has no administration in time range)  sodium chloride 0.9 % bolus 500 mL (500 mLs Intravenous New Bag/Given 12/02/22 0054)    ED Course/ Medical Decision Making/ A&P                             Medical Decision Making Amount and/or Complexity of Data Reviewed External Data Reviewed: labs, radiology and notes. Labs: ordered. Decision-making details documented in ED Course. Radiology: ordered and independent interpretation performed. Decision-making details documented in ED Course.   Patient with generalized weakness.  She just completed chemotherapy for bladder cancer.  She also has had radiation  therapy to the bladder.  Patient complaining of diarrhea tonight associated with generalized weakness.  Lab work is unremarkable other than mildly elevated lipase.  Patient underwent CT abdomen and pelvis to further evaluate.  She does have some findings of colitis.  I would not expect her to have radiation colitis after radiation of  the bladder, this will be treated as infectious colitis.  Patient reports a history of near anaphylaxis with penicillins.  She also reports significant intolerance of Cipro.  Will try Flagyl alone at first, follow-up with primary care.  Return to the ER if symptoms worsen, consider adding Bactrim or cephalosporin at that time.  Patient complaining of blurred and hazy vision.  This is binocular in nature on exam.  No eye pain.  She does have some mild erythema of both of her conjunctiva and reports waking up most mornings with a thick crust on her eyes.  She thinks it might be allergies.  Recommended allergy pill, will also prescribe antibiotic drops.  Remainder of neurologic exam is normal, nonfocal.  Head CT negative.        Final Clinical Impression(s) / ED Diagnoses Final diagnoses:  Colitis    Rx / DC Orders ED Discharge Orders          Ordered    metroNIDAZOLE (FLAGYL) 500 MG tablet  3 times daily        12/02/22 0220              Gilda Crease, MD 12/02/22 0221

## 2022-12-05 DIAGNOSIS — M419 Scoliosis, unspecified: Secondary | ICD-10-CM | POA: Diagnosis not present

## 2022-12-05 DIAGNOSIS — M519 Unspecified thoracic, thoracolumbar and lumbosacral intervertebral disc disorder: Secondary | ICD-10-CM | POA: Diagnosis not present

## 2022-12-05 DIAGNOSIS — C679 Malignant neoplasm of bladder, unspecified: Secondary | ICD-10-CM | POA: Diagnosis not present

## 2022-12-05 DIAGNOSIS — Z452 Encounter for adjustment and management of vascular access device: Secondary | ICD-10-CM | POA: Diagnosis not present

## 2022-12-05 DIAGNOSIS — I10 Essential (primary) hypertension: Secondary | ICD-10-CM | POA: Diagnosis not present

## 2022-12-05 DIAGNOSIS — F17210 Nicotine dependence, cigarettes, uncomplicated: Secondary | ICD-10-CM | POA: Diagnosis not present

## 2022-12-05 DIAGNOSIS — J4489 Other specified chronic obstructive pulmonary disease: Secondary | ICD-10-CM | POA: Diagnosis not present

## 2022-12-09 DIAGNOSIS — H25811 Combined forms of age-related cataract, right eye: Secondary | ICD-10-CM | POA: Diagnosis not present

## 2022-12-09 DIAGNOSIS — H25812 Combined forms of age-related cataract, left eye: Secondary | ICD-10-CM | POA: Diagnosis not present

## 2022-12-18 ENCOUNTER — Inpatient Hospital Stay: Payer: Medicare Other | Admitting: Hematology

## 2022-12-18 ENCOUNTER — Inpatient Hospital Stay: Payer: Medicare Other

## 2022-12-19 ENCOUNTER — Other Ambulatory Visit: Payer: Self-pay

## 2022-12-20 DIAGNOSIS — M519 Unspecified thoracic, thoracolumbar and lumbosacral intervertebral disc disorder: Secondary | ICD-10-CM | POA: Diagnosis not present

## 2022-12-20 DIAGNOSIS — I10 Essential (primary) hypertension: Secondary | ICD-10-CM | POA: Diagnosis not present

## 2022-12-20 DIAGNOSIS — Z452 Encounter for adjustment and management of vascular access device: Secondary | ICD-10-CM | POA: Diagnosis not present

## 2022-12-20 DIAGNOSIS — M419 Scoliosis, unspecified: Secondary | ICD-10-CM | POA: Diagnosis not present

## 2022-12-20 DIAGNOSIS — J4489 Other specified chronic obstructive pulmonary disease: Secondary | ICD-10-CM | POA: Diagnosis not present

## 2022-12-20 DIAGNOSIS — F17210 Nicotine dependence, cigarettes, uncomplicated: Secondary | ICD-10-CM | POA: Diagnosis not present

## 2022-12-20 DIAGNOSIS — C679 Malignant neoplasm of bladder, unspecified: Secondary | ICD-10-CM | POA: Diagnosis not present

## 2022-12-24 ENCOUNTER — Ambulatory Visit: Payer: Medicare Other | Admitting: Urology

## 2022-12-24 VITALS — BP 128/71 | HR 80

## 2022-12-24 DIAGNOSIS — R300 Dysuria: Secondary | ICD-10-CM

## 2022-12-24 DIAGNOSIS — C679 Malignant neoplasm of bladder, unspecified: Secondary | ICD-10-CM | POA: Diagnosis not present

## 2022-12-24 MED ORDER — CIPROFLOXACIN HCL 500 MG PO TABS
500.0000 mg | ORAL_TABLET | Freq: Once | ORAL | Status: DC
Start: 2022-12-24 — End: 2022-12-24

## 2022-12-24 MED ORDER — DIAZEPAM 10 MG PO TABS: 10.0000 mg | ORAL_TABLET | Freq: Once | ORAL | 0 refills | Status: AC

## 2022-12-24 MED ORDER — FLUCONAZOLE 100 MG PO TABS
100.0000 mg | ORAL_TABLET | Freq: Every day | ORAL | 0 refills | Status: DC
Start: 2022-12-24 — End: 2023-02-12

## 2022-12-24 NOTE — Progress Notes (Signed)
12/24/2022 2:17 PM   CAMELLIA POPESCU [birth year] [id number]  Referring provider: Elfredia Nevins, MD [address] [place],  [place] [address]  Followup bladder cancer   HPI: Ms Percival is a [phone] here for followup for muscle invasive bladder cancer and new dysuria. She completed radiation and chemotherapy. She has new vaginal irritation and has used OTC monostat and 1 dose of diflucan. She has intermittent dysuria for the past 2 weeks. No gross hematuria    PMH: Past Medical History:  Diagnosis Date   AC (acromioclavicular) joint bone spurs    Anxiety    Asthma    COPD (chronic obstructive pulmonary disease) (HCC)    DDD (degenerative disc disease), lumbar    Hypertension    Neck fracture (HCC)    Panic disorder    Scoliosis     Surgical History: Past Surgical History:  Procedure Laterality Date   BLADDER INSTILLATION N/A 08/29/2022   Procedure: BLADDER INSTILLATION;  Surgeon: Malen Gauze, MD;  Location: [facility];  Service: Urology;  Laterality: N/A;   CESAREAN SECTION     CYSTOSCOPY W/ RETROGRADES Bilateral 08/29/2022   Procedure: CYSTOSCOPY WITH RETROGRADE PYELOGRAM;  Surgeon: Malen Gauze, MD;  Location: [facility];  Service: Urology;  Laterality: Bilateral;   IR IMAGING GUIDED PORT INSERTION  10/17/2022   TRANSURETHRAL RESECTION OF BLADDER TUMOR N/A 08/29/2022   Procedure: TRANSURETHRAL RESECTION OF BLADDER TUMOR (TURBT);  Surgeon: Malen Gauze, MD;  Location: [facility];  Service: Urology;  Laterality: N/A;   wisdom teeth removal      Home Medications:  Allergies as of 12/24/2022       Reactions   Penicillins Shortness Of Breath, Swelling   Has patient had a PCN reaction causing immediate rash, facial/tongue/throat swelling, SOB or lightheadedness with hypotension: Yes Has patient had a PCN reaction causing severe rash involving mucus membranes or skin necrosis: No Has patient had a PCN reaction that required hospitalization: Yes Has patient had a PCN  reaction occurring within the last 10 years: No If all of the above answers are "NO", then may proceed with Cephalosporin use.   Aspirin    REACTION: dizzy,weak and upset stomach   Demerol [meperidine Hcl] Other (See Comments)   Altered mental status-anger, hallucinations   Sulfonamide Derivatives Nausea And Vomiting        Medication List        Accurate as of December 24, 2022  2:17 PM. If you have any questions, ask your nurse or doctor.          STOP taking these medications    fluconazole 200 MG tablet Commonly known as: DIFLUCAN   metroNIDAZOLE 500 MG tablet Commonly known as: FLAGYL       TAKE these medications    acetaminophen 500 MG tablet Commonly known as: TYLENOL Take 1,000 mg by mouth every 4 (four) hours as needed for mild pain or moderate pain.   amLODipine 10 MG tablet Commonly known as: NORVASC Take 10 mg by mouth at bedtime.   dextrose 5 % SOLN 1,000 mL with fluorouracil 5 GM/100ML SOLN Inject into the vein.   diazepam 10 MG tablet Commonly known as: VALIUM Take 10 mg by mouth in the morning and at bedtime.   lidocaine-prilocaine cream Commonly known as: EMLA Apply to affected area once   losartan 25 MG tablet Commonly known as: COZAAR Take 25 mg by mouth every evening.   megestrol 400 MG/10ML suspension Commonly known as: MEGACE Take 10 mLs (400  mg total) by mouth 2 (two) times daily.   MITOMYCIN IV Inject into the vein.   prochlorperazine 10 MG tablet Commonly known as: COMPAZINE Take 1 tablet (10 mg total) by mouth every 6 (six) hours as needed for nausea or vomiting.        Allergies:  Allergies  Allergen Reactions   Penicillins Shortness Of Breath and Swelling    Has patient had a PCN reaction causing immediate rash, facial/tongue/throat swelling, SOB or lightheadedness with hypotension: Yes Has patient had a PCN reaction causing severe rash involving mucus membranes or skin necrosis: No Has patient had a PCN reaction  that required hospitalization: Yes Has patient had a PCN reaction occurring within the last 10 years: No If all of the above answers are "NO", then may proceed with Cephalosporin use.    Aspirin     REACTION: dizzy,weak and upset stomach   Demerol [Meperidine Hcl] Other (See Comments)    Altered mental status-anger, hallucinations   Sulfonamide Derivatives Nausea And Vomiting    Family History: Family History  Problem Relation Age of Onset   Aneurysm Father    Kidney failure Mother    Hypertension Sister    Thyroid disease Sister    Anxiety disorder Sister    Colitis Sister    Irritable bowel syndrome Sister     Social History:  reports that she has been smoking cigarettes. She has a 40.00 pack-year smoking history. She has never used smokeless tobacco. She reports that she does not currently use alcohol. She reports that she does not currently use drugs.  ROS: All other review of systems were reviewed and are negative except what is noted above in HPI  Physical Exam: BP 128/71   Pulse 80   Constitutional:  Alert and oriented, No acute distress. HEENT: [place] AT, moist mucus membranes.  Trachea midline, no masses. Cardiovascular: No clubbing, cyanosis, or edema. Respiratory: Normal respiratory effort, no increased work of breathing. GI: Abdomen is soft, nontender, nondistended, no abdominal masses GU: No CVA tenderness.  Lymph: No cervical or inguinal lymphadenopathy. Skin: No rashes, bruises or suspicious lesions. Neurologic: Grossly intact, no focal deficits, moving all 4 extremities. Psychiatric: Normal mood and affect.  Laboratory Data: Lab Results  Component Value Date   WBC 5.1 12/01/2022   HGB 11.6 (L) 12/01/2022   HCT 35.0 (L) 12/01/2022   MCV 94.6 12/01/2022   PLT 125 (L) 12/01/2022    Lab Results  Component Value Date   CREATININE 0.82 12/01/2022    No results found for: "PSA"  No results found for: "TESTOSTERONE"  No results found for:  "HGBA1C"  Urinalysis    Component Value Date/Time   COLORURINE STRAW (A) 12/02/2022 0053   APPEARANCEUR CLEAR 12/02/2022 0053   APPEARANCEUR Clear 09/30/2022 1453   LABSPEC 1.006 12/02/2022 0053   PHURINE 7.0 12/02/2022 0053   GLUCOSEU NEGATIVE 12/02/2022 0053   HGBUR NEGATIVE 12/02/2022 0053   BILIRUBINUR NEGATIVE 12/02/2022 0053   BILIRUBINUR Negative 09/30/2022 1453   KETONESUR 5 (A) 12/02/2022 0053   PROTEINUR 30 (A) 12/02/2022 0053   UROBILINOGEN 0.2 02/15/2022 1325   UROBILINOGEN 0.2 04/05/2010 0558   NITRITE NEGATIVE 12/02/2022 0053   LEUKOCYTESUR NEGATIVE 12/02/2022 0053    Lab Results  Component Value Date   LABMICR See below: 09/30/2022   WBCUA 6-10 (A) 09/30/2022   LABEPIT 0-10 09/30/2022   BACTERIA NONE SEEN 12/02/2022    Pertinent Imaging:  No results found for this or any previous visit.  No results  found for this or any previous visit.  No results found for this or any previous visit.  No results found for this or any previous visit.  No results found for this or any previous visit.  No valid procedures specified. No results found for this or any previous visit.  No results found for this or any previous visit.   Assessment & Plan:    1. Malignant neoplasm of urinary bladder, unspecified site (HCC) -folowup 1 week for cystoscopy  - Urinalysis, Routine w reflex microscopic - Cystoscopy  2. Dysuria Diflucan 100mg  daily for 7 days   No follow-ups on file.  Wilkie Aye, MD  [facility] [place]

## 2022-12-25 DIAGNOSIS — Z452 Encounter for adjustment and management of vascular access device: Secondary | ICD-10-CM | POA: Diagnosis not present

## 2022-12-25 DIAGNOSIS — C679 Malignant neoplasm of bladder, unspecified: Secondary | ICD-10-CM | POA: Diagnosis not present

## 2022-12-25 DIAGNOSIS — M419 Scoliosis, unspecified: Secondary | ICD-10-CM | POA: Diagnosis not present

## 2022-12-25 DIAGNOSIS — M519 Unspecified thoracic, thoracolumbar and lumbosacral intervertebral disc disorder: Secondary | ICD-10-CM | POA: Diagnosis not present

## 2022-12-25 DIAGNOSIS — F17210 Nicotine dependence, cigarettes, uncomplicated: Secondary | ICD-10-CM | POA: Diagnosis not present

## 2022-12-25 DIAGNOSIS — I10 Essential (primary) hypertension: Secondary | ICD-10-CM | POA: Diagnosis not present

## 2022-12-25 DIAGNOSIS — J4489 Other specified chronic obstructive pulmonary disease: Secondary | ICD-10-CM | POA: Diagnosis not present

## 2022-12-25 LAB — MICROSCOPIC EXAMINATION: Bacteria, UA: NONE SEEN

## 2022-12-25 LAB — URINALYSIS, ROUTINE W REFLEX MICROSCOPIC
Bilirubin, UA: NEGATIVE
Glucose, UA: NEGATIVE
Leukocytes,UA: NEGATIVE
Nitrite, UA: NEGATIVE
Specific Gravity, UA: 1.025 (ref 1.005–1.030)
Urobilinogen, Ur: 0.2 mg/dL (ref 0.2–1.0)
pH, UA: 6.5 (ref 5.0–7.5)

## 2022-12-31 ENCOUNTER — Encounter: Payer: Self-pay | Admitting: Urology

## 2022-12-31 NOTE — Patient Instructions (Signed)

## 2023-01-01 ENCOUNTER — Ambulatory Visit: Payer: Medicare Other | Admitting: Urology

## 2023-01-01 VITALS — BP 129/70 | HR 80

## 2023-01-01 DIAGNOSIS — C679 Malignant neoplasm of bladder, unspecified: Secondary | ICD-10-CM

## 2023-01-01 DIAGNOSIS — Z8551 Personal history of malignant neoplasm of bladder: Secondary | ICD-10-CM

## 2023-01-01 LAB — URINALYSIS, ROUTINE W REFLEX MICROSCOPIC
Bilirubin, UA: NEGATIVE
Glucose, UA: NEGATIVE
Ketones, UA: NEGATIVE
Leukocytes,UA: NEGATIVE
Nitrite, UA: NEGATIVE
RBC, UA: NEGATIVE
Specific Gravity, UA: 1.025 (ref 1.005–1.030)
Urobilinogen, Ur: 0.2 mg/dL (ref 0.2–1.0)
pH, UA: 6 (ref 5.0–7.5)

## 2023-01-01 LAB — MICROSCOPIC EXAMINATION
Bacteria, UA: NONE SEEN
Epithelial Cells (non renal): >10 /hpf — AB (ref 0–10)

## 2023-01-01 MED ORDER — CIPROFLOXACIN HCL 500 MG PO TABS
500.0000 mg | ORAL_TABLET | Freq: Once | ORAL | Status: AC
Start: 2023-01-01 — End: 2023-01-01
  Administered 2023-01-01: 500 mg via ORAL

## 2023-01-01 NOTE — Progress Notes (Signed)
   01/01/23  CC: followup bladder cancer  HPI: Raven Lopez is a 67yo here for followup for bladder cancer Blood pressure 129/70, pulse 80. NED. A&Ox3.   No respiratory distress   Abd soft, NT, ND Normal external genitalia with patent urethral meatus  Cystoscopy Procedure Note  Patient identification was confirmed, informed consent was obtained, and patient was prepped using Betadine solution.  Lidocaine jelly was administered per urethral meatus.    Procedure: - Flexible cystoscope introduced, without any difficulty.   - Thorough search of the bladder revealed:    normal urethral meatus    normal urothelium    no stones    no ulcers     no tumors    no urethral polyps    no trabeculation  - Ureteral orifices were normal in position and appearance.  Post-Procedure: - Patient tolerated the procedure well  Assessment/ Plan:  followup 3 months for cystoscopy  No follow-ups on file.  Wilkie Aye, MD

## 2023-01-02 ENCOUNTER — Other Ambulatory Visit: Payer: Self-pay

## 2023-01-02 DIAGNOSIS — H25811 Combined forms of age-related cataract, right eye: Secondary | ICD-10-CM | POA: Diagnosis not present

## 2023-01-05 ENCOUNTER — Encounter: Payer: Self-pay | Admitting: Urology

## 2023-01-05 NOTE — Patient Instructions (Signed)

## 2023-01-08 ENCOUNTER — Encounter: Payer: Self-pay | Admitting: Hematology

## 2023-01-08 ENCOUNTER — Inpatient Hospital Stay (HOSPITAL_BASED_OUTPATIENT_CLINIC_OR_DEPARTMENT_OTHER): Payer: Medicare Other | Admitting: Hematology

## 2023-01-08 ENCOUNTER — Inpatient Hospital Stay: Payer: Medicare Other | Attending: Hematology

## 2023-01-08 DIAGNOSIS — Z818 Family history of other mental and behavioral disorders: Secondary | ICD-10-CM | POA: Insufficient documentation

## 2023-01-08 DIAGNOSIS — Z8379 Family history of other diseases of the digestive system: Secondary | ICD-10-CM | POA: Insufficient documentation

## 2023-01-08 DIAGNOSIS — Z79899 Other long term (current) drug therapy: Secondary | ICD-10-CM | POA: Diagnosis not present

## 2023-01-08 DIAGNOSIS — Z8041 Family history of malignant neoplasm of ovary: Secondary | ICD-10-CM | POA: Diagnosis not present

## 2023-01-08 DIAGNOSIS — Z886 Allergy status to analgesic agent status: Secondary | ICD-10-CM | POA: Diagnosis not present

## 2023-01-08 DIAGNOSIS — Z841 Family history of disorders of kidney and ureter: Secondary | ICD-10-CM | POA: Insufficient documentation

## 2023-01-08 DIAGNOSIS — G479 Sleep disorder, unspecified: Secondary | ICD-10-CM | POA: Diagnosis not present

## 2023-01-08 DIAGNOSIS — Z8249 Family history of ischemic heart disease and other diseases of the circulatory system: Secondary | ICD-10-CM | POA: Insufficient documentation

## 2023-01-08 DIAGNOSIS — R635 Abnormal weight gain: Secondary | ICD-10-CM | POA: Diagnosis not present

## 2023-01-08 DIAGNOSIS — R5383 Other fatigue: Secondary | ICD-10-CM | POA: Insufficient documentation

## 2023-01-08 DIAGNOSIS — R310 Gross hematuria: Secondary | ICD-10-CM | POA: Diagnosis not present

## 2023-01-08 DIAGNOSIS — Z880 Allergy status to penicillin: Secondary | ICD-10-CM | POA: Insufficient documentation

## 2023-01-08 DIAGNOSIS — Z882 Allergy status to sulfonamides status: Secondary | ICD-10-CM | POA: Diagnosis not present

## 2023-01-08 DIAGNOSIS — M2550 Pain in unspecified joint: Secondary | ICD-10-CM | POA: Diagnosis not present

## 2023-01-08 DIAGNOSIS — F17210 Nicotine dependence, cigarettes, uncomplicated: Secondary | ICD-10-CM | POA: Insufficient documentation

## 2023-01-08 DIAGNOSIS — Z8349 Family history of other endocrine, nutritional and metabolic diseases: Secondary | ICD-10-CM | POA: Diagnosis not present

## 2023-01-08 DIAGNOSIS — C679 Malignant neoplasm of bladder, unspecified: Secondary | ICD-10-CM | POA: Insufficient documentation

## 2023-01-08 DIAGNOSIS — Z885 Allergy status to narcotic agent status: Secondary | ICD-10-CM | POA: Insufficient documentation

## 2023-01-08 DIAGNOSIS — Z923 Personal history of irradiation: Secondary | ICD-10-CM | POA: Diagnosis not present

## 2023-01-08 LAB — CBC WITH DIFFERENTIAL/PLATELET
Abs Immature Granulocytes: 0.03 10*3/uL (ref 0.00–0.07)
Basophils Absolute: 0 10*3/uL (ref 0.0–0.1)
Basophils Relative: 1 %
Eosinophils Absolute: 0.2 10*3/uL (ref 0.0–0.5)
Eosinophils Relative: 3 %
HCT: 37.2 % (ref 36.0–46.0)
Hemoglobin: 12.3 g/dL (ref 12.0–15.0)
Immature Granulocytes: 0 %
Lymphocytes Relative: 15 %
Lymphs Abs: 1.1 10*3/uL (ref 0.7–4.0)
MCH: 32.5 pg (ref 26.0–34.0)
MCHC: 33.1 g/dL (ref 30.0–36.0)
MCV: 98.2 fL (ref 80.0–100.0)
Monocytes Absolute: 0.7 10*3/uL (ref 0.1–1.0)
Monocytes Relative: 9 %
Neutro Abs: 5.3 10*3/uL (ref 1.7–7.7)
Neutrophils Relative %: 72 %
Platelets: 274 10*3/uL (ref 150–400)
RBC: 3.79 MIL/uL — ABNORMAL LOW (ref 3.87–5.11)
RDW: 16.8 % — ABNORMAL HIGH (ref 11.5–15.5)
WBC: 7.4 10*3/uL (ref 4.0–10.5)
nRBC: 0 % (ref 0.0–0.2)

## 2023-01-08 LAB — COMPREHENSIVE METABOLIC PANEL
ALT: 10 U/L (ref 0–44)
AST: 15 U/L (ref 15–41)
Albumin: 3.7 g/dL (ref 3.5–5.0)
Alkaline Phosphatase: 57 U/L (ref 38–126)
Anion gap: 9 (ref 5–15)
BUN: 15 mg/dL (ref 8–23)
CO2: 23 mmol/L (ref 22–32)
Calcium: 8.8 mg/dL — ABNORMAL LOW (ref 8.9–10.3)
Chloride: 103 mmol/L (ref 98–111)
Creatinine, Ser: 0.83 mg/dL (ref 0.44–1.00)
GFR, Estimated: >60 mL/min (ref >60)
Glucose, Bld: 104 mg/dL — ABNORMAL HIGH (ref 70–99)
Potassium: 3.8 mmol/L (ref 3.5–5.1)
Sodium: 135 mmol/L (ref 135–145)
Total Bilirubin: 0.6 mg/dL (ref 0.3–1.2)
Total Protein: 6.8 g/dL (ref 6.5–8.1)

## 2023-01-08 LAB — MAGNESIUM: Magnesium: 2.1 mg/dL (ref 1.7–2.4)

## 2023-01-08 MED ORDER — HEPARIN SOD (PORK) LOCK FLUSH 100 UNIT/ML IV SOLN: 500.0000 [IU] | Freq: Once | INTRAVENOUS | Status: DC

## 2023-01-08 MED ORDER — SODIUM CHLORIDE 0.9% FLUSH
10.0000 mL | Freq: Once | INTRAVENOUS | Status: AC
  Administered 2023-01-08: 10 mL via INTRAVENOUS

## 2023-01-08 NOTE — Patient Instructions (Signed)
[facility]  Discharge Instructions  You were seen and examined today by Dr. Ellin Saba.  Dr. Ellin Saba discussed your most recent lab work which revealed that everything looks good and stable.  Dr. Ellin Saba will repeat CT of your chest abdomen and pelvis.  Follow-up as scheduled in 4 months.    Thank you for choosing [facility] to provide your oncology and hematology care.   To afford each patient quality time with our provider, please arrive at least 15 minutes before your scheduled appointment time. You may need to reschedule your appointment if you arrive late (10 or more minutes). Arriving late affects you and other patients whose appointments are after yours.  Also, if you miss three or more appointments without notifying the office, you may be dismissed from the clinic at the provider's discretion.    Again, thank you for choosing [facility]  Our hope is that these requests will decrease the amount of time that you wait before being seen by our physicians.   If you have a lab appointment with the Cancer Center - please note that after April 8th, all labs will be drawn in the cancer center.  You do not have to check in or register with the main entrance as you have in the past but will complete your check-in at the cancer center.            _____________________________________________________________  Should you have questions after your visit to [facility], please contact our office at [phone] and follow the prompts.  Our office hours are 8:00 a.m. to 4:30 p.m. Monday - Thursday and 8:00 a.m. to 2:30 p.m. Friday.  Please note that voicemails left after 4:00 p.m. may not be returned until the following business day.  We are closed weekends and all major holidays.  You do have access to a nurse 24-7, just call the main number to the clinic [phone] and do not press any options, hold on the  line and a nurse will answer the phone.    For prescription refill requests, have your pharmacy contact our office and allow 72 hours.    Masks are no longer required in the cancer centers. If you would like for your care team to wear a mask while they are taking care of you, please let them know. You may have one support person who is at least 69 years old accompany you for your appointments.

## 2023-01-08 NOTE — Progress Notes (Signed)
[facility] [address] [place] [address]    Clinic Day:  01/17/2023  Referring physician: Elfredia Nevins, MD  Patient Care Team: Elfredia Nevins, MD as PCP - General (Internal Medicine) Ronne Binning Mardene Celeste, MD as Consulting Physician (Urology) Nils Pyle, MD as Consulting Physician (Radiation Oncology) Doreatha Massed, MD as Consulting Physician (Hematology)   ASSESSMENT & PLAN:   Assessment: 1.  Stage II (T2 N0) high-grade urothelial carcinoma of the bladder: - Presented with intermittent gross hematuria ongoing since 03/2022.  CT [facility] on 06/19/2022: 4.4 cm in the roof of the bladder. - TURBT by Dr. Ronne Binning (08/29/2022): Infiltrating high-grade urothelial carcinoma, invading muscularis propria (detrusor muscle). - CT CAP (09/26/2022): Interval resection of the mass of the superior right aspect of the urinary bladder with residual posterior bladder dome and right eccentric hyperenhancing wall thickening measuring up to 1.8 cm.  No adenopathy or metastatic disease. - Patient does not want to have radical cystectomy.  She met with Dr. Langston Masker at [facility] and want to proceed with trimodality therapy. -- 5-FU plus mitomycin and XRT from 10/30/2022 through 12/01/2022.   2.  Social/family history: - She lives by herself and is independent of ADLs and IADLs.  She worked as a Data processing manager prior to retirement.  Current active smoker, 1 pack/day for the last 50 years, started at age 32. - Paternal grandfather had throat cancer.  Paternal grandmother had ovarian cancer.    Plan: 1.  Stage II (T2 N0) high-grade urothelial carcinoma of the bladder: - She has completed XRT on 12/01/2022. - Reviewed CTAP without contrast from 12/02/2022: Thickening of the walls of sigmoid colon with surrounding colitis.  Irregular lobular mass in the superior aspect of the urinary bladder on the right, not significantly changed from prior exam. - Labs today: Normal LFTs.   Creatinine normal.  CBC grossly normal. - Recommend follow-up in 4 months with repeat CT CAP without contrast.       Orders Placed This Encounter  Procedures   CT CHEST ABDOMEN PELVIS WO CONTRAST    Standing Status:   Future    Standing Expiration Date:   01/08/2024    Order Specific Question:   Preferred imaging location?    Answer:   [facility]    Order Specific Question:   If indicated for the ordered procedure, I authorize the administration of oral contrast media per Radiology protocol    Answer:   Yes    Order Specific Question:   Does the patient have a contrast media/X-ray dye allergy?    Answer:   No    Order Specific Question:   Release to patient    Answer:   Immediate   CBC with Differential/Platelet    Standing Status:   Future    Standing Expiration Date:   01/08/2024    Order Specific Question:   Release to patient    Answer:   Immediate   Comprehensive metabolic panel    Standing Status:   Future    Standing Expiration Date:   01/08/2024    Order Specific Question:   Release to patient    Answer:   Immediate     I,Alexis Herring,acting as a scribe for Doreatha Massed, MD.,have documented all relevant documentation on the behalf of Doreatha Massed, MD,as directed by  Doreatha Massed, MD while in the presence of Doreatha Massed, MD.  I, Doreatha Massed MD, have reviewed the above documentation for accuracy and completeness, and I  agree with the above.    Doreatha Massed, MD   7/12/20245:11 PM  CHIEF COMPLAINT:   Diagnosis: muscle invasive bladder cancer    Cancer Staging  Malignant neoplasm of urinary bladder [facility]) Staging form: Urinary Bladder, AJCC 8th Edition - Clinical stage from 10/09/2022: Stage II (cT2, cN0, cM0) - Unsigned    Prior Therapy: TURBT with gemcitabine instillation 08/29/22   Current Therapy:  Concurrent chemoradiation therapy with 5FU and [facility], started on 10/30/2022.   HISTORY OF PRESENT ILLNESS:    Oncology History  Malignant neoplasm of urinary bladder (HCC)  07/29/2022 Initial Diagnosis   Malignant neoplasm of urinary bladder   10/30/2022 -  Chemotherapy   Patient is on Treatment Plan : BLADDER Mitomycin D1 + 5FU D1-5, 21-25 + XRT        INTERVAL HISTORY:   Shanette is a 66 y.o. female presenting to clinic today for follow up of muscle invasive bladder cancer. She was last seen by me on 11/21/22.  She was treated in the ED for colitis on 12/01/22. She was started on Flagyl at that time.  Today, she states that she is doing well overall. Her appetite level is at 75%. Her energy level is at 25%. Pt is accompanied by family.  She notes she has gained weight and recently had a successful cataract surgery. She states that her bowels have become more regular since her ED visit. She reports improved appetite and denies problems with nausea and vomiting.   She notes that Dr. Ronne Binning told her she was in remission at her last visit with him.   PAST MEDICAL HISTORY:   Past Medical History: Past Medical History:  Diagnosis Date   AC (acromioclavicular) joint bone spurs    Anxiety    Asthma    COPD (chronic obstructive pulmonary disease) (HCC)    DDD (degenerative disc disease), lumbar    Hypertension    Neck fracture (HCC)    Panic disorder    Scoliosis     Surgical History: Past Surgical History:  Procedure Laterality Date   BLADDER INSTILLATION N/A 08/29/2022   Procedure: BLADDER INSTILLATION;  Surgeon: Malen Gauze, MD;  Location: [facility];  Service: Urology;  Laterality: N/A;   CESAREAN SECTION     CYSTOSCOPY W/ RETROGRADES Bilateral 08/29/2022   Procedure: CYSTOSCOPY WITH RETROGRADE PYELOGRAM;  Surgeon: Malen Gauze, MD;  Location: [facility];  Service: Urology;  Laterality: Bilateral;   IR IMAGING GUIDED PORT INSERTION  10/17/2022   TRANSURETHRAL RESECTION OF BLADDER TUMOR N/A 08/29/2022   Procedure: TRANSURETHRAL RESECTION OF BLADDER TUMOR (TURBT);  Surgeon:  Malen Gauze, MD;  Location: [facility];  Service: Urology;  Laterality: N/A;   wisdom teeth removal      Social History: Social History   Socioeconomic History   Marital status: Widowed    Spouse name: Not on file   Number of children: Not on file   Years of education: Not on file   Highest education level: Not on file  Occupational History   Not on file  Tobacco Use   Smoking status: Every Day    Current packs/day: 1.00    Average packs/day: 1 pack/day for 40.0 years (40.0 ttl pk-yrs)    Types: Cigarettes   Smokeless tobacco: Never  Vaping Use   Vaping status: Never Used  Substance and Sexual Activity   Alcohol use: Not Currently   Drug use: Not Currently   Sexual activity: Not Currently    Birth control/protection: Post-menopausal  Other  Topics Concern   Not on file  Social History Narrative   Not on file   Social Determinants of Health   Financial Resource Strain: Low Risk  (02/21/2022)   Overall Financial Resource Strain (CARDIA)    Difficulty of Paying Living Expenses: Not hard at all  Food Insecurity: No Food Insecurity (09/12/2022)   Hunger Vital Sign    Worried About Running Out of Food in the Last Year: Never true    Ran Out of Food in the Last Year: Never true  Transportation Needs: No Transportation Needs (09/12/2022)   PRAPARE - Administrator, Civil Service (Medical): No    Lack of Transportation (Non-Medical): No  Physical Activity: Inactive (02/21/2022)   Exercise Vital Sign    Days of Exercise per Week: 0 days    Minutes of Exercise per Session: 10 min  Stress: Stress Concern Present (02/21/2022)   [employer] of Occupational Health - Occupational Stress Questionnaire    Feeling of Stress : To some extent  Social Connections: Socially Isolated (02/21/2022)   Social Connection and Isolation Panel [NHANES]    Frequency of Communication with Friends and Family: More than three times a week    Frequency of Social Gatherings with Friends  and Family: Twice a week    Attends Religious Services: Never    Database administrator or Organizations: No    Attends Banker Meetings: Never    Marital Status: Widowed  Intimate Partner Violence: Not At Risk (09/12/2022)   Humiliation, Afraid, Rape, and Kick questionnaire    Fear of Current or Ex-Partner: No    Emotionally Abused: No    Physically Abused: No    Sexually Abused: No    Family History: Family History  Problem Relation Age of Onset   Aneurysm Father    Kidney failure Mother    Hypertension Sister    Thyroid disease Sister    Anxiety disorder Sister    Colitis Sister    Irritable bowel syndrome Sister     Current Medications:  Current Outpatient Medications:    acetaminophen (TYLENOL) 500 MG tablet, Take 1,000 mg by mouth every 4 (four) hours as needed for mild pain or moderate pain., Disp: , Rfl:    amLODipine (NORVASC) 10 MG tablet, Take 10 mg by mouth at bedtime., Disp: , Rfl:    dextrose 5 % SOLN 1,000 mL with fluorouracil 5 GM/100ML SOLN, Inject into the vein., Disp: , Rfl:    diazepam (VALIUM) 10 MG tablet, Take 10 mg by mouth in the morning and at bedtime., Disp: , Rfl:    fluconazole (DIFLUCAN) 100 MG tablet, Take 1 tablet (100 mg total) by mouth daily. X 7 days, Disp: 7 tablet, Rfl: 0   lidocaine-prilocaine (EMLA) cream, Apply to affected area once, Disp: 30 g, Rfl: 3   losartan (COZAAR) 25 MG tablet, Take 25 mg by mouth every evening., Disp: , Rfl:    megestrol (MEGACE) 400 MG/10ML suspension, Take 10 mLs (400 mg total) by mouth 2 (two) times daily., Disp: 480 mL, Rfl: 2   MITOMYCIN IV, Inject into the vein., Disp: , Rfl:    ofloxacin (OCUFLOX) 0.3 % ophthalmic solution, Place 2 drops into the right eye 4 (four) times daily., Disp: , Rfl:    prednisoLONE acetate (PRED FORTE) 1 % ophthalmic suspension, Place into the right eye 4 (four) times daily., Disp: , Rfl:    prochlorperazine (COMPAZINE) 10 MG tablet, Take 1 tablet (10 mg total) by  mouth  every 6 (six) hours as needed for nausea or vomiting., Disp: 30 tablet, Rfl: 1   Allergies: Allergies  Allergen Reactions   Penicillins Shortness Of Breath and Swelling    Has patient had a PCN reaction causing immediate rash, facial/tongue/throat swelling, SOB or lightheadedness with hypotension: Yes Has patient had a PCN reaction causing severe rash involving mucus membranes or skin necrosis: No Has patient had a PCN reaction that required hospitalization: Yes Has patient had a PCN reaction occurring within the last 10 years: No If all of the above answers are "NO", then may proceed with Cephalosporin use.    Aspirin     REACTION: dizzy,weak and upset stomach   Demerol [Meperidine Hcl] Other (See Comments)    Altered mental status-anger, hallucinations   Sulfonamide Derivatives Nausea And Vomiting    REVIEW OF SYSTEMS:   Review of Systems  Constitutional:  Positive for fatigue. Negative for chills and fever.  HENT:   Negative for lump/mass, mouth sores, nosebleeds, sore throat and trouble swallowing.   Eyes:  Negative for eye problems.  Respiratory:  Negative for cough and shortness of breath.   Cardiovascular:  Negative for chest pain, leg swelling and palpitations.  Gastrointestinal:  Negative for abdominal pain, constipation, diarrhea, nausea and vomiting.  Genitourinary:  Negative for bladder incontinence, difficulty urinating, dysuria, frequency, hematuria and nocturia.   Musculoskeletal:  Positive for arthralgias (in knee, 4/10 severity) and back pain (4/10 severity). Negative for flank pain, myalgias and neck pain.  Skin:  Negative for itching and rash.  Neurological:  Positive for dizziness. Negative for headaches and numbness.  Hematological:  Does not bruise/bleed easily.  Psychiatric/Behavioral:  Positive for sleep disturbance. Negative for depression and suicidal ideas. The patient is not nervous/anxious.   All other systems reviewed and are negative.    VITALS:    There were no vitals taken for this visit.  Wt Readings from Last 3 Encounters:  01/08/23 79 lb 12.8 oz (36.2 kg)  12/01/22 82 lb (37.2 kg)  11/21/22 83 lb 8 oz (37.9 kg)    There is no height or weight on file to calculate BMI.  Performance status (ECOG): 1 - Symptomatic but completely ambulatory  PHYSICAL EXAM:   Physical Exam Vitals and nursing note reviewed. Exam conducted with a chaperone present.  Constitutional:      Appearance: Normal appearance.  Cardiovascular:     Rate and Rhythm: Normal rate and regular rhythm.     Pulses: Normal pulses.     Heart sounds: Normal heart sounds.  Pulmonary:     Effort: Pulmonary effort is normal.     Breath sounds: Normal breath sounds.  Abdominal:     Palpations: Abdomen is soft. There is no hepatomegaly, splenomegaly or mass.     Tenderness: There is no abdominal tenderness.  Musculoskeletal:     Right lower leg: No edema.     Left lower leg: No edema.  Lymphadenopathy:     Cervical: No cervical adenopathy.     Right cervical: No superficial, deep or posterior cervical adenopathy.    Left cervical: No superficial, deep or posterior cervical adenopathy.     Upper Body:     Right upper body: No supraclavicular or axillary adenopathy.     Left upper body: No supraclavicular or axillary adenopathy.  Neurological:     General: No focal deficit present.     Mental Status: She is alert and oriented to person, place, and time.  Psychiatric:  Mood and Affect: Mood normal.        Behavior: Behavior normal.     LABS:      Latest Ref Rng & Units 01/08/2023    2:57 PM 12/01/2022   11:08 PM 11/21/2022    8:45 AM  CBC  WBC 4.0 - 10.5 K/uL 7.4  5.1  6.1   Hemoglobin 12.0 - 15.0 g/dL 60.4  54.0  98.1   Hematocrit 36.0 - 46.0 % 37.2  35.0  34.4   Platelets 150 - 400 K/uL 274  125  96       Latest Ref Rng & Units 01/08/2023    2:57 PM 12/01/2022   11:08 PM 11/21/2022    8:45 AM  CMP  Glucose 70 - 99 mg/dL 191  95  478   BUN  8 - 23 mg/dL 15  10  12    Creatinine 0.44 - 1.00 mg/dL 2.95  6.21  3.08   Sodium 135 - 145 mmol/L 135  135  134   Potassium 3.5 - 5.1 mmol/L 3.8  3.9  3.4   Chloride 98 - 111 mmol/L 103  101  102   CO2 22 - 32 mmol/L 23  21  23    Calcium 8.9 - 10.3 mg/dL 8.8  8.5  8.8   Total Protein 6.5 - 8.1 g/dL 6.8  6.4  6.6   Total Bilirubin 0.3 - 1.2 mg/dL 0.6  0.4  0.7   Alkaline Phos 38 - 126 U/L 57  44  53   AST 15 - 41 U/L 15  17  17    ALT 0 - 44 U/L 10  11  10       No results found for: "CEA1", "CEA" / No results found for: "CEA1", "CEA" No results found for: "PSA1" No results found for: "MVH846" No results found for: "CAN125"  No results found for: "TOTALPROTELP", "ALBUMINELP", "A1GS", "A2GS", "BETS", "BETA2SER", "GAMS", "MSPIKE", "SPEI" No results found for: "TIBC", "FERRITIN", "IRONPCTSAT" No results found for: "LDH"   STUDIES:   No results found.

## 2023-01-08 NOTE — Progress Notes (Signed)
Patients port flushed without difficulty.  Good blood return noted with no bruising or swelling noted at site.  Band aid applied.  VSS with discharge and left in satisfactory condition with no s/s of distress noted.   

## 2023-01-09 ENCOUNTER — Other Ambulatory Visit: Payer: Self-pay

## 2023-01-17 ENCOUNTER — Encounter: Payer: Self-pay | Admitting: Hematology

## 2023-01-28 DIAGNOSIS — I10 Essential (primary) hypertension: Secondary | ICD-10-CM | POA: Diagnosis not present

## 2023-01-28 DIAGNOSIS — C679 Malignant neoplasm of bladder, unspecified: Secondary | ICD-10-CM | POA: Diagnosis not present

## 2023-02-12 ENCOUNTER — Encounter: Payer: Self-pay | Admitting: Emergency Medicine

## 2023-02-12 ENCOUNTER — Ambulatory Visit
Admission: EM | Admit: 2023-02-12 | Discharge: 2023-02-12 | Disposition: A | Discharge status: Home / Self Care | Payer: Medicare Other | Attending: Nurse Practitioner | Admitting: Nurse Practitioner

## 2023-02-12 ENCOUNTER — Other Ambulatory Visit: Payer: Self-pay

## 2023-02-12 DIAGNOSIS — R399 Unspecified symptoms and signs involving the genitourinary system: Secondary | ICD-10-CM | POA: Insufficient documentation

## 2023-02-12 DIAGNOSIS — Z8551 Personal history of malignant neoplasm of bladder: Secondary | ICD-10-CM | POA: Insufficient documentation

## 2023-02-12 LAB — POCT URINALYSIS DIP (MANUAL ENTRY)
Bilirubin, UA: NEGATIVE
Blood, UA: NEGATIVE
Glucose, UA: NEGATIVE mg/dL
Ketones, POC UA: NEGATIVE mg/dL
Nitrite, UA: NEGATIVE
Protein Ur, POC: 100 mg/dL — AB
Spec Grav, UA: 1.02 (ref 1.010–1.025)
Urobilinogen, UA: 0.2 E.U./dL
pH, UA: 7 (ref 5.0–8.0)

## 2023-02-12 MED ORDER — NITROFURANTOIN MONOHYD MACRO 100 MG PO CAPS: 100.0000 mg | ORAL_CAPSULE | Freq: Two times a day (BID) | ORAL | 0 refills | Status: DC

## 2023-02-12 MED ORDER — FLUCONAZOLE 150 MG PO TABS: 150.0000 mg | ORAL_TABLET | Freq: Once | ORAL | 0 refills | Status: AC

## 2023-02-12 NOTE — ED Provider Notes (Signed)
[facility]    CSN: [id number] Arrival date & time: 02/12/23  1114      History   Chief Complaint Chief Complaint  Patient presents with   Back Pain    HPI RICKYA KETLER is a 67 y.o. female.   The history is provided by the patient.   The patient presents for complaints of urinary frequency, low back pain, and bilateral pelvic pain has been present for the past week.  Patient denies fever, chills, chest pain, nausea, vomiting, diarrhea, dysuria, decreased urine stream, hematuria, or flank pain.  Patient reports that she finished chemo and radiation for bladder cancer in June.  Past Medical History:  Diagnosis Date   AC (acromioclavicular) joint bone spurs    Anxiety    Asthma    COPD (chronic obstructive pulmonary disease) (HCC)    DDD (degenerative disc disease), lumbar    Hypertension    Neck fracture (HCC)    Panic disorder    Scoliosis     Patient Active Problem List   Diagnosis Date Noted   Malignant neoplasm of urinary bladder (HCC) 07/29/2022   PMB (postmenopausal bleeding) 02/21/2022   Routine cervical smear 02/21/2022   ANEMIA, MACROCYTIC 02/22/2009   FATIGUE 02/22/2009   WEIGHT LOSS 02/22/2009    Past Surgical History:  Procedure Laterality Date   BLADDER INSTILLATION N/A 08/29/2022   Procedure: BLADDER INSTILLATION;  Surgeon: Malen Gauze, MD;  Location: [facility];  Service: Urology;  Laterality: N/A;   CESAREAN SECTION     CYSTOSCOPY W/ RETROGRADES Bilateral 08/29/2022   Procedure: CYSTOSCOPY WITH RETROGRADE PYELOGRAM;  Surgeon: Malen Gauze, MD;  Location: [facility];  Service: Urology;  Laterality: Bilateral;   IR IMAGING GUIDED PORT INSERTION  10/17/2022   TRANSURETHRAL RESECTION OF BLADDER TUMOR N/A 08/29/2022   Procedure: TRANSURETHRAL RESECTION OF BLADDER TUMOR (TURBT);  Surgeon: Malen Gauze, MD;  Location: [facility];  Service: Urology;  Laterality: N/A;   wisdom teeth removal      OB History     Gravida  1   Para   1   Term  1   Preterm      AB      Living  1      SAB      IAB      Ectopic      Multiple      Live Births  1            Home Medications    Prior to Admission medications   Medication Sig Start Date End Date Taking? Authorizing Provider  fluconazole (DIFLUCAN) 150 MG tablet Take 1 tablet (150 mg total) by mouth once for 1 dose. 02/12/23 02/12/23 Yes -Warren, Sadie Haber, NP  nitrofurantoin, macrocrystal-monohydrate, (MACROBID) 100 MG capsule Take 1 capsule (100 mg total) by mouth 2 (two) times daily. 02/12/23  Yes -Warren, Sadie Haber, NP  acetaminophen (TYLENOL) 500 MG tablet Take 1,000 mg by mouth every 4 (four) hours as needed for mild pain or moderate pain.    [provider]  amLODipine (NORVASC) 10 MG tablet Take 10 mg by mouth at bedtime.    [provider]  dextrose 5 % SOLN 1,000 mL with fluorouracil 5 GM/100ML SOLN Inject into the vein. 10/30/22   [provider]  diazepam (VALIUM) 10 MG tablet Take 10 mg by mouth in the morning and at bedtime.    [provider]  lidocaine-prilocaine (EMLA) cream Apply to affected area once 10/22/22  Doreatha Massed, MD  losartan (COZAAR) 25 MG tablet Take 25 mg by mouth every evening. 12/14/21   [provider]  megestrol (MEGACE) 400 MG/10ML suspension Take 10 mLs (400 mg total) by mouth 2 (two) times daily. 10/30/22   Doreatha Massed, MD  MITOMYCIN IV Inject into the vein. 10/30/22   [provider]  ofloxacin (OCUFLOX) 0.3 % ophthalmic solution Place 2 drops into the right eye 4 (four) times daily. 01/03/23   [provider]  prednisoLONE acetate (PRED FORTE) 1 % ophthalmic suspension Place into the right eye 4 (four) times daily. 01/03/23   [provider]  prochlorperazine (COMPAZINE) 10 MG tablet Take 1 tablet (10 mg total) by mouth every 6 (six) hours as needed for nausea or vomiting. 10/22/22   Doreatha Massed, MD    Family  History Family History  Problem Relation Age of Onset   Aneurysm Father    Kidney failure Mother    Hypertension Sister    Thyroid disease Sister    Anxiety disorder Sister    Colitis Sister    Irritable bowel syndrome Sister     Social History Social History   Tobacco Use   Smoking status: Every Day    Current packs/day: 1.00    Average packs/day: 1 pack/day for 40.0 years (40.0 ttl pk-yrs)    Types: Cigarettes   Smokeless tobacco: Never  Vaping Use   Vaping status: Never Used  Substance Use Topics   Alcohol use: Not Currently   Drug use: Not Currently     Allergies   Penicillins, Aspirin, Demerol [meperidine hcl], and Sulfonamide derivatives   Review of Systems Review of Systems Per HPI  Physical Exam Triage Vital Signs ED Triage Vitals  Encounter Vitals Group     BP 02/12/23 1228 (!) 150/76     Systolic BP Percentile --      Diastolic BP Percentile --      Pulse Rate 02/12/23 1228 73     Resp 02/12/23 1228 20     Temp 02/12/23 1228 97.9 F (36.6 C)     Temp Source 02/12/23 1228 Oral     SpO2 02/12/23 1228 96 %     Weight --      Height --      Head Circumference --      Peak Flow --      Pain Score 02/12/23 1229 5     Pain Loc --      Pain Education --      Exclude from Growth Chart --    No data found.  Updated Vital Signs BP (!) 150/76 (BP Location: Right Arm)   Pulse 73   Temp 97.9 F (36.6 C) (Oral)   Resp 20   SpO2 96%   Visual Acuity Right Eye Distance:   Left Eye Distance:   Bilateral Distance:    Right Eye Near:   Left Eye Near:    Bilateral Near:     Physical Exam Vitals and nursing note reviewed.  Constitutional:      General: She is not in acute distress.    Appearance: Normal appearance.  HENT:     Head: Normocephalic.  Eyes:     Extraocular Movements: Extraocular movements intact.     Pupils: Pupils are equal, round, and reactive to light.  Cardiovascular:     Rate and Rhythm: Normal rate and regular rhythm.      Pulses: Normal pulses.     Heart sounds: Normal heart sounds.  Pulmonary:     Effort: Pulmonary effort is normal. No respiratory distress.     Breath sounds: Normal breath sounds. No stridor. No wheezing, rhonchi or rales.  Abdominal:     General: Bowel sounds are normal.     Palpations: Abdomen is soft.     Tenderness: There is abdominal tenderness. There is no right CVA tenderness or left CVA tenderness.  Musculoskeletal:     Cervical back: Normal range of motion.  Skin:    General: Skin is warm and dry.  Neurological:     General: No focal deficit present.     Mental Status: She is alert and oriented to person, place, and time.  Psychiatric:        Mood and Affect: Mood normal.        Behavior: Behavior normal.      UC Treatments / Results  Labs (all labs ordered are listed, but only abnormal results are displayed) Labs Reviewed  POCT URINALYSIS DIP (MANUAL ENTRY) - Abnormal; Notable for the following components:      Result Value   Protein Ur, POC =100 (*)    Leukocytes, UA Small (1+) (*)    All other components within normal limits    EKG   Radiology No results found.  Procedures Procedures (including critical care time)  Medications Ordered in UC Medications - No data to display  Initial Impression / Assessment and Plan / UC Course  I have reviewed the triage vital signs and the nursing notes.  Pertinent labs & imaging results that were available during my care of the patient were reviewed by me and considered in my medical decision making (see chart for details).  The patient is well-appearing, she is in no acute distress, vital signs are stable.  Urinalysis is suggestive of a UTI, and patient with history of bladder cancer.  Urine culture is pending.  Will treat patient empirically with Macrobid 100 mg twice daily for the next 5 days.  Patient was also prescribed fluconazole 150 mg tablet for possible yeast infection with antibiotic.  Supportive care  recommendations were provided and discussed with the patient to include increasing her fluid intake, developing a toileting schedule, and avoiding caffeine.  Patient was given strict ER follow-up precautions.  Patient was advised if the urine culture result is negative and she continues to experience symptoms, it is recommended that she follow-up with oncology for further evaluation.  Patient was in agreement with this plan of care and verbalizes understanding.  All questions were answered.  Patient stable for discharge.   Final Clinical Impressions(s) / UC Diagnoses   Final diagnoses:  UTI symptoms     Discharge Instructions      Urine culture is pending.  You will be contacted if the urine culture result is negative.  You will also be notified if the culture does show urinary tract infection and the medication needs to be changed. Take medication as prescribed. May take over-the-counter Tylenol as needed for pain, fever, or general discomfort. Make sure you are drinking at least 8-10 8 ounce glasses of water daily. Develop a schedule that will allow you to urinate every 2 hours. Avoid caffeine such as tea, soda, and coffee while symptoms persist. Go to the emergency department immediately if you experience worsening urinary symptoms along with fever, chills, or other concerns. If the result of the culture is negative, and you are continue to experience symptoms, please follow-up with your oncologist for further evaluation. Follow-up as needed.  ED Prescriptions     Medication Sig Dispense Auth. Provider   nitrofurantoin, macrocrystal-monohydrate, (MACROBID) 100 MG capsule Take 1 capsule (100 mg total) by mouth 2 (two) times daily. 10 capsule -Warren, Sadie Haber, NP   fluconazole (DIFLUCAN) 150 MG tablet Take 1 tablet (150 mg total) by mouth once for 1 dose. 1 tablet -Warren, Sadie Haber, NP      PDMP not reviewed this encounter.   Abran Cantor,  NP 02/12/23 1309

## 2023-02-12 NOTE — ED Triage Notes (Signed)
Pt reports lower back pain, bilateral pelvic pain, and urinary frequency x1 week. Pt reports finished chemo and radiation for bladder cancer in June.

## 2023-02-12 NOTE — Discharge Instructions (Addendum)
Urine culture is pending.  You will be contacted if the urine culture result is negative.  You will also be notified if the culture does show urinary tract infection and the medication needs to be changed. Take medication as prescribed. May take over-the-counter Tylenol as needed for pain, fever, or general discomfort. Make sure you are drinking at least 8-10 8 ounce glasses of water daily. Develop a schedule that will allow you to urinate every 2 hours. Avoid caffeine such as tea, soda, and coffee while symptoms persist. Go to the emergency department immediately if you experience worsening urinary symptoms along with fever, chills, or other concerns. If the result of the culture is negative, and you are continue to experience symptoms, please follow-up with your oncologist for further evaluation. Follow-up as needed.

## 2023-02-14 ENCOUNTER — Encounter: Payer: Self-pay | Admitting: Internal Medicine

## 2023-02-14 ENCOUNTER — Telehealth: Payer: Self-pay | Admitting: Internal Medicine

## 2023-02-14 MED ORDER — CIPROFLOXACIN HCL 500 MG PO TABS: 500.0000 mg | ORAL_TABLET | Freq: Two times a day (BID) | ORAL | 0 refills | Status: AC

## 2023-02-14 NOTE — Telephone Encounter (Signed)
Urine culture results show that Macrobid may or may not work for her UTI. Will send ciprofloxacin 500mg  BID X 7 days. RX sent.

## 2023-03-19 ENCOUNTER — Other Ambulatory Visit: Payer: Medicare Other | Admitting: Urology

## 2023-03-28 DIAGNOSIS — E440 Moderate protein-calorie malnutrition: Secondary | ICD-10-CM | POA: Diagnosis not present

## 2023-03-28 DIAGNOSIS — Z681 Body mass index (BMI) 19 or less, adult: Secondary | ICD-10-CM | POA: Diagnosis not present

## 2023-03-28 DIAGNOSIS — C679 Malignant neoplasm of bladder, unspecified: Secondary | ICD-10-CM | POA: Diagnosis not present

## 2023-03-28 DIAGNOSIS — I10 Essential (primary) hypertension: Secondary | ICD-10-CM | POA: Diagnosis not present

## 2023-05-05 ENCOUNTER — Ambulatory Visit (HOSPITAL_COMMUNITY): Admission: RE | Admit: 2023-05-05 | Payer: Medicare Other | Source: Ambulatory Visit

## 2023-05-07 ENCOUNTER — Other Ambulatory Visit: Payer: Medicare Other | Admitting: Urology

## 2023-05-09 ENCOUNTER — Other Ambulatory Visit: Payer: Self-pay

## 2023-05-15 ENCOUNTER — Inpatient Hospital Stay: Payer: Medicare Other | Admitting: Hematology

## 2023-05-21 ENCOUNTER — Inpatient Hospital Stay: Payer: Medicare Other | Attending: Hematology

## 2023-05-21 DIAGNOSIS — C679 Malignant neoplasm of bladder, unspecified: Secondary | ICD-10-CM | POA: Insufficient documentation

## 2023-05-21 DIAGNOSIS — Z452 Encounter for adjustment and management of vascular access device: Secondary | ICD-10-CM | POA: Insufficient documentation

## 2023-05-21 DIAGNOSIS — Z79899 Other long term (current) drug therapy: Secondary | ICD-10-CM | POA: Diagnosis not present

## 2023-05-21 LAB — CBC WITH DIFFERENTIAL/PLATELET
Abs Immature Granulocytes: 0.02 10*3/uL (ref 0.00–0.07)
Basophils Absolute: 0 10*3/uL (ref 0.0–0.1)
Basophils Relative: 0 %
Eosinophils Absolute: 0.1 10*3/uL (ref 0.0–0.5)
Eosinophils Relative: 1 %
HCT: 42.1 % (ref 36.0–46.0)
Hemoglobin: 13.8 g/dL (ref 12.0–15.0)
Immature Granulocytes: 0 %
Lymphocytes Relative: 16 %
Lymphs Abs: 1.2 10*3/uL (ref 0.7–4.0)
MCH: 31.9 pg (ref 26.0–34.0)
MCHC: 32.8 g/dL (ref 30.0–36.0)
MCV: 97.2 fL (ref 80.0–100.0)
Monocytes Absolute: 0.5 10*3/uL (ref 0.1–1.0)
Monocytes Relative: 7 %
Neutro Abs: 5.4 10*3/uL (ref 1.7–7.7)
Neutrophils Relative %: 76 %
Platelets: 229 10*3/uL (ref 150–400)
RBC: 4.33 MIL/uL (ref 3.87–5.11)
RDW: 14 % (ref 11.5–15.5)
WBC: 7.2 10*3/uL (ref 4.0–10.5)
nRBC: 0 % (ref 0.0–0.2)

## 2023-05-21 LAB — COMPREHENSIVE METABOLIC PANEL
ALT: 9 U/L (ref 0–44)
AST: 15 U/L (ref 15–41)
Albumin: 4 g/dL (ref 3.5–5.0)
Alkaline Phosphatase: 49 U/L (ref 38–126)
Anion gap: 11 (ref 5–15)
BUN: 17 mg/dL (ref 8–23)
CO2: 22 mmol/L (ref 22–32)
Calcium: 8.9 mg/dL (ref 8.9–10.3)
Chloride: 103 mmol/L (ref 98–111)
Creatinine, Ser: 0.95 mg/dL (ref 0.44–1.00)
GFR, Estimated: >60 mL/min (ref >60)
Glucose, Bld: 93 mg/dL (ref 70–99)
Potassium: 4.1 mmol/L (ref 3.5–5.1)
Sodium: 136 mmol/L (ref 135–145)
Total Bilirubin: 0.6 mg/dL (ref <1.2)
Total Protein: 7 g/dL (ref 6.5–8.1)

## 2023-05-21 MED ORDER — HEPARIN SOD (PORK) LOCK FLUSH 100 UNIT/ML IV SOLN
500.0000 [IU] | Freq: Once | INTRAVENOUS | Status: AC
  Administered 2023-05-21: 500 [IU] via INTRAVENOUS

## 2023-05-21 MED ORDER — SODIUM CHLORIDE 0.9% FLUSH
10.0000 mL | INTRAVENOUS | Status: AC
  Administered 2023-05-21: 10 mL

## 2023-05-21 NOTE — Progress Notes (Signed)
Raven Lopez presented for Portacath access and flush.  Portacath located right chest wall accessed with  H 20 needle.  Good blood return present. Portacath flushed with 20ml NS and 500U/62ml Heparin and needle removed intact.  Procedure tolerated well and without incident. Treatment given today per MD orders. Discharged from clinic ambulatory in stable condition. Alert and oriented x 3. F/U with [facility] as scheduled.

## 2023-05-22 ENCOUNTER — Ambulatory Visit (HOSPITAL_COMMUNITY): Admission: RE | Admit: 2023-05-22 | Payer: Medicare Other | Source: Ambulatory Visit

## 2023-06-10 ENCOUNTER — Other Ambulatory Visit: Payer: Self-pay

## 2023-06-13 ENCOUNTER — Ambulatory Visit (HOSPITAL_COMMUNITY): Payer: Medicare Other

## 2023-06-23 ENCOUNTER — Ambulatory Visit (HOSPITAL_COMMUNITY)
Admission: RE | Admit: 2023-06-23 | Discharge: 2023-06-23 | Disposition: A | Discharge status: Home / Self Care | Payer: Medicare Other | Source: Ambulatory Visit | Attending: Hematology | Admitting: Hematology

## 2023-06-23 DIAGNOSIS — C679 Malignant neoplasm of bladder, unspecified: Secondary | ICD-10-CM | POA: Diagnosis not present

## 2023-06-23 DIAGNOSIS — R911 Solitary pulmonary nodule: Secondary | ICD-10-CM | POA: Diagnosis not present

## 2023-06-23 DIAGNOSIS — K8689 Other specified diseases of pancreas: Secondary | ICD-10-CM | POA: Diagnosis not present

## 2023-06-23 DIAGNOSIS — K838 Other specified diseases of biliary tract: Secondary | ICD-10-CM | POA: Diagnosis not present

## 2023-06-26 DIAGNOSIS — Z0001 Encounter for general adult medical examination with abnormal findings: Secondary | ICD-10-CM | POA: Diagnosis not present

## 2023-06-26 DIAGNOSIS — E440 Moderate protein-calorie malnutrition: Secondary | ICD-10-CM | POA: Diagnosis not present

## 2023-06-26 DIAGNOSIS — C679 Malignant neoplasm of bladder, unspecified: Secondary | ICD-10-CM | POA: Diagnosis not present

## 2023-06-26 DIAGNOSIS — I10 Essential (primary) hypertension: Secondary | ICD-10-CM | POA: Diagnosis not present

## 2023-06-26 DIAGNOSIS — Z681 Body mass index (BMI) 19 or less, adult: Secondary | ICD-10-CM | POA: Diagnosis not present

## 2023-07-08 ENCOUNTER — Inpatient Hospital Stay: Payer: Medicare Other | Admitting: Hematology

## 2023-07-15 ENCOUNTER — Other Ambulatory Visit: Payer: Self-pay

## 2023-07-15 ENCOUNTER — Inpatient Hospital Stay: Payer: Medicare Other | Admitting: Hematology

## 2023-07-21 ENCOUNTER — Other Ambulatory Visit: Payer: Medicare Other | Admitting: Urology

## 2023-07-21 ENCOUNTER — Ambulatory Visit: Payer: Medicare Other | Admitting: Urology

## 2023-07-21 VITALS — BP 114/81 | HR 76

## 2023-07-21 DIAGNOSIS — N898 Other specified noninflammatory disorders of vagina: Secondary | ICD-10-CM | POA: Diagnosis not present

## 2023-07-21 DIAGNOSIS — C679 Malignant neoplasm of bladder, unspecified: Secondary | ICD-10-CM | POA: Diagnosis not present

## 2023-07-21 DIAGNOSIS — R102 Pelvic and perineal pain: Secondary | ICD-10-CM

## 2023-07-21 MED ORDER — CIPROFLOXACIN HCL 500 MG PO TABS
500.0000 mg | ORAL_TABLET | Freq: Once | ORAL | Status: DC
Start: 2023-07-21 — End: 2023-07-21

## 2023-07-21 MED ORDER — FLUCONAZOLE 150 MG PO TABS
150.0000 mg | ORAL_TABLET | Freq: Every day | ORAL | 0 refills | Status: DC
Start: 2023-07-21 — End: 2024-03-24

## 2023-07-22 LAB — URINALYSIS, ROUTINE W REFLEX MICROSCOPIC
Bilirubin, UA: NEGATIVE
Glucose, UA: NEGATIVE
Ketones, UA: NEGATIVE
Leukocytes,UA: NEGATIVE
Nitrite, UA: NEGATIVE
Specific Gravity, UA: 1.015 (ref 1.005–1.030)
Urobilinogen, Ur: 0.2 mg/dL (ref 0.2–1.0)
pH, UA: 6.5 (ref 5.0–7.5)

## 2023-07-22 LAB — MICROSCOPIC EXAMINATION: Bacteria, UA: NONE SEEN

## 2023-07-24 ENCOUNTER — Encounter: Payer: Self-pay | Admitting: Urology

## 2023-07-24 NOTE — Progress Notes (Signed)
07/21/2023 9:03 PM   Raven Lopez [birth year] [id number]  Referring provider: Elfredia Nevins, MD [address] [place],  [place] [address]  Pelvic pain   HPI: [name] is a [phone] here for followup for bladder cancer. She is scheduled for cystoscopy today but for the past 2 weeks she has had vaginal pain and irriation. She has been trying OTC therapies which failed to improve the vaginal irritation.    PMH: Past Medical History:  Diagnosis Date   AC (acromioclavicular) joint bone spurs    Anxiety    Asthma    COPD (chronic obstructive pulmonary disease) (HCC)    DDD (degenerative disc disease), lumbar    Hypertension    Neck fracture (HCC)    Panic disorder    Scoliosis     Surgical History: Past Surgical History:  Procedure Laterality Date   BLADDER INSTILLATION N/A 08/29/2022   Procedure: BLADDER INSTILLATION;  Surgeon: Malen Gauze, MD;  Location: [facility];  Service: Urology;  Laterality: N/A;   CESAREAN SECTION     CYSTOSCOPY W/ RETROGRADES Bilateral 08/29/2022   Procedure: CYSTOSCOPY WITH RETROGRADE PYELOGRAM;  Surgeon: Malen Gauze, MD;  Location: [facility];  Service: Urology;  Laterality: Bilateral;   IR IMAGING GUIDED PORT INSERTION  10/17/2022   TRANSURETHRAL RESECTION OF BLADDER TUMOR N/A 08/29/2022   Procedure: TRANSURETHRAL RESECTION OF BLADDER TUMOR (TURBT);  Surgeon: Malen Gauze, MD;  Location: [facility];  Service: Urology;  Laterality: N/A;   wisdom teeth removal      Home Medications:  Allergies as of 07/21/2023       Reactions   Penicillins Shortness Of Breath, Swelling   Has patient had a PCN reaction causing immediate rash, facial/tongue/throat swelling, SOB or lightheadedness with hypotension: Yes Has patient had a PCN reaction causing severe rash involving mucus membranes or skin necrosis: No Has patient had a PCN reaction that required hospitalization: Yes Has patient had a PCN reaction occurring within the last 10 years: No If  all of the above answers are "NO", then may proceed with Cephalosporin use.   Aspirin    REACTION: dizzy,weak and upset stomach   Demerol [meperidine Hcl] Other (See Comments)   Altered mental status-anger, hallucinations   Sulfonamide Derivatives Nausea And Vomiting        Medication List        Accurate as of July 21, 2023 11:59 PM. If you have any questions, ask your nurse or doctor.          acetaminophen 500 MG tablet Commonly known as: TYLENOL Take 1,000 mg by mouth every 4 (four) hours as needed for mild pain or moderate pain.   amLODipine 10 MG tablet Commonly known as: NORVASC Take 10 mg by mouth at bedtime.   dextrose 5 % SOLN 1,000 mL with fluorouracil 5 GM/100ML SOLN Inject into the vein.   diazepam 10 MG tablet Commonly known as: VALIUM Take 10 mg by mouth in the morning and at bedtime.   fluconazole 150 MG tablet Commonly known as: DIFLUCAN Take 1 tablet (150 mg total) by mouth daily. Started by: Wilkie Aye   lidocaine-prilocaine cream Commonly known as: EMLA Apply to affected area once   losartan 25 MG tablet Commonly known as: COZAAR Take 25 mg by mouth every evening.   megestrol 400 MG/10ML suspension Commonly known as: MEGACE Take 10 mLs (400 mg total) by mouth 2 (two) times daily.   MITOMYCIN IV Inject into the vein.   nitrofurantoin (macrocrystal-monohydrate) 100  MG capsule Commonly known as: MACROBID Take 1 capsule (100 mg total) by mouth 2 (two) times daily.   ofloxacin 0.3 % ophthalmic solution Commonly known as: OCUFLOX Place 2 drops into the right eye 4 (four) times daily.   prednisoLONE acetate 1 % ophthalmic suspension Commonly known as: PRED FORTE Place into the right eye 4 (four) times daily.   prochlorperazine 10 MG tablet Commonly known as: COMPAZINE Take 1 tablet (10 mg total) by mouth every 6 (six) hours as needed for nausea or vomiting.        Allergies:  Allergies  Allergen Reactions    Penicillins Shortness Of Breath and Swelling    Has patient had a PCN reaction causing immediate rash, facial/tongue/throat swelling, SOB or lightheadedness with hypotension: Yes Has patient had a PCN reaction causing severe rash involving mucus membranes or skin necrosis: No Has patient had a PCN reaction that required hospitalization: Yes Has patient had a PCN reaction occurring within the last 10 years: No If all of the above answers are "NO", then may proceed with Cephalosporin use.    Aspirin     REACTION: dizzy,weak and upset stomach   Demerol [Meperidine Hcl] Other (See Comments)    Altered mental status-anger, hallucinations   Sulfonamide Derivatives Nausea And Vomiting    Family History: Family History  Problem Relation Age of Onset   Aneurysm Father    Kidney failure Mother    Hypertension Sister    Thyroid disease Sister    Anxiety disorder Sister    Colitis Sister    Irritable bowel syndrome Sister     Social History:  reports that she has been smoking cigarettes. She has a 40 pack-year smoking history. She has never used smokeless tobacco. She reports that she does not currently use alcohol. She reports that she does not currently use drugs.  ROS: All other review of systems were reviewed and are negative except what is noted above in HPI  Physical Exam: BP 114/81   Pulse 76   Constitutional:  Alert and oriented, No acute distress. HEENT: [place] AT, moist mucus membranes.  Trachea midline, no masses. Cardiovascular: No clubbing, cyanosis, or edema. Respiratory: Normal respiratory effort, no increased work of breathing. GI: Abdomen is soft, nontender, nondistended, no abdominal masses GU: No CVA tenderness.  Lymph: No cervical or inguinal lymphadenopathy. Skin: No rashes, bruises or suspicious lesions. Neurologic: Grossly intact, no focal deficits, moving all 4 extremities. Psychiatric: Normal mood and affect.  Laboratory Data: Lab Results  Component Value Date    WBC 7.2 05/21/2023   HGB 13.8 05/21/2023   HCT 42.1 05/21/2023   MCV 97.2 05/21/2023   PLT 229 05/21/2023    Lab Results  Component Value Date   CREATININE 0.95 05/21/2023    No results found for: "PSA"  No results found for: "TESTOSTERONE"  No results found for: "HGBA1C"  Urinalysis    Component Value Date/Time   COLORURINE STRAW (A) 12/02/2022 0053   APPEARANCEUR Clear 07/21/2023 1539   LABSPEC 1.006 12/02/2022 0053   PHURINE 7.0 12/02/2022 0053   GLUCOSEU Negative 07/21/2023 1539   HGBUR NEGATIVE 12/02/2022 0053   BILIRUBINUR Negative 07/21/2023 1539   KETONESUR negative 02/12/2023 1235   KETONESUR 5 (A) 12/02/2022 0053   PROTEINUR 2+ (A) 07/21/2023 1539   PROTEINUR 30 (A) 12/02/2022 0053   UROBILINOGEN 0.2 02/12/2023 1235   UROBILINOGEN 0.2 04/05/2010 0558   NITRITE Negative 07/21/2023 1539   NITRITE NEGATIVE 12/02/2022 0053   LEUKOCYTESUR Negative 07/21/2023 1539  LEUKOCYTESUR NEGATIVE 12/02/2022 0053    Lab Results  Component Value Date   LABMICR See below: 07/21/2023   WBCUA 0-5 07/21/2023   LABEPIT 0-10 07/21/2023   BACTERIA None seen 07/21/2023    Pertinent Imaging:  No results found for this or any previous visit.  No results found for this or any previous visit.  No results found for this or any previous visit.  No results found for this or any previous visit.  No results found for this or any previous visit.  No results found for this or any previous visit.  No results found for this or any previous visit.  No results found for this or any previous visit.   Assessment & Plan:    1. Malignant neoplasm of urinary bladder, unspecified site (HCC) (Primary) Reschedule for cystosocpy. Difulcan 150mg  daily x 2 days sent to pharmacy - Urinalysis, Routine w reflex microscopic - Cystoscopy (Bedside)   No follow-ups on file.  Wilkie Aye, MD  [facility] [place]

## 2023-07-25 ENCOUNTER — Ambulatory Visit: Payer: Medicare Other | Admitting: Urology

## 2023-07-25 VITALS — BP 125/73 | HR 71

## 2023-07-25 DIAGNOSIS — C679 Malignant neoplasm of bladder, unspecified: Secondary | ICD-10-CM | POA: Diagnosis not present

## 2023-07-25 DIAGNOSIS — Z8551 Personal history of malignant neoplasm of bladder: Secondary | ICD-10-CM | POA: Diagnosis not present

## 2023-07-25 LAB — URINALYSIS, ROUTINE W REFLEX MICROSCOPIC
Bilirubin, UA: NEGATIVE
Glucose, UA: NEGATIVE
Ketones, UA: NEGATIVE
Leukocytes,UA: NEGATIVE
Nitrite, UA: NEGATIVE
RBC, UA: NEGATIVE
Specific Gravity, UA: 1.015 (ref 1.005–1.030)
Urobilinogen, Ur: 0.2 mg/dL (ref 0.2–1.0)
pH, UA: 6 (ref 5.0–7.5)

## 2023-07-25 MED ORDER — CIPROFLOXACIN HCL 500 MG PO TABS
500.0000 mg | ORAL_TABLET | Freq: Once | ORAL | Status: AC
  Administered 2023-07-25: 500 mg via ORAL

## 2023-07-25 NOTE — Progress Notes (Signed)
   07/25/23  CC: followup bladder cancer   HPI: [name] is a 68yo here for cystoscopoy for bladder cancer Blood pressure (!) 87/60, pulse 79. NED. A&Ox3.   No respiratory distress   Abd soft, NT, ND Normal external genitalia with patent urethral meatus  Cystoscopy Procedure Note  Patient identification was confirmed, informed consent was obtained, and patient was prepped using Betadine solution.  Lidocaine jelly was administered per urethral meatus.    Procedure: - Flexible cystoscope introduced, without any difficulty.   - Thorough search of the bladder revealed:    normal urethral meatus    normal urothelium    no stones    no ulcers     no tumors    no urethral polyps    no trabeculation  - Ureteral orifices were normal in position and appearance.  Post-Procedure: - Patient tolerated the procedure well  Assessment/ Plan: Followup 3 months for cystoscopy   No follow-ups on file.  Wilkie Aye, MD

## 2023-07-27 ENCOUNTER — Other Ambulatory Visit: Payer: Self-pay

## 2023-07-29 ENCOUNTER — Inpatient Hospital Stay: Payer: Medicare Other | Admitting: Hematology

## 2023-07-29 NOTE — Progress Notes (Incomplete)
[facility] [address] [place] [address]    Clinic Day:  07/29/2023  Referring physician: Elfredia Nevins, MD  Patient Care Team: Elfredia Nevins, MD as PCP - General (Internal Medicine) Ronne Binning Mardene Celeste, MD as Consulting Physician (Urology) Nils Pyle, MD as Consulting Physician (Radiation Oncology) Doreatha Massed, MD as Consulting Physician (Hematology)   ASSESSMENT & PLAN:   Assessment: 1.  Stage II (T2 N0) high-grade urothelial carcinoma of the bladder: - Presented with intermittent gross hematuria ongoing since 03/2022.  CT [facility] on 06/19/2022: 4.4 cm in the roof of the bladder. - TURBT by Dr. Ronne Binning (08/29/2022): Infiltrating high-grade urothelial carcinoma, invading muscularis propria (detrusor muscle). - CT CAP (09/26/2022): Interval resection of the mass of the superior right aspect of the urinary bladder with residual posterior bladder dome and right eccentric hyperenhancing wall thickening measuring up to 1.8 cm.  No adenopathy or metastatic disease. - Patient does not want to have radical cystectomy.  She met with Dr. Langston Masker at [facility] and want to proceed with trimodality therapy. -- 5-FU plus mitomycin and XRT from 10/30/2022 through 12/01/2022.   2.  Social/family history: - She lives by herself and is independent of ADLs and IADLs.  She worked as a Data processing manager prior to retirement.  Current active smoker, 1 pack/day for the last 50 years, started at age 64. - Paternal grandfather had throat cancer.  Paternal grandmother had ovarian cancer.    Plan: 1.  Stage II (T2 N0) high-grade urothelial carcinoma of the bladder: - She has completed XRT on 12/01/2022. - Reviewed CTAP without contrast from 12/02/2022: Thickening of the walls of sigmoid colon with surrounding colitis.  Irregular lobular mass in the superior aspect of the urinary bladder on the right, not significantly changed from prior exam. - Labs today: Normal LFTs.   Creatinine normal.  CBC grossly normal. - Recommend follow-up in 4 months with repeat CT CAP without contrast.       No orders of the defined types were placed in this encounter.    Raven Lopez,acting as a Neurosurgeon for Doreatha Massed, MD.,have documented all relevant documentation on the behalf of Doreatha Massed, MD,as directed by  Doreatha Massed, MD while in the presence of Doreatha Massed, MD.  ***    [name]   1/21/20258:21 AM  CHIEF COMPLAINT:   Diagnosis: muscle invasive bladder cancer    Cancer Staging  Malignant neoplasm of urinary bladder [facility]) Staging form: Urinary Bladder, AJCC 8th Edition - Clinical stage from 10/09/2022: Stage II (cT2, cN0, cM0) - Unsigned    Prior Therapy: TURBT with gemcitabine instillation 08/29/22   Current Therapy:  Concurrent chemoradiation therapy with 5FU and [facility], started on 10/30/2022.   HISTORY OF PRESENT ILLNESS:   Oncology History  Malignant neoplasm of urinary bladder (HCC)  07/29/2022 Initial Diagnosis   Malignant neoplasm of urinary bladder   10/30/2022 -  Chemotherapy   Patient is on Treatment Plan : BLADDER Mitomycin D1 + 5FU D1-5, 21-25 + XRT        INTERVAL HISTORY:   Raven Lopez is a 68 y.o. female presenting to clinic today for follow up of muscle invasive bladder cancer. She was last seen by me on 01/08/23.  Since her last visit, she underwent CT C/A/P on 06/23/23 that found: questionable persistent but decreased asymmetric wall thickening along the right side of the urinary bladder, which may reflect posttreatment change or residual neoplastic process; new round soft tissue density along the  left external iliac artery measuring 17 mm appears to be in continuity with a broad ligament and likely reflects the left ovary over a left external iliac lymph node, but is difficult to definitively localize given lack of intravenous and enteric contrast material; irregular 5 mm pulmonary nodule in the medial  aspect of the left upper lobe, nonspecific; and similar common bile duct dilation measuring 12 mm and prominence of the pancreatic duct measuring 4 mm.  She had bedside cytoscopy with Dr. Ronne Binning on 07/21/23 and 07/25/23.   Today, she states that she is doing well overall. Her appetite level is at ***%. Her energy level is at ***%. She is accompanied by family.   PAST MEDICAL HISTORY:   Past Medical History: Past Medical History:  Diagnosis Date   AC (acromioclavicular) joint bone spurs    Anxiety    Asthma    COPD (chronic obstructive pulmonary disease) (HCC)    DDD (degenerative disc disease), lumbar    Hypertension    Neck fracture (HCC)    Panic disorder    Scoliosis     Surgical History: Past Surgical History:  Procedure Laterality Date   BLADDER INSTILLATION N/A 08/29/2022   Procedure: BLADDER INSTILLATION;  Surgeon: Malen Gauze, MD;  Location: [facility];  Service: Urology;  Laterality: N/A;   CESAREAN SECTION     CYSTOSCOPY W/ RETROGRADES Bilateral 08/29/2022   Procedure: CYSTOSCOPY WITH RETROGRADE PYELOGRAM;  Surgeon: Malen Gauze, MD;  Location: [facility];  Service: Urology;  Laterality: Bilateral;   IR IMAGING GUIDED PORT INSERTION  10/17/2022   TRANSURETHRAL RESECTION OF BLADDER TUMOR N/A 08/29/2022   Procedure: TRANSURETHRAL RESECTION OF BLADDER TUMOR (TURBT);  Surgeon: Malen Gauze, MD;  Location: [facility];  Service: Urology;  Laterality: N/A;   wisdom teeth removal      Social History: Social History   Socioeconomic History   Marital status: Widowed    Spouse name: Not on file   Number of children: Not on file   Years of education: Not on file   Highest education level: Not on file  Occupational History   Not on file  Tobacco Use   Smoking status: Every Day    Current packs/day: 1.00    Average packs/day: 1 pack/day for 40.0 years (40.0 ttl pk-yrs)    Types: Cigarettes   Smokeless tobacco: Never  Vaping Use   Vaping status: Never Used   Substance and Sexual Activity   Alcohol use: Not Currently   Drug use: Not Currently   Sexual activity: Not Currently    Birth control/protection: Post-menopausal  Other Topics Concern   Not on file  Social History Narrative   Not on file   Social Drivers of Health   Financial Resource Strain: Low Risk  (02/21/2022)   Overall Financial Resource Strain (CARDIA)    Difficulty of Paying Living Expenses: Not hard at all  Food Insecurity: No Food Insecurity (09/12/2022)   Hunger Vital Sign    Worried About Running Out of Food in the Last Year: Never true    Ran Out of Food in the Last Year: Never true  Transportation Needs: No Transportation Needs (09/12/2022)   PRAPARE - Administrator, Civil Service (Medical): No    Lack of Transportation (Non-Medical): No  Physical Activity: Inactive (02/21/2022)   Exercise Vital Sign    Days of Exercise per Week: 0 days    Minutes of Exercise per Session: 10 min  Stress: Stress Concern Present (02/21/2022)  [employer] of Occupational Health - Occupational Stress Questionnaire    Feeling of Stress : To some extent  Social Connections: Socially Isolated (02/21/2022)   Social Connection and Isolation Panel [NHANES]    Frequency of Communication with Friends and Family: More than three times a week    Frequency of Social Gatherings with Friends and Family: Twice a week    Attends Religious Services: Never    Database administrator or Organizations: No    Attends Banker Meetings: Never    Marital Status: Widowed  Intimate Partner Violence: Not At Risk (09/12/2022)   Humiliation, Afraid, Rape, and Kick questionnaire    Fear of Current or Ex-Partner: No    Emotionally Abused: No    Physically Abused: No    Sexually Abused: No    Family History: Family History  Problem Relation Age of Onset   Aneurysm Father    Kidney failure Mother    Hypertension Sister    Thyroid disease Sister    Anxiety disorder Sister     Colitis Sister    Irritable bowel syndrome Sister     Current Medications:  Current Outpatient Medications:    acetaminophen (TYLENOL) 500 MG tablet, Take 1,000 mg by mouth every 4 (four) hours as needed for mild pain or moderate pain., Disp: , Rfl:    amLODipine (NORVASC) 10 MG tablet, Take 10 mg by mouth at bedtime., Disp: , Rfl:    dextrose 5 % SOLN 1,000 mL with fluorouracil 5 GM/100ML SOLN, Inject into the vein., Disp: , Rfl:    diazepam (VALIUM) 10 MG tablet, Take 10 mg by mouth in the morning and at bedtime., Disp: , Rfl:    fluconazole (DIFLUCAN) 150 MG tablet, Take 1 tablet (150 mg total) by mouth daily., Disp: 2 tablet, Rfl: 0   lidocaine-prilocaine (EMLA) cream, Apply to affected area once, Disp: 30 g, Rfl: 3   losartan (COZAAR) 25 MG tablet, Take 25 mg by mouth every evening., Disp: , Rfl:    megestrol (MEGACE) 400 MG/10ML suspension, Take 10 mLs (400 mg total) by mouth 2 (two) times daily., Disp: 480 mL, Rfl: 2   MITOMYCIN IV, Inject into the vein., Disp: , Rfl:    nitrofurantoin, macrocrystal-monohydrate, (MACROBID) 100 MG capsule, Take 1 capsule (100 mg total) by mouth 2 (two) times daily., Disp: 10 capsule, Rfl: 0   ofloxacin (OCUFLOX) 0.3 % ophthalmic solution, Place 2 drops into the right eye 4 (four) times daily., Disp: , Rfl:    prednisoLONE acetate (PRED FORTE) 1 % ophthalmic suspension, Place into the right eye 4 (four) times daily., Disp: , Rfl:    prochlorperazine (COMPAZINE) 10 MG tablet, Take 1 tablet (10 mg total) by mouth every 6 (six) hours as needed for nausea or vomiting., Disp: 30 tablet, Rfl: 1   Allergies: Allergies  Allergen Reactions   Penicillins Shortness Of Breath and Swelling    Has patient had a PCN reaction causing immediate rash, facial/tongue/throat swelling, SOB or lightheadedness with hypotension: Yes Has patient had a PCN reaction causing severe rash involving mucus membranes or skin necrosis: No Has patient had a PCN reaction that required  hospitalization: Yes Has patient had a PCN reaction occurring within the last 10 years: No If all of the above answers are "NO", then may proceed with Cephalosporin use.    Aspirin     REACTION: dizzy,weak and upset stomach   Demerol [Meperidine Hcl] Other (See Comments)    Altered mental status-anger, hallucinations  Sulfonamide Derivatives Nausea And Vomiting    REVIEW OF SYSTEMS:   Review of Systems  Constitutional:  Negative for chills, fatigue and fever.  HENT:   Negative for lump/mass, mouth sores, nosebleeds, sore throat and trouble swallowing.   Eyes:  Negative for eye problems.  Respiratory:  Negative for cough and shortness of breath.   Cardiovascular:  Negative for chest pain, leg swelling and palpitations.  Gastrointestinal:  Negative for abdominal pain, constipation, diarrhea, nausea and vomiting.  Genitourinary:  Negative for bladder incontinence, difficulty urinating, dysuria, frequency, hematuria and nocturia.   Musculoskeletal:  Negative for arthralgias, back pain, flank pain, myalgias and neck pain.  Skin:  Negative for itching and rash.  Neurological:  Negative for dizziness, headaches and numbness.  Hematological:  Does not bruise/bleed easily.  Psychiatric/Behavioral:  Negative for depression, sleep disturbance and suicidal ideas. The patient is not nervous/anxious.   All other systems reviewed and are negative.    VITALS:   There were no vitals taken for this visit.  Wt Readings from Last 3 Encounters:  01/08/23 79 lb 12.8 oz (36.2 kg)  12/01/22 82 lb (37.2 kg)  11/21/22 83 lb 8 oz (37.9 kg)    There is no height or weight on file to calculate BMI.  Performance status (ECOG): 1 - Symptomatic but completely ambulatory  PHYSICAL EXAM:   Physical Exam Vitals and nursing note reviewed. Exam conducted with a chaperone present.  Constitutional:      Appearance: Normal appearance.  Cardiovascular:     Rate and Rhythm: Normal rate and regular rhythm.      Pulses: Normal pulses.     Heart sounds: Normal heart sounds.  Pulmonary:     Effort: Pulmonary effort is normal.     Breath sounds: Normal breath sounds.  Abdominal:     Palpations: Abdomen is soft. There is no hepatomegaly, splenomegaly or mass.     Tenderness: There is no abdominal tenderness.  Musculoskeletal:     Right lower leg: No edema.     Left lower leg: No edema.  Lymphadenopathy:     Cervical: No cervical adenopathy.     Right cervical: No superficial, deep or posterior cervical adenopathy.    Left cervical: No superficial, deep or posterior cervical adenopathy.     Upper Body:     Right upper body: No supraclavicular or axillary adenopathy.     Left upper body: No supraclavicular or axillary adenopathy.  Neurological:     General: No focal deficit present.     Mental Status: She is alert and oriented to person, place, and time.  Psychiatric:        Mood and Affect: Mood normal.        Behavior: Behavior normal.     LABS:      Latest Ref Rng & Units 05/21/2023    3:29 PM 01/08/2023    2:57 PM 12/01/2022   11:08 PM  CBC  WBC 4.0 - 10.5 K/uL 7.2  7.4  5.1   Hemoglobin 12.0 - 15.0 g/dL 16.1  09.6  04.5   Hematocrit 36.0 - 46.0 % 42.1  37.2  35.0   Platelets 150 - 400 K/uL 229  274  125       Latest Ref Rng & Units 05/21/2023    3:29 PM 01/08/2023    2:57 PM 12/01/2022   11:08 PM  CMP  Glucose 70 - 99 mg/dL 93  409  95   BUN 8 - 23 mg/dL 17  15  10   Creatinine 0.44 - 1.00 mg/dL 1.30  8.65  7.84   Sodium 135 - 145 mmol/L 136  135  135   Potassium 3.5 - 5.1 mmol/L 4.1  3.8  3.9   Chloride 98 - 111 mmol/L 103  103  101   CO2 22 - 32 mmol/L 22  23  21    Calcium 8.9 - 10.3 mg/dL 8.9  8.8  8.5   Total Protein 6.5 - 8.1 g/dL 7.0  6.8  6.4   Total Bilirubin <1.2 mg/dL 0.6  0.6  0.4   Alkaline Phos 38 - 126 U/L 49  57  44   AST 15 - 41 U/L 15  15  17    ALT 0 - 44 U/L 9  10  11       No results found for: "CEA1", "CEA" / No results found for: "CEA1", "CEA" No  results found for: "PSA1" No results found for: "ONG295" No results found for: "CAN125"  No results found for: "TOTALPROTELP", "ALBUMINELP", "A1GS", "A2GS", "BETS", "BETA2SER", "GAMS", "MSPIKE", "SPEI" No results found for: "TIBC", "FERRITIN", "IRONPCTSAT" No results found for: "LDH"   STUDIES:   No results found.

## 2023-08-05 ENCOUNTER — Encounter: Payer: Self-pay | Admitting: Urology

## 2023-08-05 ENCOUNTER — Inpatient Hospital Stay: Payer: Medicare Other | Attending: Hematology | Admitting: Hematology

## 2023-08-05 ENCOUNTER — Inpatient Hospital Stay: Payer: Medicare Other

## 2023-08-05 VITALS — BP 104/73 | HR 71 | Temp 97.6°F | Resp 16 | Wt 80.7 lb

## 2023-08-05 DIAGNOSIS — R5383 Other fatigue: Secondary | ICD-10-CM | POA: Diagnosis not present

## 2023-08-05 DIAGNOSIS — F17210 Nicotine dependence, cigarettes, uncomplicated: Secondary | ICD-10-CM | POA: Diagnosis not present

## 2023-08-05 DIAGNOSIS — Z923 Personal history of irradiation: Secondary | ICD-10-CM | POA: Insufficient documentation

## 2023-08-05 DIAGNOSIS — Z8349 Family history of other endocrine, nutritional and metabolic diseases: Secondary | ICD-10-CM | POA: Insufficient documentation

## 2023-08-05 DIAGNOSIS — Z8249 Family history of ischemic heart disease and other diseases of the circulatory system: Secondary | ICD-10-CM | POA: Insufficient documentation

## 2023-08-05 DIAGNOSIS — Z8379 Family history of other diseases of the digestive system: Secondary | ICD-10-CM | POA: Insufficient documentation

## 2023-08-05 DIAGNOSIS — R310 Gross hematuria: Secondary | ICD-10-CM | POA: Insufficient documentation

## 2023-08-05 DIAGNOSIS — C679 Malignant neoplasm of bladder, unspecified: Secondary | ICD-10-CM | POA: Insufficient documentation

## 2023-08-05 DIAGNOSIS — Z8041 Family history of malignant neoplasm of ovary: Secondary | ICD-10-CM | POA: Insufficient documentation

## 2023-08-05 DIAGNOSIS — Z880 Allergy status to penicillin: Secondary | ICD-10-CM | POA: Diagnosis not present

## 2023-08-05 DIAGNOSIS — Z841 Family history of disorders of kidney and ureter: Secondary | ICD-10-CM | POA: Diagnosis not present

## 2023-08-05 DIAGNOSIS — Z95828 Presence of other vascular implants and grafts: Secondary | ICD-10-CM

## 2023-08-05 DIAGNOSIS — Z882 Allergy status to sulfonamides status: Secondary | ICD-10-CM | POA: Insufficient documentation

## 2023-08-05 DIAGNOSIS — Z886 Allergy status to analgesic agent status: Secondary | ICD-10-CM | POA: Insufficient documentation

## 2023-08-05 DIAGNOSIS — R911 Solitary pulmonary nodule: Secondary | ICD-10-CM | POA: Diagnosis not present

## 2023-08-05 DIAGNOSIS — Z885 Allergy status to narcotic agent status: Secondary | ICD-10-CM | POA: Diagnosis not present

## 2023-08-05 DIAGNOSIS — R0602 Shortness of breath: Secondary | ICD-10-CM | POA: Diagnosis not present

## 2023-08-05 DIAGNOSIS — Z79899 Other long term (current) drug therapy: Secondary | ICD-10-CM | POA: Insufficient documentation

## 2023-08-05 DIAGNOSIS — F419 Anxiety disorder, unspecified: Secondary | ICD-10-CM | POA: Insufficient documentation

## 2023-08-05 DIAGNOSIS — E559 Vitamin D deficiency, unspecified: Secondary | ICD-10-CM | POA: Insufficient documentation

## 2023-08-05 DIAGNOSIS — Z818 Family history of other mental and behavioral disorders: Secondary | ICD-10-CM | POA: Insufficient documentation

## 2023-08-05 LAB — COMPREHENSIVE METABOLIC PANEL
ALT: 8 U/L (ref 0–44)
AST: 14 U/L — ABNORMAL LOW (ref 15–41)
Albumin: 4 g/dL (ref 3.5–5.0)
Alkaline Phosphatase: 54 U/L (ref 38–126)
Anion gap: 8 (ref 5–15)
BUN: 21 mg/dL (ref 8–23)
CO2: 25 mmol/L (ref 22–32)
Calcium: 9.4 mg/dL (ref 8.9–10.3)
Chloride: 103 mmol/L (ref 98–111)
Creatinine, Ser: 0.98 mg/dL (ref 0.44–1.00)
GFR, Estimated: >60 mL/min (ref >60)
Glucose, Bld: 89 mg/dL (ref 70–99)
Potassium: 4.2 mmol/L (ref 3.5–5.1)
Sodium: 136 mmol/L (ref 135–145)
Total Bilirubin: 0.5 mg/dL (ref 0.0–1.2)
Total Protein: 7.5 g/dL (ref 6.5–8.1)

## 2023-08-05 LAB — CBC WITH DIFFERENTIAL/PLATELET
Abs Immature Granulocytes: 0.01 10*3/uL (ref 0.00–0.07)
Basophils Absolute: 0 10*3/uL (ref 0.0–0.1)
Basophils Relative: 1 %
Eosinophils Absolute: 0.1 10*3/uL (ref 0.0–0.5)
Eosinophils Relative: 1 %
HCT: 40.7 % (ref 36.0–46.0)
Hemoglobin: 13.3 g/dL (ref 12.0–15.0)
Immature Granulocytes: 0 %
Lymphocytes Relative: 15 %
Lymphs Abs: 1 10*3/uL (ref 0.7–4.0)
MCH: 31.9 pg (ref 26.0–34.0)
MCHC: 32.7 g/dL (ref 30.0–36.0)
MCV: 97.6 fL (ref 80.0–100.0)
Monocytes Absolute: 0.5 10*3/uL (ref 0.1–1.0)
Monocytes Relative: 7 %
Neutro Abs: 5 10*3/uL (ref 1.7–7.7)
Neutrophils Relative %: 76 %
Platelets: 236 10*3/uL (ref 150–400)
RBC: 4.17 MIL/uL (ref 3.87–5.11)
RDW: 13.1 % (ref 11.5–15.5)
WBC: 6.6 10*3/uL (ref 4.0–10.5)
nRBC: 0 % (ref 0.0–0.2)

## 2023-08-05 LAB — TSH: TSH: 2.324 u[IU]/mL (ref 0.350–4.500)

## 2023-08-05 LAB — LACTATE DEHYDROGENASE: LDH: 102 U/L (ref 98–192)

## 2023-08-05 LAB — VITAMIN D 25 HYDROXY (VIT D DEFICIENCY, FRACTURES): Vit D, 25-Hydroxy: 24.44 ng/mL — ABNORMAL LOW (ref 30–100)

## 2023-08-05 LAB — CORTISOL: Cortisol, Plasma: 10.5 ug/dL

## 2023-08-05 MED ORDER — SODIUM CHLORIDE 0.9% FLUSH
10.0000 mL | INTRAVENOUS | Status: DC | PRN
  Administered 2023-08-05: 10 mL via INTRAVENOUS

## 2023-08-05 MED ORDER — HEPARIN SOD (PORK) LOCK FLUSH 100 UNIT/ML IV SOLN
500.0000 [IU] | Freq: Once | INTRAVENOUS | Status: AC
  Administered 2023-08-05: 500 [IU] via INTRAVENOUS

## 2023-08-05 NOTE — Patient Instructions (Addendum)
[facility] at [facility] Discharge Instructions   You were seen and examined today by Dr. Ellin Saba.  He reviewed the results of your lab work from November which are normal/stable.   He reviewed the results of your December scan which is normal. There was a spot seen in the pelvis, but it is unclear what it may be. This is something we will just monitor.   We will see you back in 6 months. We will repeat lab work and a CT scan prior to this visit.    Return as scheduled.    Thank you for choosing [facility] at [facility] to provide your oncology and hematology care.  To afford each patient quality time with our provider, please arrive at least 15 minutes before your scheduled appointment time.   If you have a lab appointment with the Cancer Center please come in thru the Main Entrance and check in at the main information desk.  You need to re-schedule your appointment should you arrive 10 or more minutes late.  We strive to give you quality time with our providers, and arriving late affects you and other patients whose appointments are after yours.  Also, if you no show three or more times for appointments you may be dismissed from the clinic at the providers discretion.     Again, thank you for choosing [facility].  Our hope is that these requests will decrease the amount of time that you wait before being seen by our physicians.       _____________________________________________________________  Should you have questions after your visit to [facility], please contact our office at [phone] and follow the prompts.  Our office hours are 8:00 a.m. and 4:30 p.m. Monday - Friday.  Please note that voicemails left after 4:00 p.m. may not be returned until the following business day.  We are closed weekends and major holidays.  You do have access to a nurse 24-7, just call the main number to the clinic [phone]  and do not press any options, hold on the line and a nurse will answer the phone.    For prescription refill requests, have your pharmacy contact our office and allow 72 hours.    Due to Covid, you will need to wear a mask upon entering the hospital. If you do not have a mask, a mask will be given to you at the Main Entrance upon arrival. For doctor visits, patients may have 1 support person age 44 or older with them. For treatment visits, patients can not have anyone with them due to social distancing guidelines and our immunocompromised population.

## 2023-08-05 NOTE — Progress Notes (Signed)
[facility] [address] [place] [address]    Clinic Day:  08/05/2023  Referring physician: Elfredia Nevins, MD  Patient Care Team: Elfredia Nevins, MD as PCP - General (Internal Medicine) Ronne Binning Mardene Celeste, MD as Consulting Physician (Urology) Nils Pyle, MD as Consulting Physician (Radiation Oncology) Doreatha Massed, MD as Consulting Physician (Hematology)   ASSESSMENT & PLAN:   Assessment: 1.  Stage II (T2 N0) high-grade urothelial carcinoma of the bladder: - Presented with intermittent gross hematuria ongoing since 03/2022.  CT [facility] on 06/19/2022: 4.4 cm in the roof of the bladder. - TURBT by Dr. Ronne Binning (08/29/2022): Infiltrating high-grade urothelial carcinoma, invading muscularis propria (detrusor muscle). - CT CAP (09/26/2022): Interval resection of the mass of the superior right aspect of the urinary bladder with residual posterior bladder dome and right eccentric hyperenhancing wall thickening measuring up to 1.8 cm.  No adenopathy or metastatic disease. - Patient does not want to have radical cystectomy.  She met with Dr. Langston Masker at [facility] and want to proceed with trimodality therapy. -- 5-FU plus mitomycin and XRT from 10/30/2022 through 12/01/2022.   2.  Social/family history: - She lives by herself and is independent of ADLs and IADLs.  She worked as a Data processing manager prior to retirement.  Current active smoker, 1 pack/day for the last 50 years, started at age 23. - Paternal grandfather had throat cancer.  Paternal grandmother had ovarian cancer.    Plan: 1.  Stage II (T2 N0) high-grade urothelial carcinoma of the bladder: - She reported fatigue since she had CT scan done on 06/23/2023.  No weight loss, fevers or night sweats. - CT CAP (06/23/2023): Questionable persistent but decreased symmetric wall thickening on the right side of the bladder.  New round soft tissue density along the left external iliac artery measuring 17 mm  appears to be in continuity with the broad ligament and likely reflects the left ovary over left external iliac lymph node but difficult to localize given lack of IV contrast.  Irregular 5 mm lung nodule in the medial aspect the left upper lobe nonspecific. - I have reviewed labs from 08/05/2023: Normal LFTs and CBC.  Renal function is normal.  TSH is 2.324. - Upon further questioning, she thinks the fatigue is likely from depression.  I have offered to start her on antidepressant.  She has declined.  She will follow-up with Dr. Sherwood Gambler. - Recommend follow-up in 6 months with repeat CT CAP and labs.       Orders Placed This Encounter  Procedures   CT CHEST ABDOMEN PELVIS W CONTRAST    Standing Status:   Future    Expected Date:   02/02/2024    Expiration Date:   08/04/2024    If indicated for the ordered procedure, I authorize the administration of contrast media per Radiology protocol:   Yes    Does the patient have a contrast media/X-ray dye allergy?:   No    Preferred imaging location?:   [facility]    If indicated for the ordered procedure, I authorize the administration of oral contrast media per Radiology protocol:   Yes   TSH   Cortisol   CBC with Differential/Platelet   Comprehensive metabolic panel   Lactate dehydrogenase   Vitamin D 25 hydroxy     I,Helena R Teague,acting as a scribe for Doreatha Massed, MD.,have documented all relevant documentation on the behalf of Doreatha Massed, MD,as directed by  Doreatha Massed, MD  while in the presence of Doreatha Massed, MD.  I, Doreatha Massed MD, have reviewed the above documentation for accuracy and completeness, and I agree with the above.     Doreatha Massed, MD   1/28/20255:14 PM  CHIEF COMPLAINT:   Diagnosis: muscle invasive bladder cancer    Cancer Staging  Malignant neoplasm of urinary bladder [facility]) Staging form: Urinary Bladder, AJCC 8th Edition - Clinical stage from 10/09/2022: Stage  II (cT2, cN0, cM0) - Unsigned    Prior Therapy: TURBT with gemcitabine instillation 08/29/22   Current Therapy:  Concurrent chemoradiation therapy with 5FU and [facility], started on 10/30/2022.   HISTORY OF PRESENT ILLNESS:   Oncology History  Malignant neoplasm of urinary bladder (HCC)  07/29/2022 Initial Diagnosis   Malignant neoplasm of urinary bladder   10/30/2022 -  Chemotherapy   Patient is on Treatment Plan : BLADDER Mitomycin D1 + 5FU D1-5, 21-25 + XRT        INTERVAL HISTORY:   Raven Lopez is a 68 y.o. female presenting to clinic today for follow up of muscle invasive bladder cancer. She was last seen by me on 01/08/23.  Since her last visit, she underwent CT C/A/P on 06/23/23 that found: questionable persistent but decreased asymmetric wall thickening along the right side of the urinary bladder; new round soft tissue density along the left external iliac artery measuring 17 mm appears to be in continuity with a broad ligament and likely reflects the left ovary over a left external iliac lymph node; irregular 5 mm pulmonary nodule in the medial aspect of the left upper lobe, nonspecific; similar common bile duct dilation measuring 12 mm and prominence of the pancreatic duct measuring 4 mm.  She also presented to the ED on 02/12/23 for UTI symptoms. She was prescribed Macrobid 100 mg twice daily x 5 days and fluconazole 150 mg tablet for possible yeast infection with antibiotic.   She had 2 bedside cytoscopies with Dr. Ronne Binning on 07/21/23 and 07/25/23.  Her appetite level is at 75%. Her energy level is at 0%. She is accompanied by family. She reports fatigue that began the week after her CT scan. She is unable to complete daily activities without severe tiredness. She notes SOB when walking her dog. She has not taken any antidepressants before, other than 1 pill of trazodone 30 years ago. She denies any thyroid issues, though it does run in her family.  She notes normal urination. She has gained 1  pound since her last visit, though she has a decreased oral intake. She has not taken any Megace. She denies any fevers, night sweats, or new onset pains. She had been prescribed weekly Vitamin D3 in the past, but has not taken any recently.   PAST MEDICAL HISTORY:   Past Medical History: Past Medical History:  Diagnosis Date   AC (acromioclavicular) joint bone spurs    Anxiety    Asthma    COPD (chronic obstructive pulmonary disease) (HCC)    DDD (degenerative disc disease), lumbar    Hypertension    Neck fracture (HCC)    Panic disorder    Scoliosis     Surgical History: Past Surgical History:  Procedure Laterality Date   BLADDER INSTILLATION N/A 08/29/2022   Procedure: BLADDER INSTILLATION;  Surgeon: Malen Gauze, MD;  Location: [facility];  Service: Urology;  Laterality: N/A;   CESAREAN SECTION     CYSTOSCOPY W/ RETROGRADES Bilateral 08/29/2022   Procedure: CYSTOSCOPY WITH RETROGRADE PYELOGRAM;  Surgeon: Malen Gauze, MD;  Location: [facility];  Service: Urology;  Laterality: Bilateral;   IR IMAGING GUIDED PORT INSERTION  10/17/2022   TRANSURETHRAL RESECTION OF BLADDER TUMOR N/A 08/29/2022   Procedure: TRANSURETHRAL RESECTION OF BLADDER TUMOR (TURBT);  Surgeon: Malen Gauze, MD;  Location: [facility];  Service: Urology;  Laterality: N/A;   wisdom teeth removal      Social History: Social History   Socioeconomic History   Marital status: Widowed    Spouse name: Not on file   Number of children: Not on file   Years of education: Not on file   Highest education level: Not on file  Occupational History   Not on file  Tobacco Use   Smoking status: Every Day    Current packs/day: 1.00    Average packs/day: 1 pack/day for 40.0 years (40.0 ttl pk-yrs)    Types: Cigarettes   Smokeless tobacco: Never  Vaping Use   Vaping status: Never Used  Substance and Sexual Activity   Alcohol use: Not Currently   Drug use: Not Currently   Sexual activity: Not Currently     Birth control/protection: Post-menopausal  Other Topics Concern   Not on file  Social History Narrative   Not on file   Social Drivers of Health   Financial Resource Strain: Low Risk  (02/21/2022)   Overall Financial Resource Strain (CARDIA)    Difficulty of Paying Living Expenses: Not hard at all  Food Insecurity: No Food Insecurity (09/12/2022)   Hunger Vital Sign    Worried About Running Out of Food in the Last Year: Never true    Ran Out of Food in the Last Year: Never true  Transportation Needs: No Transportation Needs (09/12/2022)   PRAPARE - Administrator, Civil Service (Medical): No    Lack of Transportation (Non-Medical): No  Physical Activity: Inactive (02/21/2022)   Exercise Vital Sign    Days of Exercise per Week: 0 days    Minutes of Exercise per Session: 10 min  Stress: Stress Concern Present (02/21/2022)   [employer] of Occupational Health - Occupational Stress Questionnaire    Feeling of Stress : To some extent  Social Connections: Socially Isolated (02/21/2022)   Social Connection and Isolation Panel [NHANES]    Frequency of Communication with Friends and Family: More than three times a week    Frequency of Social Gatherings with Friends and Family: Twice a week    Attends Religious Services: Never    Database administrator or Organizations: No    Attends Banker Meetings: Never    Marital Status: Widowed  Intimate Partner Violence: Not At Risk (09/12/2022)   Humiliation, Afraid, Rape, and Kick questionnaire    Fear of Current or Ex-Partner: No    Emotionally Abused: No    Physically Abused: No    Sexually Abused: No    Family History: Family History  Problem Relation Age of Onset   Aneurysm Father    Kidney failure Mother    Hypertension Sister    Thyroid disease Sister    Anxiety disorder Sister    Colitis Sister    Irritable bowel syndrome Sister     Current Medications:  Current Outpatient Medications:     acetaminophen (TYLENOL) 500 MG tablet, Take 1,000 mg by mouth every 4 (four) hours as needed for mild pain or moderate pain., Disp: , Rfl:    amLODipine (NORVASC) 10 MG tablet, Take 10 mg by mouth at bedtime., Disp: , Rfl:  dextrose 5 % SOLN 1,000 mL with fluorouracil 5 GM/100ML SOLN, Inject into the vein., Disp: , Rfl:    diazepam (VALIUM) 10 MG tablet, Take 10 mg by mouth in the morning and at bedtime., Disp: , Rfl:    fluconazole (DIFLUCAN) 150 MG tablet, Take 1 tablet (150 mg total) by mouth daily., Disp: 2 tablet, Rfl: 0   losartan (COZAAR) 25 MG tablet, Take 25 mg by mouth every evening., Disp: , Rfl:    megestrol (MEGACE) 400 MG/10ML suspension, Take 10 mLs (400 mg total) by mouth 2 (two) times daily., Disp: 480 mL, Rfl: 2   MITOMYCIN IV, Inject into the vein., Disp: , Rfl:    nitrofurantoin, macrocrystal-monohydrate, (MACROBID) 100 MG capsule, Take 1 capsule (100 mg total) by mouth 2 (two) times daily., Disp: 10 capsule, Rfl: 0   ofloxacin (OCUFLOX) 0.3 % ophthalmic solution, Place 2 drops into the right eye 4 (four) times daily., Disp: , Rfl:    prednisoLONE acetate (PRED FORTE) 1 % ophthalmic suspension, Place into the right eye 4 (four) times daily., Disp: , Rfl:    lidocaine-prilocaine (EMLA) cream, Apply to affected area once (Patient not taking: Reported on 08/05/2023), Disp: 30 g, Rfl: 3   prochlorperazine (COMPAZINE) 10 MG tablet, Take 1 tablet (10 mg total) by mouth every 6 (six) hours as needed for nausea or vomiting. (Patient not taking: Reported on 08/05/2023), Disp: 30 tablet, Rfl: 1 No current facility-administered medications for this visit.  Facility-Administered Medications Ordered in Other Visits:    sodium chloride flush (NS) 0.9 % injection 10 mL, 10 mL, Intravenous, PRN, Doreatha Massed, MD, 10 mL at 08/05/23 1546   Allergies: Allergies  Allergen Reactions   Penicillins Shortness Of Breath and Swelling    Has patient had a PCN reaction causing immediate rash,  facial/tongue/throat swelling, SOB or lightheadedness with hypotension: Yes Has patient had a PCN reaction causing severe rash involving mucus membranes or skin necrosis: No Has patient had a PCN reaction that required hospitalization: Yes Has patient had a PCN reaction occurring within the last 10 years: No If all of the above answers are "NO", then may proceed with Cephalosporin use.    Aspirin     REACTION: dizzy,weak and upset stomach   Demerol [Meperidine Hcl] Other (See Comments)    Altered mental status-anger, hallucinations   Sulfonamide Derivatives Nausea And Vomiting    REVIEW OF SYSTEMS:   Review of Systems  Constitutional:  Negative for chills, fatigue and fever.  HENT:   Negative for lump/mass, mouth sores, nosebleeds, sore throat and trouble swallowing.   Eyes:  Negative for eye problems.  Respiratory:  Positive for shortness of breath. Negative for cough.   Cardiovascular:  Positive for chest pain and palpitations. Negative for leg swelling.  Gastrointestinal:  Negative for abdominal pain, constipation, diarrhea, nausea and vomiting.  Genitourinary:  Negative for bladder incontinence, difficulty urinating, dysuria, frequency, hematuria and nocturia.   Musculoskeletal:  Negative for arthralgias, back pain, flank pain, myalgias and neck pain.  Skin:  Negative for itching and rash.  Neurological:  Positive for dizziness. Negative for headaches and numbness.  Hematological:  Does not bruise/bleed easily.  Psychiatric/Behavioral:  Positive for sleep disturbance. Negative for depression and suicidal ideas. The patient is nervous/anxious.   All other systems reviewed and are negative.    VITALS:   Blood pressure 104/73, pulse 71, temperature 97.6 F (36.4 C), temperature source Oral, resp. rate 16, weight 80 lb 11 oz (36.6 kg), SpO2 98%.  Wt  Readings from Last 3 Encounters:  08/05/23 80 lb 11 oz (36.6 kg)  01/08/23 79 lb 12.8 oz (36.2 kg)  12/01/22 82 lb (37.2 kg)     Body mass index is 14.76 kg/m.  Performance status (ECOG): 1 - Symptomatic but completely ambulatory  PHYSICAL EXAM:   Physical Exam Vitals and nursing note reviewed. Exam conducted with a chaperone present.  Constitutional:      Appearance: Normal appearance.  Cardiovascular:     Rate and Rhythm: Normal rate and regular rhythm.     Pulses: Normal pulses.     Heart sounds: Normal heart sounds.  Pulmonary:     Effort: Pulmonary effort is normal.     Breath sounds: Normal breath sounds.  Abdominal:     Palpations: Abdomen is soft. There is no hepatomegaly, splenomegaly or mass.     Tenderness: There is no abdominal tenderness.  Musculoskeletal:     Right lower leg: No edema.     Left lower leg: No edema.  Lymphadenopathy:     Cervical: No cervical adenopathy.     Right cervical: No superficial, deep or posterior cervical adenopathy.    Left cervical: No superficial, deep or posterior cervical adenopathy.     Upper Body:     Right upper body: No supraclavicular or axillary adenopathy.     Left upper body: No supraclavicular or axillary adenopathy.  Neurological:     General: No focal deficit present.     Mental Status: She is alert and oriented to person, place, and time.  Psychiatric:        Mood and Affect: Mood normal.        Behavior: Behavior normal.     LABS:      Latest Ref Rng & Units 08/05/2023    3:35 PM 05/21/2023    3:29 PM 01/08/2023    2:57 PM  CBC  WBC 4.0 - 10.5 K/uL 6.6  7.2  7.4   Hemoglobin 12.0 - 15.0 g/dL 16.1  09.6  04.5   Hematocrit 36.0 - 46.0 % 40.7  42.1  37.2   Platelets 150 - 400 K/uL 236  229  274       Latest Ref Rng & Units 08/05/2023    3:35 PM 05/21/2023    3:29 PM 01/08/2023    2:57 PM  CMP  Glucose 70 - 99 mg/dL 89  93  409   BUN 8 - 23 mg/dL 21  17  15    Creatinine 0.44 - 1.00 mg/dL 8.11  9.14  7.82   Sodium 135 - 145 mmol/L 136  136  135   Potassium 3.5 - 5.1 mmol/L 4.2  4.1  3.8   Chloride 98 - 111 mmol/L 103  103  103    CO2 22 - 32 mmol/L 25  22  23    Calcium 8.9 - 10.3 mg/dL 9.4  8.9  8.8   Total Protein 6.5 - 8.1 g/dL 7.5  7.0  6.8   Total Bilirubin 0.0 - 1.2 mg/dL 0.5  0.6  0.6   Alkaline Phos 38 - 126 U/L 54  49  57   AST 15 - 41 U/L 14  15  15    ALT 0 - 44 U/L 8  9  10       No results found for: "CEA1", "CEA" / No results found for: "CEA1", "CEA" No results found for: "PSA1" No results found for: "NFA213" No results found for: "CAN125"  No results found for: "TOTALPROTELP", "ALBUMINELP", "  A1GS", "A2GS", "BETS", "BETA2SER", "GAMS", "MSPIKE", "SPEI" No results found for: "TIBC", "FERRITIN", "IRONPCTSAT" Lab Results  Component Value Date   LDH 102 08/05/2023     STUDIES:   No results found.

## 2023-08-05 NOTE — Patient Instructions (Signed)

## 2023-08-05 NOTE — Progress Notes (Signed)
Patients port flushed without difficulty.  Good blood return noted with no bruising or swelling noted at site.  Band aid applied.  VSS with discharge and left in satisfactory condition with no s/s of distress noted.

## 2023-08-06 ENCOUNTER — Other Ambulatory Visit: Payer: Self-pay

## 2023-09-08 DIAGNOSIS — G9332 Myalgic encephalomyelitis/chronic fatigue syndrome: Secondary | ICD-10-CM | POA: Diagnosis not present

## 2023-09-08 DIAGNOSIS — C679 Malignant neoplasm of bladder, unspecified: Secondary | ICD-10-CM | POA: Diagnosis not present

## 2023-09-08 DIAGNOSIS — E440 Moderate protein-calorie malnutrition: Secondary | ICD-10-CM | POA: Diagnosis not present

## 2023-09-08 DIAGNOSIS — Z681 Body mass index (BMI) 19 or less, adult: Secondary | ICD-10-CM | POA: Diagnosis not present

## 2023-09-08 DIAGNOSIS — I10 Essential (primary) hypertension: Secondary | ICD-10-CM | POA: Diagnosis not present

## 2023-10-29 ENCOUNTER — Other Ambulatory Visit: Payer: Medicare Other | Admitting: Urology

## 2023-11-04 ENCOUNTER — Inpatient Hospital Stay: Payer: Medicare Other | Attending: Hematology

## 2023-11-04 DIAGNOSIS — C679 Malignant neoplasm of bladder, unspecified: Secondary | ICD-10-CM | POA: Insufficient documentation

## 2023-11-04 DIAGNOSIS — Z452 Encounter for adjustment and management of vascular access device: Secondary | ICD-10-CM | POA: Insufficient documentation

## 2023-11-04 MED ORDER — SODIUM CHLORIDE 0.9% FLUSH
10.0000 mL | Freq: Once | INTRAVENOUS | Status: AC
  Administered 2023-11-04: 10 mL via INTRAVENOUS

## 2023-11-04 MED ORDER — HEPARIN SOD (PORK) LOCK FLUSH 100 UNIT/ML IV SOLN
500.0000 [IU] | Freq: Once | INTRAVENOUS | Status: AC
  Administered 2023-11-04: 500 [IU] via INTRAVENOUS

## 2023-11-04 NOTE — Progress Notes (Signed)
Patients port flushed without difficulty. Good blood return noted with no bruising or swelling noted at site. Band aid applied. VSS with discharge and left in satisfactory condition with no s/s of distress noted. All follow ups as scheduled.  Marquie Aderhold [employer]

## 2023-12-10 ENCOUNTER — Other Ambulatory Visit: Admitting: Urology

## 2023-12-16 ENCOUNTER — Other Ambulatory Visit: Payer: Self-pay

## 2023-12-30 ENCOUNTER — Emergency Department (HOSPITAL_COMMUNITY)
Admission: EM | Admit: 2023-12-30 | Discharge: 2023-12-30 | Disposition: A | Discharge status: Home / Self Care | Attending: Emergency Medicine | Admitting: Emergency Medicine

## 2023-12-30 ENCOUNTER — Encounter (HOSPITAL_COMMUNITY): Payer: Self-pay | Admitting: Emergency Medicine

## 2023-12-30 ENCOUNTER — Other Ambulatory Visit: Payer: Self-pay

## 2023-12-30 DIAGNOSIS — J449 Chronic obstructive pulmonary disease, unspecified: Secondary | ICD-10-CM | POA: Diagnosis not present

## 2023-12-30 DIAGNOSIS — Z8551 Personal history of malignant neoplasm of bladder: Secondary | ICD-10-CM | POA: Diagnosis not present

## 2023-12-30 DIAGNOSIS — R531 Weakness: Secondary | ICD-10-CM | POA: Diagnosis present

## 2023-12-30 DIAGNOSIS — E860 Dehydration: Secondary | ICD-10-CM | POA: Diagnosis not present

## 2023-12-30 DIAGNOSIS — I10 Essential (primary) hypertension: Secondary | ICD-10-CM | POA: Insufficient documentation

## 2023-12-30 DIAGNOSIS — Z79899 Other long term (current) drug therapy: Secondary | ICD-10-CM | POA: Insufficient documentation

## 2023-12-30 LAB — COMPREHENSIVE METABOLIC PANEL WITH GFR
ALT: 8 U/L (ref 0–44)
AST: 13 U/L — ABNORMAL LOW (ref 15–41)
Albumin: 3.3 g/dL — ABNORMAL LOW (ref 3.5–5.0)
Alkaline Phosphatase: 63 U/L (ref 38–126)
Anion gap: 9 (ref 5–15)
BUN: 19 mg/dL (ref 8–23)
CO2: 23 mmol/L (ref 22–32)
Calcium: 8.8 mg/dL — ABNORMAL LOW (ref 8.9–10.3)
Chloride: 104 mmol/L (ref 98–111)
Creatinine, Ser: 0.89 mg/dL (ref 0.44–1.00)
GFR, Estimated: >60 mL/min (ref >60)
Glucose, Bld: 96 mg/dL (ref 70–99)
Potassium: 4.1 mmol/L (ref 3.5–5.1)
Sodium: 136 mmol/L (ref 135–145)
Total Bilirubin: 0.2 mg/dL (ref 0.0–1.2)
Total Protein: 6.7 g/dL (ref 6.5–8.1)

## 2023-12-30 LAB — URINALYSIS, ROUTINE W REFLEX MICROSCOPIC
Bacteria, UA: NONE SEEN
Bilirubin Urine: NEGATIVE
Glucose, UA: NEGATIVE mg/dL
Hgb urine dipstick: NEGATIVE
Ketones, ur: NEGATIVE mg/dL
Nitrite: NEGATIVE
Protein, ur: 30 mg/dL — AB
Specific Gravity, Urine: 1.012 (ref 1.005–1.030)
pH: 6 (ref 5.0–8.0)

## 2023-12-30 LAB — CBC
HCT: 36.8 % (ref 36.0–46.0)
Hemoglobin: 11.9 g/dL — ABNORMAL LOW (ref 12.0–15.0)
MCH: 31.2 pg (ref 26.0–34.0)
MCHC: 32.3 g/dL (ref 30.0–36.0)
MCV: 96.3 fL (ref 80.0–100.0)
Platelets: 309 10*3/uL (ref 150–400)
RBC: 3.82 MIL/uL — ABNORMAL LOW (ref 3.87–5.11)
RDW: 14.1 % (ref 11.5–15.5)
WBC: 8.3 10*3/uL (ref 4.0–10.5)
nRBC: 0 % (ref 0.0–0.2)

## 2023-12-30 LAB — CBG MONITORING, ED: Glucose-Capillary: 99 mg/dL (ref 70–99)

## 2023-12-30 LAB — MAGNESIUM: Magnesium: 2.3 mg/dL (ref 1.7–2.4)

## 2023-12-30 LAB — TSH: TSH: 2.177 u[IU]/mL (ref 0.350–4.500)

## 2023-12-30 MED ORDER — LACTATED RINGERS IV BOLUS
1000.0000 mL | Freq: Once | INTRAVENOUS | Status: AC
  Administered 2023-12-30: 1000 mL via INTRAVENOUS

## 2023-12-30 NOTE — ED Notes (Signed)
 Patient verbalizes understanding of discharge instructions and follow up care if needed. Patient ambulatory out of department at this independently with family.

## 2023-12-30 NOTE — ED Provider Notes (Signed)
 [place] EMERGENCY DEPARTMENT AT [facility] Provider Note   CSN: [id number] Arrival date & time: 12/30/23  1933     Patient presents with: No chief complaint on file.   Raven Lopez is a 68 y.o. female.   HPI Patient presents for generalized weakness.  Medical history includes HTN, COPD, anxiety, arthritis, bladder cancer, myalgic and cephalomyelitis/chronic fatigue syndrome.  Bladder cancer was diagnosed 2 years ago.  She underwent TURBT in February of last year.  She was undergoing chemotherapy and radiation, but finished last year.  Patient has recently been able to gain back some weight.  She has been drinking fluids and eating peanut butter.  She has chronic weakness but feels that this is worsened over the past 2 weeks.  She is concerned of dehydration.  She denies any areas of discomfort.    Prior to Admission medications   Medication Sig Start Date End Date Taking? Authorizing Provider  acetaminophen  (TYLENOL ) 500 MG tablet Take 1,000 mg by mouth every 4 (four) hours as needed for mild pain or moderate pain.    [provider]  amLODipine (NORVASC) 10 MG tablet Take 10 mg by mouth at bedtime.    [provider]  dextrose  5 % SOLN 1,000 mL with fluorouracil  5 GM/100ML SOLN Inject into the vein. 10/30/22   [provider]  diazepam  (VALIUM ) 10 MG tablet Take 10 mg by mouth in the morning and at bedtime.    [provider]  fluconazole  (DIFLUCAN ) 150 MG tablet Take 1 tablet (150 mg total) by mouth daily. 07/21/23   McKenzie, Belvie CROME, MD  lidocaine -prilocaine  (EMLA ) cream Apply to affected area once Patient not taking: Reported on 08/05/2023 10/22/22   Rogers Hai, MD  losartan (COZAAR) 25 MG tablet Take 25 mg by mouth every evening. 12/14/21   [provider]  megestrol  (MEGACE ) 400 MG/10ML suspension Take 10 mLs (400 mg total) by mouth 2 (two) times daily. 10/30/22   Rogers Hai, MD  MITOMYCIN  IV Inject into  the vein. 10/30/22   [provider]  nitrofurantoin , macrocrystal-monohydrate, (MACROBID ) 100 MG capsule Take 1 capsule (100 mg total) by mouth 2 (two) times daily. 02/12/23   Leath-Warren, Etta PARAS, NP  ofloxacin (OCUFLOX) 0.3 % ophthalmic solution Place 2 drops into the right eye 4 (four) times daily. 01/03/23   [provider]  prednisoLONE acetate (PRED FORTE) 1 % ophthalmic suspension Place into the right eye 4 (four) times daily. 01/03/23   [provider]  prochlorperazine  (COMPAZINE ) 10 MG tablet Take 1 tablet (10 mg total) by mouth every 6 (six) hours as needed for nausea or vomiting. Patient not taking: Reported on 08/05/2023 10/22/22   Rogers Hai, MD    Allergies: Penicillins, Aspirin, Demerol [meperidine hcl], and Sulfonamide derivatives    Review of Systems  Constitutional:  Positive for fatigue.  Neurological:  Positive for weakness (Generalized).  All other systems reviewed and are negative.   Updated Vital Signs BP (!) 149/75 (BP Location: Right Arm)   Pulse 73   Temp 98.3 F (36.8 C)   Resp 16   Ht 5' 2 (1.575 m)   Wt 36.3 kg   SpO2 99%   BMI 14.63 kg/m   Physical Exam Vitals and nursing note reviewed.  Constitutional:      General: She is not in acute distress.    Appearance: She is well-developed and underweight. She is not ill-appearing, toxic-appearing or diaphoretic.  HENT:     Head: Normocephalic and  atraumatic.     Right Ear: External ear normal.     Left Ear: External ear normal.     Nose: Nose normal.     Mouth/Throat:     Mouth: Mucous membranes are moist.   Eyes:     Extraocular Movements: Extraocular movements intact.     Conjunctiva/sclera: Conjunctivae normal.    Cardiovascular:     Rate and Rhythm: Normal rate and regular rhythm.  Pulmonary:     Effort: Pulmonary effort is normal. No respiratory distress.     Breath sounds: Normal breath sounds. No wheezing or rales.  Chest:     Chest wall: No  tenderness.  Abdominal:     General: There is no distension.     Palpations: Abdomen is soft.     Tenderness: There is no abdominal tenderness.   Musculoskeletal:        General: No swelling or deformity. Normal range of motion.     Cervical back: Normal range of motion and neck supple.   Skin:    General: Skin is warm and dry.     Coloration: Skin is not jaundiced or pale.   Neurological:     General: No focal deficit present.     Mental Status: She is alert and oriented to person, place, and time.     Cranial Nerves: No cranial nerve deficit.     Sensory: No sensory deficit.     Motor: No weakness.     Coordination: Coordination normal.   Psychiatric:        Mood and Affect: Mood normal.        Behavior: Behavior normal.     (all labs ordered are listed, but only abnormal results are displayed) Labs Reviewed  COMPREHENSIVE METABOLIC PANEL WITH GFR - Abnormal; Notable for the following components:      Result Value   Calcium 8.8 (*)    Albumin 3.3 (*)    AST 13 (*)    All other components within normal limits  CBC - Abnormal; Notable for the following components:   RBC 3.82 (*)    Hemoglobin 11.9 (*)    All other components within normal limits  URINALYSIS, ROUTINE W REFLEX MICROSCOPIC - Abnormal; Notable for the following components:   Protein, ur 30 (*)    Leukocytes,Ua SMALL (*)    All other components within normal limits  MAGNESIUM  TSH  CBG MONITORING, ED    EKG: None  Radiology: No results found.   Procedures   Medications Ordered in the ED  lactated ringers  bolus 1,000 mL (1,000 mLs Intravenous New Bag/Given 12/30/23 2051)                                    Medical Decision Making Amount and/or Complexity of Data Reviewed Labs: ordered.   This patient presents to the ED for concern of generalized weakness, this involves an extensive number of treatment options, and is a complaint that carries with it a high risk of complications and  morbidity.  The differential diagnosis includes deconditioning, metabolic derangements, dehydration, infection   Co morbidities / Chronic conditions that complicate the patient evaluation  HTN, COPD, anxiety, arthritis, bladder cancer, myalgic and cephalomyelitis/chronic fatigue syndrome   Additional history obtained:  Additional history obtained from EMR External records from outside source obtained and reviewed including patient's sister   Lab Tests:  I Ordered, and personally interpreted labs.  The pertinent results include: Normal kidney function, normal electrolytes, no leukocytosis.  Hemoglobin is slightly decreased in baseline.  No evidence of UTI.   Cardiac Monitoring: / EKG:  The patient was maintained on a cardiac monitor.  I personally viewed and interpreted the cardiac monitored which showed an underlying rhythm of: Sinus rhythm   Problem List / ED Course / Critical interventions / Medication management  Patient presents for generalized weakness.  Although this is a chronic condition for her, she feels like it is worsened over the past couple weeks.  She feels that she is dehydrated.  She has been drinking fluids and eating peanut butter.  She also states that she drinks coffee and is worried that this might have overdiuresed her.  She is overall well-appearing on exam.  She is chronically underweight but states that she has been able to gain back 5 pounds lately.  She has no areas of pain or tenderness.  Breathing is unlabored.  Laboratory workup was initiated.  Bolus of IV fluids was ordered.  Patient's lab work was unremarkable.  On reassessment, patient states that she feels much better after IV fluids.  She states that she has recently had a dry feeling in her eyes and that now that feels much better.  Her sister got her some fast food and she is eating and drinking in the ED.  Patient was discharged in stable condition. I ordered medication including IV fluids for  hydration Reevaluation of the patient after these medicines showed that the patient improved I have reviewed the patients home medicines and have made adjustments as needed     Final diagnoses:  Dehydration    ED Discharge Orders     None          Melvenia Motto, MD 12/30/23 2143

## 2023-12-30 NOTE — ED Triage Notes (Signed)
 Pt states they think they are dehydrated over the past week causing weakness; and eyes feel dry. Pt states they have have been eating and drinking ok but feel weak. Pt states has a port with last tx June 2024.

## 2023-12-30 NOTE — Discharge Instructions (Signed)
 Your lab results are reassuring.  Continue to eat food and drink fluids regularly to stay hydrated at home.  Return to the emergency department for any new or worsening symptoms of concern.

## 2023-12-30 NOTE — ED Notes (Signed)
 Patient ambulatory to and from restroom independently without deficit.

## 2024-01-15 DIAGNOSIS — E440 Moderate protein-calorie malnutrition: Secondary | ICD-10-CM | POA: Diagnosis not present

## 2024-01-15 DIAGNOSIS — I10 Essential (primary) hypertension: Secondary | ICD-10-CM | POA: Diagnosis not present

## 2024-01-16 ENCOUNTER — Other Ambulatory Visit: Payer: Self-pay

## 2024-01-21 ENCOUNTER — Other Ambulatory Visit: Admitting: Urology

## 2024-01-27 ENCOUNTER — Ambulatory Visit (HOSPITAL_COMMUNITY): Payer: Medicare Other

## 2024-01-27 ENCOUNTER — Other Ambulatory Visit: Payer: Medicare Other

## 2024-01-27 ENCOUNTER — Inpatient Hospital Stay: Payer: Medicare Other | Attending: Hematology

## 2024-01-28 ENCOUNTER — Telehealth: Payer: Self-pay

## 2024-01-28 NOTE — Telephone Encounter (Signed)
 Call placed to patient due to recent no show for labs and CT scan. Patient states that she is not interested in doing anymore labs and scans. Dr. Katragadda made aware who advised that patient can follow up PRN moving forward. Patient made aware and agreeable. All appointments cancelled.

## 2024-02-03 ENCOUNTER — Inpatient Hospital Stay: Payer: Medicare Other | Admitting: Hematology

## 2024-02-11 ENCOUNTER — Inpatient Hospital Stay: Attending: Hematology

## 2024-02-11 DIAGNOSIS — C679 Malignant neoplasm of bladder, unspecified: Secondary | ICD-10-CM | POA: Insufficient documentation

## 2024-02-11 DIAGNOSIS — Z452 Encounter for adjustment and management of vascular access device: Secondary | ICD-10-CM | POA: Diagnosis not present

## 2024-02-11 NOTE — Progress Notes (Signed)
 Patients port flushed without difficulty.  Good blood return noted with no bruising or swelling noted at site.  Band aid applied.  VSS with discharge and left in satisfactory condition with no s/s of distress noted.

## 2024-02-11 NOTE — Patient Instructions (Signed)

## 2024-02-16 ENCOUNTER — Other Ambulatory Visit: Payer: Self-pay | Admitting: *Deleted

## 2024-02-23 ENCOUNTER — Ambulatory Visit: Admitting: Physician Assistant

## 2024-03-17 ENCOUNTER — Ambulatory Visit: Admitting: Urology

## 2024-03-17 VITALS — BP 99/66 | HR 76

## 2024-03-17 DIAGNOSIS — C679 Malignant neoplasm of bladder, unspecified: Secondary | ICD-10-CM | POA: Diagnosis not present

## 2024-03-17 LAB — URINALYSIS, ROUTINE W REFLEX MICROSCOPIC
Bilirubin, UA: NEGATIVE
Glucose, UA: NEGATIVE
Ketones, UA: NEGATIVE
Leukocytes,UA: NEGATIVE
Nitrite, UA: NEGATIVE
RBC, UA: NEGATIVE
Specific Gravity, UA: 1.02 (ref 1.005–1.030)
Urobilinogen, Ur: 0.2 mg/dL (ref 0.2–1.0)
pH, UA: 7 (ref 5.0–7.5)

## 2024-03-17 LAB — MICROSCOPIC EXAMINATION
Bacteria, UA: NONE SEEN
Epithelial Cells (non renal): >10 /HPF — AB (ref 0–10)

## 2024-03-17 MED ORDER — CIPROFLOXACIN HCL 500 MG PO TABS
500.0000 mg | ORAL_TABLET | Freq: Once | ORAL | Status: AC
  Administered 2024-03-17: 500 mg via ORAL

## 2024-03-17 NOTE — Progress Notes (Signed)
   03/17/24  CC: followu bladder cancer   HPI: Raven Lopez is a 68yo here for followup for bladder cancer Blood pressure 99/66, pulse 76. NED. A&Ox3.   No respiratory distress   Abd soft, NT, ND Normal external genitalia with patent urethral meatus  Cystoscopy Procedure Note  Patient identification was confirmed, informed consent was obtained, and patient was prepped using Betadine solution.  Lidocaine  jelly was administered per urethral meatus.    Procedure: - Flexible cystoscope introduced, without any difficulty.   - Thorough search of the bladder revealed:    normal urethral meatus    normal urothelium    no stones    no ulcers     no tumors    no urethral polyps    no trabeculation  - Ureteral orifices were normal in position and appearance.  Post-Procedure: - Patient tolerated the procedure well  Assessment/ Plan: Followup 3 months for cystoscopy   No follow-ups on file.  Belvie Clara, MD

## 2024-03-23 ENCOUNTER — Encounter: Payer: Self-pay | Admitting: Urology

## 2024-03-23 NOTE — Patient Instructions (Signed)

## 2024-03-24 ENCOUNTER — Other Ambulatory Visit: Payer: Self-pay

## 2024-03-24 NOTE — Telephone Encounter (Signed)
 Pt called stating she is having a yeast infection and would like her Diflucan  sent in

## 2024-03-25 MED ORDER — FLUCONAZOLE 150 MG PO TABS: 150.0000 mg | ORAL_TABLET | Freq: Every day | ORAL | 0 refills | Status: DC

## 2024-04-05 ENCOUNTER — Ambulatory Visit (INDEPENDENT_AMBULATORY_CARE_PROVIDER_SITE_OTHER): Admitting: Physician Assistant

## 2024-04-05 ENCOUNTER — Encounter: Payer: Self-pay | Admitting: Physician Assistant

## 2024-04-05 VITALS — BP 94/64 | Ht 62.0 in | Wt 81.0 lb

## 2024-04-05 DIAGNOSIS — Z7689 Persons encountering health services in other specified circumstances: Secondary | ICD-10-CM

## 2024-04-05 DIAGNOSIS — M545 Low back pain, unspecified: Secondary | ICD-10-CM | POA: Diagnosis not present

## 2024-04-05 DIAGNOSIS — Z789 Other specified health status: Secondary | ICD-10-CM | POA: Insufficient documentation

## 2024-04-05 DIAGNOSIS — G8929 Other chronic pain: Secondary | ICD-10-CM | POA: Diagnosis not present

## 2024-04-05 DIAGNOSIS — Z13820 Encounter for screening for osteoporosis: Secondary | ICD-10-CM | POA: Diagnosis not present

## 2024-04-05 DIAGNOSIS — R64 Cachexia: Secondary | ICD-10-CM | POA: Insufficient documentation

## 2024-04-05 DIAGNOSIS — I952 Hypotension due to drugs: Secondary | ICD-10-CM | POA: Diagnosis not present

## 2024-04-05 DIAGNOSIS — R5381 Other malaise: Secondary | ICD-10-CM | POA: Insufficient documentation

## 2024-04-05 NOTE — Assessment & Plan Note (Signed)
 Patient presents today significantly underweight with a BMI of 14. Discussed high caloric, high protein diet. Discussed reintroducing Megace  for appetite stimulation, patient defers at this time.

## 2024-04-05 NOTE — Assessment & Plan Note (Signed)
 Chronic low back pain with history of lumbar fractures and trauma. Tylenol  provides partial relief. No recent imaging. - Order DEXA scan to assess bone density and check for fractures. - Continue Tylenol  for pain management. - Consider ibuprofen or naproxen for additional pain relief with food. - Refer to home health for physical and occupational therapy for general strengthening.

## 2024-04-05 NOTE — Progress Notes (Signed)
 New Patient Office Visit  Subjective    Patient ID: Raven Lopez, female    DOB: [birth year]  Age: 68 y.o. MRN: [id number]  CC:  Chief Complaint  Patient presents with   Establish Care    HPI Raven Lopez presents to establish care  Discussed the use of AI scribe software for clinical note transcription with the patient, who gave verbal consent to proceed.  History of Present Illness Raven Lopez is a 68 year old female with back pain and osteoporosis who presents with worsening back pain and low blood pressure.  She experiences worsening back pain over the past two months, located in the lower middle back, described as deep and persistent. The pain began after bending over to pick up a mop. She has scoliosis, spondylosis, and previous injuries to L1-L4 vertebrae from a fall in 2020. Tylenol  500 mg makes the pain bearable.  She feels weak and experiences low blood pressure. She is on losartan 25 mg and amlodipine 10 mg. She has a history of high blood pressure but now experiences dizziness and low blood pressure.  She reports a history of osteoporosis and has been on disability due to back problems. History of bladder cancer, not currently treated with chemo or radiation. Follows regularly with urology. History of anemia. No new chest pain, shortness of breath, or syncope. Patient reports baseline weight of 95 pounds prior to cancer. Admits struggles to gain weigh and eat 3 meals a day.     Outpatient Encounter Medications as of 04/05/2024  Medication Sig   acetaminophen  (TYLENOL ) 500 MG tablet Take 1,000 mg by mouth every 4 (four) hours as needed for mild pain or moderate pain.   dextrose  5 % SOLN 1,000 mL with fluorouracil  5 GM/100ML SOLN Inject into the vein.   diazepam  (VALIUM ) 10 MG tablet Take 10 mg by mouth in the morning and at bedtime.   fluconazole  (DIFLUCAN ) 150 MG tablet Take 1 tablet (150 mg total) by mouth daily.   losartan (COZAAR) 25 MG tablet Take 25 mg by  mouth every evening.   [DISCONTINUED] amLODipine (NORVASC) 10 MG tablet Take 10 mg by mouth at bedtime.   [DISCONTINUED] megestrol  (MEGACE ) 400 MG/10ML suspension Take 10 mLs (400 mg total) by mouth 2 (two) times daily.   [DISCONTINUED] MITOMYCIN  IV Inject into the vein.   [DISCONTINUED] nitrofurantoin , macrocrystal-monohydrate, (MACROBID ) 100 MG capsule Take 1 capsule (100 mg total) by mouth 2 (two) times daily.   [DISCONTINUED] ofloxacin (OCUFLOX) 0.3 % ophthalmic solution Place 2 drops into the right eye 4 (four) times daily.   [DISCONTINUED] prednisoLONE acetate (PRED FORTE) 1 % ophthalmic suspension Place into the right eye 4 (four) times daily.   No facility-administered encounter medications on file as of 04/05/2024.    Past Medical History:  Diagnosis Date   AC (acromioclavicular) joint bone spurs    Anxiety    Asthma    COPD (chronic obstructive pulmonary disease) (HCC)    DDD (degenerative disc disease), lumbar    Hypertension    Neck fracture (HCC)    Panic disorder    Scoliosis     Past Surgical History:  Procedure Laterality Date   BLADDER INSTILLATION N/A 08/29/2022   Procedure: BLADDER INSTILLATION;  Surgeon: Sherrilee Belvie CROME, MD;  Location: [facility];  Service: Urology;  Laterality: N/A;   CESAREAN SECTION     CYSTOSCOPY W/ RETROGRADES Bilateral 08/29/2022   Procedure: CYSTOSCOPY WITH RETROGRADE PYELOGRAM;  Surgeon: Sherrilee Belvie CROME, MD;  Location:  [facility];  Service: Urology;  Laterality: Bilateral;   IR IMAGING GUIDED PORT INSERTION  10/17/2022   TRANSURETHRAL RESECTION OF BLADDER TUMOR N/A 08/29/2022   Procedure: TRANSURETHRAL RESECTION OF BLADDER TUMOR (TURBT);  Surgeon: Sherrilee Belvie CROME, MD;  Location: [facility];  Service: Urology;  Laterality: N/A;   wisdom teeth removal      Family History  Problem Relation Age of Onset   Aneurysm Father    Kidney failure Mother    Hypertension Sister    Thyroid  disease Sister    Anxiety disorder Sister    Colitis Sister     Irritable bowel syndrome Sister     Social History   Socioeconomic History   Marital status: Widowed    Spouse name: Not on file   Number of children: Not on file   Years of education: Not on file   Highest education level: Not on file  Occupational History   Not on file  Tobacco Use   Smoking status: Every Day    Current packs/day: 1.00    Average packs/day: 1 pack/day for 40.0 years (40.0 ttl pk-yrs)    Types: Cigarettes   Smokeless tobacco: Never  Vaping Use   Vaping status: Never Used  Substance and Sexual Activity   Alcohol use: Not Currently   Drug use: Not Currently   Sexual activity: Not Currently    Birth control/protection: Post-menopausal  Other Topics Concern   Not on file  Social History Narrative   Not on file   Social Drivers of Health   Financial Resource Strain: Low Risk  (02/21/2022)   Overall Financial Resource Strain (CARDIA)    Difficulty of Paying Living Expenses: Not hard at all  Food Insecurity: No Food Insecurity (09/12/2022)   Hunger Vital Sign    Worried About Running Out of Food in the Last Year: Never true    Ran Out of Food in the Last Year: Never true  Transportation Needs: No Transportation Needs (09/12/2022)   PRAPARE - Administrator, Civil Service (Medical): No    Lack of Transportation (Non-Medical): No  Physical Activity: Inactive (02/21/2022)   Exercise Vital Sign    Days of Exercise per Week: 0 days    Minutes of Exercise per Session: 10 min  Stress: Stress Concern Present (02/21/2022)   [employer] of Occupational Health - Occupational Stress Questionnaire    Feeling of Stress : To some extent  Social Connections: Socially Isolated (02/21/2022)   Social Connection and Isolation Panel    Frequency of Communication with Friends and Family: More than three times a week    Frequency of Social Gatherings with Friends and Family: Twice a week    Attends Religious Services: Never    Database administrator or  Organizations: No    Attends Banker Meetings: Never    Marital Status: Widowed  Intimate Partner Violence: Not At Risk (09/12/2022)   Humiliation, Afraid, Rape, and Kick questionnaire    Fear of Current or Ex-Partner: No    Emotionally Abused: No    Physically Abused: No    Sexually Abused: No    Review of Systems  Constitutional:  Positive for malaise/fatigue and weight loss. Negative for fever.  Respiratory:  Negative for cough and shortness of breath.   Cardiovascular:  Negative for chest pain and palpitations.  Musculoskeletal:  Positive for back pain, falls and joint pain. Negative for myalgias.  Neurological:  Positive for dizziness. Negative for headaches.  Psychiatric/Behavioral:  The patient is nervous/anxious.         Objective    BP 94/64   Ht 5' 2 (1.575 m)   Wt 81 lb (36.7 kg)   BMI 14.82 kg/m   Physical Exam Constitutional:      Appearance: Normal appearance. She is cachectic.  HENT:     Head: Normocephalic and atraumatic.     Mouth/Throat:     Mouth: Mucous membranes are moist.     Pharynx: Oropharynx is clear.  Eyes:     Extraocular Movements: Extraocular movements intact.     Conjunctiva/sclera: Conjunctivae normal.  Cardiovascular:     Rate and Rhythm: Normal rate and regular rhythm.     Heart sounds: Murmur heard.  Pulmonary:     Effort: Pulmonary effort is normal.     Breath sounds: Normal breath sounds.  Musculoskeletal:     Right lower leg: No edema.     Left lower leg: No edema.  Skin:    General: Skin is warm and dry.  Neurological:     General: No focal deficit present.     Mental Status: She is alert and oriented to person, place, and time.  Psychiatric:        Mood and Affect: Mood normal.        Behavior: Behavior normal.       Assessment & Plan:  Encounter to establish care  Chronic midline low back pain without sciatica Assessment & Plan: Chronic low back pain with history of lumbar fractures and trauma.  Tylenol  provides partial relief. No recent imaging. - Order DEXA scan to assess bone density and check for fractures. - Continue Tylenol  for pain management. - Consider ibuprofen or naproxen for additional pain relief with food. - Refer to home health for physical and occupational therapy for general strengthening.   Cachexia Assessment & Plan: Patient presents today significantly underweight with a BMI of 14. Discussed high caloric, high protein diet. Discussed reintroducing Megace  for appetite stimulation, patient defers at this time.    Hypotension due to drugs Assessment & Plan: Patient presents today with ow blood pressure likely due to antihypertensive therapy. Chart review reveals multiple recent readings with systolic pressure less than 100 mmHg. Discontinued amlodipine to prevent hypotension and reduce fall risk. - Discontinue amlodipine. - Continue losartan 25 mg daily. - Monitor blood pressure at home, aiming for <130/80 mmHg. - Follow up in 4 weeks to monitor blood pressure.    Difficulty with activities of daily living -     AMB Referral [facility] Care Management  Physical deconditioning -     AMB Referral [facility] Care Management -     Ambulatory referral to Home Health  Screening for osteoporosis -     DG Bone Density   Return in about 4 weeks (around 05/03/2024) for BP.   Charmaine Katheryn Culliton, PA-C

## 2024-04-05 NOTE — Assessment & Plan Note (Signed)
 Patient presents today with ow blood pressure likely due to antihypertensive therapy. Chart review reveals multiple recent readings with systolic pressure less than 100 mmHg. Discontinued amlodipine to prevent hypotension and reduce fall risk. - Discontinue amlodipine. - Continue losartan 25 mg daily. - Monitor blood pressure at home, aiming for <130/80 mmHg. - Follow up in 4 weeks to monitor blood pressure.

## 2024-04-07 ENCOUNTER — Telehealth: Payer: Self-pay

## 2024-04-07 NOTE — Progress Notes (Unsigned)
 Complex Care Management Note Care Guide Note  04/07/2024 Name: MAKAYLYN SINYARD MRN: [id number] DOB: [birth year]   Complex Care Management Outreach Attempts: An unsuccessful telephone outreach was attempted today to offer the patient information about available complex care management services.  Follow Up Plan:  Additional outreach attempts will be made to offer the patient complex care management information and services.   Encounter Outcome:  No Answer  Jeoffrey Buffalo , RMA     [employer]  [facility], [facility] Guide  Direct Dial: [phone]  Website: [email or web address]

## 2024-04-09 ENCOUNTER — Telehealth: Payer: Self-pay | Admitting: *Deleted

## 2024-04-09 NOTE — Telephone Encounter (Signed)
 Copied from CRM [id number]. Topic: Clinical - Home Health Verbal Orders >> Apr 09, 2024 12:06 PM Nathanel BROCKS wrote: Caller/Agency: [facility] Callback Number: [phone] Service Requested: Physical Therapy Frequency: 1 week 1 2 week 3 1 week 3 Any new concerns about the patient? No

## 2024-04-09 NOTE — Progress Notes (Signed)
 Complex Care Management Note  Care Guide Note 04/09/2024 Name: Raven Lopez MRN: [id number] DOB: 11-02-1955  Raven Lopez is a 68 y.o. year old female who sees [facility], [place], [place] for primary care. I reached out to [name] by phone today to offer complex care management services.  [name] was given information about Complex Care Management services today including:   The Complex Care Management services include support from the care team which includes your Nurse Care Manager, Clinical Social Worker, or Pharmacist.  The Complex Care Management team is here to help remove barriers to the health concerns and goals most important to you. Complex Care Management services are voluntary, and the patient may decline or stop services at any time by request to their care team member.   Complex Care Management Consent Status: Patient agreed to services and verbal consent obtained.   Follow up plan:  Telephone appointment with complex care management team member scheduled for:  BSW 04/14/2024 RNCM 05/06/2024  Encounter Outcome:  Patient Scheduled  Jeoffrey Buffalo , RMA     [place]  [facility], [facility] Guide  Direct Dial: [phone]  Website: [email or web address]

## 2024-04-12 ENCOUNTER — Other Ambulatory Visit: Payer: Self-pay | Admitting: Physician Assistant

## 2024-04-12 DIAGNOSIS — R5381 Other malaise: Secondary | ICD-10-CM

## 2024-04-14 ENCOUNTER — Other Ambulatory Visit: Payer: Self-pay

## 2024-04-14 NOTE — Patient Instructions (Signed)
 Visit Information  Thank you for taking time to visit with me today. Please don't hesitate to contact me if I can be of assistance to you before our next scheduled appointment.  Our next appointment is by telephone on 04/21/24 at 2:30pm Please call the care guide team at [phone] if you need to cancel or reschedule your appointment.   Following is a copy of your care plan:   Goals Addressed             This Visit's Progress    BSW VBCI Social Work Care Plan       Problems:   Personal care  CSW Clinical Goal(s):   Over the next 7 days the Patient will will follow up with Personal Care Agencies as directed by Social Work.  Interventions:  Social Determinants of Health in Patient with Cancer: SDOH assessments completed: Personal Care Evaluation of current treatment plan related to unmet needs Patient reports she lives alone and her son does not help.  Patient needs help with meals, bathing and lite housekeeping.  T/c [employer] and insurance does not assist.  [employer] provided 4 options for private pay.  Patient does receive OTC $40 but has not used and will need to call Healthy Benefit department.  The call is dropped after a brief hold.  SW t/c [facility] but they do not provide personal care. SW t/c [facility] but they do not cover [facility].  SW t/c [employer] and left message for Roselie Nose to add patient to the waiting list for in home assistance.  Patient declined Meals on Wheels.  Patient reports that her sister takes her to the store.  Patient is emailed the 3 additional options from her insurance to call for personal care.  Patient Goals/Self-Care Activities:  Patient will follow up with options form insurance and await call from [employer].  Plan:   Telephone follow up appointment with care management team member scheduled for:  04/21/24 at 2:30        Please call 911 if you are experiencing a Mental Health or Behavioral Health Crisis or need someone to talk  to.  Patient verbalizes understanding of instructions and care plan provided today and agrees to view in MyChart. Active MyChart status and patient understanding of how to access instructions and care plan via MyChart confirmed with patient.     Tillman Gardener, BSW [place]  [facility], [facility] Social Worker Direct Dial: [phone]  Fax: [phone] Website: [email or web address]

## 2024-04-14 NOTE — Patient Outreach (Signed)
 Complex Care Management   Visit Note  04/14/2024  Name:  Raven Lopez MRN: [id number] DOB: [birth year]  Situation: Referral received for Complex Care Management related to SDOH Barriers:  personal care I obtained verbal consent from Patient.  Visit completed with Patient  on the phone  Background:   Past Medical History:  Diagnosis Date   AC (acromioclavicular) joint bone spurs    Anxiety    Asthma    COPD (chronic obstructive pulmonary disease) (HCC)    DDD (degenerative disc disease), lumbar    Hypertension    Neck fracture (HCC)    Panic disorder    Scoliosis     Assessment:  Patient reports she lives alone and her son does not help. Patient needs help with meals, bathing and lite housekeeping. T/c [employer] and insurance does not assist. [employer] provided 4 options for private pay. Patient does receive OTC $40 but has not used and will need to call Healthy Benefit department. The call is dropped after a brief hold. SW t/c [facility] but they do not provide personal care. SW t/c [facility] but they do not cover [facility]. SW t/c [employer] and left message for Roselie Nose to add patient to the waiting list for in home assistance. Patient declined Meals on Wheels. Patient reports that her sister takes her to the store. Patient is emailed the 3 additional options from her insurance to call for personal care.   SDOH Interventions    Flowsheet Row Patient Outreach Telephone from 04/14/2024 in [place] POPULATION HEALTH DEPARTMENT  SDOH Interventions   Food Insecurity Interventions Intervention Not Indicated  Housing Interventions Intervention Not Indicated  Transportation Interventions Intervention Not Indicated  [Sister and son assist with transportation]  Utilities Interventions Intervention Not Indicated  Financial Strain Interventions Intervention Not Indicated      Recommendation:   None  Follow Up Plan:   Telephone follow up appointment date/time:  04/21/24 at  2:30pm  Tillman Gardener, BSW [place]  [facility], [facility] Social Worker Direct Dial: [phone]  Fax: [phone] Website: [email or web address]

## 2024-04-16 ENCOUNTER — Encounter (HOSPITAL_COMMUNITY): Payer: Self-pay

## 2024-04-16 ENCOUNTER — Emergency Department (HOSPITAL_COMMUNITY)

## 2024-04-16 ENCOUNTER — Emergency Department (HOSPITAL_COMMUNITY)
Admission: EM | Admit: 2024-04-16 | Discharge: 2024-04-16 | Disposition: A | Discharge status: Home / Self Care | Attending: Emergency Medicine | Admitting: Emergency Medicine

## 2024-04-16 DIAGNOSIS — M8448XA Pathological fracture, other site, initial encounter for fracture: Secondary | ICD-10-CM | POA: Insufficient documentation

## 2024-04-16 DIAGNOSIS — I10 Essential (primary) hypertension: Secondary | ICD-10-CM | POA: Insufficient documentation

## 2024-04-16 DIAGNOSIS — Z79899 Other long term (current) drug therapy: Secondary | ICD-10-CM | POA: Insufficient documentation

## 2024-04-16 DIAGNOSIS — Z7951 Long term (current) use of inhaled steroids: Secondary | ICD-10-CM | POA: Insufficient documentation

## 2024-04-16 DIAGNOSIS — J4489 Other specified chronic obstructive pulmonary disease: Secondary | ICD-10-CM | POA: Insufficient documentation

## 2024-04-16 DIAGNOSIS — F17210 Nicotine dependence, cigarettes, uncomplicated: Secondary | ICD-10-CM | POA: Diagnosis not present

## 2024-04-16 DIAGNOSIS — Z8551 Personal history of malignant neoplasm of bladder: Secondary | ICD-10-CM | POA: Diagnosis not present

## 2024-04-16 DIAGNOSIS — M545 Low back pain, unspecified: Secondary | ICD-10-CM | POA: Diagnosis present

## 2024-04-16 DIAGNOSIS — S32050A Wedge compression fracture of fifth lumbar vertebra, initial encounter for closed fracture: Secondary | ICD-10-CM

## 2024-04-16 LAB — URINALYSIS, W/ REFLEX TO CULTURE (INFECTION SUSPECTED)
Bacteria, UA: NONE SEEN
Bilirubin Urine: NEGATIVE
Glucose, UA: NEGATIVE mg/dL
Hgb urine dipstick: NEGATIVE
Ketones, ur: NEGATIVE mg/dL
Leukocytes,Ua: NEGATIVE
Nitrite: NEGATIVE
Protein, ur: 30 mg/dL — AB
Specific Gravity, Urine: 1.019 (ref 1.005–1.030)
pH: 5 (ref 5.0–8.0)

## 2024-04-16 MED ORDER — OXYCODONE-ACETAMINOPHEN 5-325 MG PO TABS
1.0000 | ORAL_TABLET | Freq: Once | ORAL | Status: AC
  Administered 2024-04-16: 1 via ORAL
  Filled 2024-04-16: qty 1

## 2024-04-16 MED ORDER — OXYCODONE HCL 5 MG PO TABS: 5.0000 mg | ORAL_TABLET | Freq: Four times a day (QID) | ORAL | 0 refills | Status: DC | PRN

## 2024-04-16 MED ORDER — LIDOCAINE 5 % EX PTCH: 1.0000 | MEDICATED_PATCH | CUTANEOUS | 0 refills | Status: DC

## 2024-04-16 MED ORDER — OXYCODONE HCL 5 MG PO TABS
5.0000 mg | ORAL_TABLET | Freq: Once | ORAL | Status: AC
  Administered 2024-04-16: 5 mg via ORAL
  Filled 2024-04-16: qty 1

## 2024-04-16 NOTE — ED Triage Notes (Signed)
 Pt comes in for lower back that has been going to 2 months. Pt has a hx of lumbar fractures. Pt states she can't stand the pain anymore. Pt's last chemo txt was 12/10/2022. A&Ox4.

## 2024-04-16 NOTE — ED Notes (Signed)
 Out in w/c with RN, family present, denies questions or needs, VSS. Pain decreased. Encouraged close f/u with PCP, onc and nsurg

## 2024-04-16 NOTE — Discharge Instructions (Addendum)
 We evaluated you for your back pain.  Your CT scan does show a fracture of your L5 vertebra.  The radiologist saw a possible lesion causing the fracture.  This could be a tumor or metastatic disease.  We cannot say for sure until we have further testing.  We would like you to follow-up with your oncologist and we have placed a referral for expedited follow-up.  You can also follow-up with [place] [facility].  Please wear your back brace at home.  Please take Tylenol  (acetaminophen ) and Motrin (ibuprofen) for your symptoms at home.  You can take 1000 mg of Tylenol  every 6 hours and 600 mg of Motrin every 6 hours as needed for your symptoms.  You can take these medicines together as needed, either at the same time, or alternating every 3 hours.  We have given you a small amount of oxycodone  which you can take for pain not relieved by Tylenol  and Motrin.  Do not drink alcohol, drive, or operate machinery when taking this medicine.  Please return if you develop any new or worsening symptoms such as uncontrolled pain, trouble walking, weakness in your legs, incontinence, or any other new or concerning symptoms.

## 2024-04-16 NOTE — ED Notes (Signed)
 Denies sx other than pain, pain only.

## 2024-04-16 NOTE — ED Notes (Signed)
 Able to bear weight. Ambulatory, with slow cautious gait. Family and EDP present.

## 2024-04-16 NOTE — ED Notes (Signed)
 EDP at [facility]

## 2024-04-16 NOTE — ED Notes (Signed)
 Back from CT. Lying on stretcher in h/w. C/o back pain. Requesting pain med. Family x2 at [facility]. Alert, NAD, calm, interactive.

## 2024-04-16 NOTE — ED Provider Notes (Signed)
 [place] EMERGENCY DEPARTMENT AT [facility] Provider Note  CSN: [id number] Arrival date & time: 04/16/24 1418  Chief Complaint(s) Back Pain  HPI Raven Lopez is a 68 y.o. female history of COPD, bladder cancer presenting to the emergency department with low back pain.  Patient reports low back pain for 2 to 3 months but worsening.  No falls.  Has been taking Tylenol , Motrin which helps some.  No numbness or tingling, weakness in the legs, bowel or bladder incontinence, fevers or chills.  No urinary symptoms.   Past Medical History Past Medical History:  Diagnosis Date   AC (acromioclavicular) joint bone spurs    Anxiety    Asthma    COPD (chronic obstructive pulmonary disease) (HCC)    DDD (degenerative disc disease), lumbar    Hypertension    Neck fracture (HCC)    Panic disorder    Scoliosis    Patient Active Problem List   Diagnosis Date Noted   Cachexia 04/05/2024   Difficulty with activities of daily living 04/05/2024   Physical deconditioning 04/05/2024   Chronic midline low back pain without sciatica 04/05/2024   Hypotension due to drugs 04/05/2024   Malignant neoplasm of urinary bladder (HCC) 07/29/2022   Home Medication(s) Prior to Admission medications   Medication Sig Start Date End Date Taking? Authorizing Provider  lidocaine  (LIDODERM ) 5 % Place 1 patch onto the skin daily. Remove & Discard patch within 12 hours or as directed by MD 04/16/24  Yes Francesca Elsie CROME, MD  oxyCODONE  (ROXICODONE ) 5 MG immediate release tablet Take 1 tablet (5 mg total) by mouth every 6 (six) hours as needed for severe pain (pain score 7-10). 04/16/24  Yes Francesca Elsie CROME, MD  acetaminophen  (TYLENOL ) 500 MG tablet Take 1,000 mg by mouth every 4 (four) hours as needed for mild pain or moderate pain.    [provider]  dextrose  5 % SOLN 1,000 mL with fluorouracil  5 GM/100ML SOLN Inject into the vein. 10/30/22   [provider]  diazepam  (VALIUM ) 10  MG tablet Take 10 mg by mouth in the morning and at bedtime.    [provider]  fluconazole  (DIFLUCAN ) 150 MG tablet Take 1 tablet (150 mg total) by mouth daily. 03/25/24   McKenzie, Belvie CROME, MD  losartan (COZAAR) 25 MG tablet Take 25 mg by mouth every evening. 12/14/21   [provider]                                                                                                                                    Past Surgical History Past Surgical History:  Procedure Laterality Date   BLADDER INSTILLATION N/A 08/29/2022   Procedure: BLADDER INSTILLATION;  Surgeon: Sherrilee Belvie CROME, MD;  Location: [facility];  Service: Urology;  Laterality: N/A;   CESAREAN SECTION     CYSTOSCOPY W/ RETROGRADES Bilateral 08/29/2022   Procedure: CYSTOSCOPY WITH RETROGRADE PYELOGRAM;  Surgeon: Sherrilee Belvie CROME, MD;  Location: [facility];  Service: Urology;  Laterality: Bilateral;   IR IMAGING GUIDED PORT INSERTION  10/17/2022   TRANSURETHRAL RESECTION OF BLADDER TUMOR N/A 08/29/2022   Procedure: TRANSURETHRAL RESECTION OF BLADDER TUMOR (TURBT);  Surgeon: Sherrilee Belvie CROME, MD;  Location: [facility];  Service: Urology;  Laterality: N/A;   wisdom teeth removal     Family History Family History  Problem Relation Age of Onset   Aneurysm Father    Kidney failure Mother    Hypertension Sister    Thyroid  disease Sister    Anxiety disorder Sister    Colitis Sister    Irritable bowel syndrome Sister     Social History Social History   Tobacco Use   Smoking status: Every Day    Current packs/day: 1.00    Average packs/day: 1 pack/day for 40.0 years (40.0 ttl pk-yrs)    Types: Cigarettes   Smokeless tobacco: Never  Vaping Use   Vaping status: Never Used  Substance Use Topics   Alcohol use: Not Currently   Drug use: Not Currently   Allergies Penicillins, Aspirin, Demerol [meperidine hcl], and Sulfonamide derivatives  Review of Systems Review of Systems  All other systems reviewed and  are negative.   Physical Exam Vital Signs  I have reviewed the triage vital signs BP (!) 145/63 (BP Location: Right Arm)   Pulse 77   Temp 98.5 F (36.9 C) (Oral)   Resp 18   Ht 5' 2 (1.575 m)   Wt 36.7 kg   SpO2 100%   BMI 14.81 kg/m  Physical Exam Vitals and nursing note reviewed.  Constitutional:      General: She is not in acute distress.    Appearance: She is well-developed.  HENT:     Head: Normocephalic and atraumatic.     Mouth/Throat:     Mouth: Mucous membranes are moist.  Eyes:     Pupils: Pupils are equal, round, and reactive to light.  Cardiovascular:     Rate and Rhythm: Normal rate and regular rhythm.     Heart sounds: No murmur heard. Pulmonary:     Effort: Pulmonary effort is normal. No respiratory distress.     Breath sounds: Normal breath sounds.  Abdominal:     General: Abdomen is flat.     Palpations: Abdomen is soft.     Tenderness: There is no abdominal tenderness.  Musculoskeletal:        General: No tenderness.     Right lower leg: No edema.     Left lower leg: No edema.     Comments: Strength 5 out of 5 in the bilateral lower extremities, no sensory deficits to light touch, mild lower lumbar midline tenderness to palpation, no step-off.  Ambulatory with slightly antalgic gait  Skin:    General: Skin is warm and dry.  Neurological:     General: No focal deficit present.     Mental Status: She is alert. Mental status is at baseline.  Psychiatric:        Mood and Affect: Mood normal.        Behavior: Behavior normal.     ED Results and Treatments Labs (all labs ordered are listed, but only abnormal results are displayed) Labs Reviewed  URINALYSIS, W/ REFLEX TO CULTURE (INFECTION SUSPECTED)  Radiology CT Lumbar Spine Wo Contrast Result Date: 04/16/2024 EXAM: CT OF THE LUMBAR SPINE WITHOUT CONTRAST 04/16/2024  04:23:35 PM TECHNIQUE: CT of the lumbar spine was performed without the administration of intravenous contrast. Multiplanar reformatted images are provided for review. Automated exposure control, iterative reconstruction, and/or weight based adjustment of the mA/kV was utilized to reduce the radiation dose to as low as reasonably achievable. COMPARISON: CT of the abdomen and pelvis 06/23/2023 CLINICAL HISTORY: Low back pain, increased fracture risk. Lower back pain for 2 months. History of lumbar fractures. Patient states she can't stand the pain anymore. Patient's last chemo treatment was 12/10/2022. FINDINGS: BONES AND ALIGNMENT: Remote superior endplate fractures and Schmorl's nodes are present at T12, L1, and L2. A remote superior endplate fracture at L3 is stable with loss of height greatest on the left. An acute fracture is present on the left superior endplate of L5. A lytic lesion is present along the anterior and lateral margin of L5. Soft tissue components extend beyond the bone on the left at L5. No other focal osseous lesions are present. Normal alignment. DEGENERATIVE CHANGES: Remote superior endplate fractures and Schmorl's nodes are present at T12, L1, and L2. A remote superior endplate fracture at L3 is stable with loss of height greatest on the left. An acute fracture is present on the left superior endplate of L5. A lytic lesion is present along the anterior and lateral margin of L5. Soft tissue components extend beyond the bone on the left at L5. SOFT TISSUES: Soft tissue components extend beyond the bone on the left at L5, associated with the lytic lesion. Atherosclerotic calcifications are present in the aorta without aneurysm. IMPRESSION: 1. Acute fracture of the left superior endplate of L5 with associated lytic lesion and extraosseous soft tissue extension, suspicious for pathologic fracture. 2. Stable remote superior endplate fracture at L3 with asymmetric loss of height greatest on the  left. Acute findings were called to the Dr. Francesca at 4:45 pm. Electronically signed by: Lonni Necessary MD 04/16/2024 04:49 PM EDT RP Workstation: [id number]    Pertinent labs & imaging results that were available during my care of the patient were reviewed by me and considered in my medical decision making (see MDM for details).  Medications Ordered in ED Medications  oxyCODONE  (Oxy IR/ROXICODONE ) immediate release tablet 5 mg (has no administration in time range)  oxyCODONE -acetaminophen  (PERCOCET/ROXICET) 5-325 MG per tablet 1 tablet (1 tablet Oral Given 04/16/24 1632)                                                                                                                                     Procedures Procedures  (including critical care time)  Medical Decision Making / ED Course   MDM:  68 year old presenting to the emergency department low back pain.  Patient overall well-appearing, vital signs reassuring.  Exam with some lumbar tenderness but able to ambulate.  Given history, concern  for possible underlying compression fracture, CT lumbar spine was obtained which does show compression fracture to L5 which is new.  Also possible lytic lesion, could be possible pathologic fracture.  Given history of bladder cancer, recommended follow-up with oncology.  Patient reports she already has an LSO brace from prior fracture, recommended she continue this.  Recommended follow-up with neurosurgery also given lumbar fracture.  Patient is neurologically intact.  Very low concern for other underlying process such as spinal cord compression or occult infectious process.  Feel patient stable for discharge.  Her pain did improve after oxycodone .  She would prefer to go home but did discuss possible admission for PT OT. Will discharge patient to home. All questions answered. Patient comfortable with plan of discharge. Return precautions discussed with patient and specified on the after  visit summary.       Additional history obtained: -Additional history obtained from family -External records from outside source obtained and reviewed including: Chart review including previous notes, labs, imaging, consultation notes including prior notes   Imaging Studies ordered: I ordered imaging studies including CT lumbar spine  On my interpretation imaging demonstrates L5 fx  I independently visualized and interpreted imaging. I agree with the radiologist interpretation   Medicines ordered and prescription drug management: Meds ordered this encounter  Medications   oxyCODONE -acetaminophen  (PERCOCET/ROXICET) 5-325 MG per tablet 1 tablet    Refill:  0   oxyCODONE  (Oxy IR/ROXICODONE ) immediate release tablet 5 mg    Refill:  0   oxyCODONE  (ROXICODONE ) 5 MG immediate release tablet    Sig: Take 1 tablet (5 mg total) by mouth every 6 (six) hours as needed for severe pain (pain score 7-10).    Dispense:  15 tablet    Refill:  0   lidocaine  (LIDODERM ) 5 %    Sig: Place 1 patch onto the skin daily. Remove & Discard patch within 12 hours or as directed by MD    Dispense:  30 patch    Refill:  0    -I have reviewed the patients home medicines and have made adjustments as needed  Social Determinants of Health:  Diagnosis or treatment significantly limited by social determinants of health: lives alone   Reevaluation: After the interventions noted above, I reevaluated the patient and found that their symptoms have improved  Co morbidities that complicate the patient evaluation  Past Medical History:  Diagnosis Date   AC (acromioclavicular) joint bone spurs    Anxiety    Asthma    COPD (chronic obstructive pulmonary disease) (HCC)    DDD (degenerative disc disease), lumbar    Hypertension    Neck fracture (HCC)    Panic disorder    Scoliosis       Dispostion: Disposition decision including need for hospitalization was considered, and patient discharged from  emergency department.    Final Clinical Impression(s) / ED Diagnoses Final diagnoses:  Compression fracture of L5 vertebra, initial encounter [facility])  Pathological fracture of vertebra, unspecified pathological cause, initial encounter     This chart was dictated using voice recognition software.  Despite best efforts to proofread,  errors can occur which can change the documentation meaning.    Francesca Elsie CROME, MD 04/16/24 [phone]

## 2024-04-21 ENCOUNTER — Ambulatory Visit: Payer: Self-pay

## 2024-04-21 ENCOUNTER — Other Ambulatory Visit: Payer: Self-pay

## 2024-04-21 NOTE — Patient Instructions (Addendum)
 Visit Information  Thank you for taking time to visit with me today. Please don't hesitate to contact me if I can be of assistance to you before our next scheduled appointment.  Your next care management appointment is by telephone on 04/26/24 at 11am   Please call the care guide team at [phone] if you need to cancel, schedule, or reschedule an appointment.   Please call 911 if you are experiencing a Mental Health or Behavioral Health Crisis or need someone to talk to.  Tillman Gardener, BSW [place]  [facility], [facility] Social Worker Direct Dial: [phone]  Fax: [phone] Website: [email or web address]

## 2024-04-21 NOTE — Telephone Encounter (Signed)
 FYI Only or Action Required?: FYI only for provider.  Patient was last seen in primary care on 04/05/2024 by [place], [place], [place].  Called Nurse Triage reporting Back Pain.  Symptoms began several weeks ago.  Interventions attempted: Prescription medications: percocet.  Symptoms are: gradually worsening.  Triage Disposition: See PCP Within 2 Weeks  Patient/caregiver understands and will follow disposition?: Yes  Copied from CRM #[id number]. Topic: Clinical - Red Word Triage >> Apr 21, 2024  2:59 PM Turkey B wrote: [facility] that prompted transfer to Nurse Triage: Patient has severe back pain Reason for Disposition  Back pain present > 2 weeks  Answer Assessment - Initial Assessment Questions L4 was compressed, possibly L5. Has been having back pain consistently the last few weeks. Was taking percocet but it makes her stomach upset. Would like to see provider for chronic pain.   1. ONSET: When did the pain begin? (e.g., minutes, hours, days)     september 2. LOCATION: Where does it hurt? (upper, mid or lower back)     Lower back 3. SEVERITY: How bad is the pain?  (e.g., Scale 1-10; mild, moderate, or severe)     9/10 4. PATTERN: Is the pain constant? (e.g., yes, no; constant, intermittent)      constant 5. RADIATION: Does the pain shoot into your legs or somewhere else?     Left leg 6. CAUSE:  What do you think is causing the back pain?      Compression in L4-5 7. BACK OVERUSE:  Any recent lifting of heavy objects, strenuous work or exercise?     denies 8. MEDICINES: What have you taken so far for the pain? (e.g., nothing, acetaminophen , NSAIDS)     Apap advil 9. NEUROLOGIC SYMPTOMS: Do you have any weakness, numbness, or problems with bowel/bladder control?     denies 10. OTHER SYMPTOMS: Do you have any other symptoms? (e.g., fever, abdomen pain, burning with urination, blood in urine)       denies  Protocols used: Back Pain-A-AH

## 2024-04-21 NOTE — Patient Outreach (Addendum)
 Complex Care Management   Visit Note  04/21/2024  Name:  Raven Lopez MRN: [id number] DOB: 12/05/1955  Situation: Referral received for Complex Care Management related to SDOH Barriers:  Home Health I obtained verbal consent from Patient.  Visit completed with Patient  on the phone  Background:   Past Medical History:  Diagnosis Date   AC (acromioclavicular) joint bone spurs    Anxiety    Asthma    COPD (chronic obstructive pulmonary disease) (HCC)    DDD (degenerative disc disease), lumbar    Hypertension    Neck fracture (HCC)    Panic disorder    Scoliosis     Assessment:  Patient reports she has not received a call from [facility] and was at the hospital. Patient called a Home Health agency but was told she would have to pay out of pocket. Patient is not feeling well and is having issues with medication making her sick. SW T/c providers office to connect patient with a nurse.   SDOH Interventions    Flowsheet Row Patient Outreach Telephone from 04/14/2024 in [place] POPULATION HEALTH DEPARTMENT  SDOH Interventions   Food Insecurity Interventions Intervention Not Indicated  Housing Interventions Intervention Not Indicated  Transportation Interventions Intervention Not Indicated  [Sister and son assist with transportation]  Utilities Interventions Intervention Not Indicated  Financial Strain Interventions Intervention Not Indicated    Recommendation:   SW contacted provider office with patient on the phone to inform of patient concerns regarding medical issues after hospital visit with medications.    Follow Up Plan:   Telephone follow up appointment date/time:  04/26/24 at 11am  Tillman Gardener, BSW [place]  [facility], [facility] Social Worker Direct Dial: [phone]  Fax: [phone] Website: [email or web address]

## 2024-04-22 ENCOUNTER — Encounter: Payer: Self-pay | Admitting: *Deleted

## 2024-04-22 ENCOUNTER — Telehealth: Payer: Self-pay | Admitting: *Deleted

## 2024-04-22 NOTE — Telephone Encounter (Signed)
 Patient notified of provider's recommendations via my chart

## 2024-04-22 NOTE — Telephone Encounter (Signed)
 Appointment scheduled.

## 2024-04-22 NOTE — Telephone Encounter (Signed)
 Copied from CRM [id number]. Topic: Clinical - Medication Question >> Apr 21, 2024  2:56 PM Turkey B wrote: Reason for CRM: Patient wants to know the difference between percocet and Oxychodone she says she has taken both 8 hours apart

## 2024-04-22 NOTE — Telephone Encounter (Signed)
 Copied from CRM [id number]. Topic: Clinical - Home Health Verbal Orders >> Apr 22, 2024  1:52 PM Jasmin G wrote: Caller/Agency: Ms. Greig from [facility] Callback Number: [phone] Service Requested: Hold for Physical Therapy requested by son Frequency: 2 weeks Any new concerns about the patient? No

## 2024-04-23 ENCOUNTER — Other Ambulatory Visit (HOSPITAL_COMMUNITY)

## 2024-04-26 ENCOUNTER — Telehealth: Payer: Self-pay

## 2024-04-26 ENCOUNTER — Other Ambulatory Visit: Payer: Self-pay

## 2024-04-26 ENCOUNTER — Ambulatory Visit: Payer: Self-pay | Admitting: Physician Assistant

## 2024-04-26 ENCOUNTER — Encounter: Payer: Self-pay | Admitting: Physician Assistant

## 2024-04-26 VITALS — BP 114/81 | HR 83 | Temp 97.5°F | Ht 62.0 in | Wt 81.0 lb

## 2024-04-26 DIAGNOSIS — I952 Hypotension due to drugs: Secondary | ICD-10-CM

## 2024-04-26 DIAGNOSIS — S32050A Wedge compression fracture of fifth lumbar vertebra, initial encounter for closed fracture: Secondary | ICD-10-CM

## 2024-04-26 MED ORDER — OXYCODONE HCL 5 MG PO TABS: 5.0000 mg | ORAL_TABLET | ORAL | 0 refills | Status: DC | PRN

## 2024-04-26 NOTE — Assessment & Plan Note (Signed)
 114/81 Controlled without medications.  No change in management. Follow up in 1 month.

## 2024-04-26 NOTE — Patient Instructions (Signed)
 Please call Raven Lopez [facility]/Bone density scheduling [phone]

## 2024-04-26 NOTE — Patient Outreach (Signed)
 Complex Care Management   Visit Note  04/26/2024  Name:  Raven Lopez MRN: [id number] DOB: [birth year]  Situation: Referral received for Complex Care Management related to SDOH Barriers:  Personal care I obtained verbal consent from Patient.  Visit completed with Patient  on the phone  Background:   Past Medical History:  Diagnosis Date   AC (acromioclavicular) joint bone spurs    Anxiety    Asthma    COPD (chronic obstructive pulmonary disease) (HCC)    DDD (degenerative disc disease), lumbar    Hypertension    Neck fracture (HCC)    Panic disorder    Scoliosis     Assessment:  Patient reports she did not receive a return call from [facility]. SW did leave another message with [facility]. SW offered to call again but patient reports her sister is about to arrive to help her get dressed. Patient requested SW call again to follow up.   SDOH Interventions    Flowsheet Row Patient Outreach Telephone from 04/14/2024 in [place] POPULATION HEALTH DEPARTMENT  SDOH Interventions   Food Insecurity Interventions Intervention Not Indicated  Housing Interventions Intervention Not Indicated  Transportation Interventions Intervention Not Indicated  [Sister and son assist with transportation]  Utilities Interventions Intervention Not Indicated  Financial Strain Interventions Intervention Not Indicated      Recommendation:   none  Follow Up Plan:   Telephone follow up appointment date/time:  04/29/24 at 1pm  Tillman Gardener, BSW [place]  [facility], [facility] Social Worker Direct Dial: [phone]  Fax: [phone] Website: [email or web address]

## 2024-04-26 NOTE — Progress Notes (Signed)
 Established Patient Office Visit  Subjective   Patient ID: Raven Lopez, female    DOB: May 27, 1956  Age: 68 y.o. MRN: [id number]  Chief Complaint  Patient presents with   Medication Refill    For lumbar spine comp fx     Discussed the use of AI scribe software for clinical note transcription with the patient, who gave verbal consent to proceed.  History of Present Illness Raven Lopez is a 68 year old female ER follow up regarding L5 compression fracture.  She has experienced significant back pain for two months, worsening after a golf cart ride. An emergency room visit revealed a compression fracture at L5 with a lytic lesion. Previous compression fractures reported at L1, L2, L3, and L4 in 2020. Patient with history of bladder cancer.   Pain management includes Percocet in the ER and oxycodone  prescribed at discharge, with the last dose taken at 2 PM before the visit. She experiences mild nausea with pain medication. An LSO back brace provides some relief, and she uses Tylenol  as needed. She inquires about lidocaine  patches for additional relief.  There is concern that the lytic lesion at L5 may relate to her bladder cancer history. She is scheduled to see her oncologist tomorrow.  She reports no numbness or tingling in her legs. Physical therapy was paused due to her condition, and she is unable to perform activities such as walking across the floor or lifting objects.   Review of Systems  Constitutional:  Positive for malaise/fatigue. Negative for fever.  Genitourinary:  Negative for dysuria.  Musculoskeletal:  Positive for back pain.  Neurological:  Negative for sensory change, focal weakness and weakness.      Objective:     BP 114/81   Pulse 83   Temp (!) 97.5 F (36.4 C)   Ht 5' 2 (1.575 m)   Wt 81 lb (36.7 kg)   SpO2 98%   BMI 14.82 kg/m    Physical Exam Constitutional:      General: She is not in acute distress.    Appearance: Normal appearance. She  is cachectic. She is ill-appearing (chronically ill appearing).  HENT:     Head: Normocephalic and atraumatic.     Mouth/Throat:     Mouth: Mucous membranes are moist.     Pharynx: Oropharynx is clear.  Eyes:     Extraocular Movements: Extraocular movements intact.     Conjunctiva/sclera: Conjunctivae normal.  Cardiovascular:     Rate and Rhythm: Normal rate and regular rhythm.     Heart sounds: Normal heart sounds. No murmur heard. Pulmonary:     Effort: Pulmonary effort is normal.     Breath sounds: Normal breath sounds.  Musculoskeletal:     Lumbar back: Tenderness and bony tenderness present. No swelling or edema. Decreased range of motion.  Skin:    General: Skin is warm and dry.  Neurological:     General: No focal deficit present.     Mental Status: She is alert and oriented to person, place, and time.     Sensory: No sensory deficit.     Motor: Weakness present.     Gait: Gait abnormal.  Psychiatric:        Mood and Affect: Mood normal.        Behavior: Behavior normal.     No results found for any visits on 04/26/24.  The ASCVD Risk score (Arnett DK, et al., 2019) failed to calculate for the following reasons:  Cannot find a previous HDL lab   Cannot find a previous total cholesterol lab    Assessment & Plan:   Return in about 4 weeks (around 05/24/2024) for pain.   Compression fracture of L5 vertebra, initial encounter [facility]) Assessment & Plan: Compression fracture with lytic lesion on L5. Differential includes osteoporosis and potential metastasis from bladder cancer. No neurological deficits. Pain management prioritized. - Continue back brace when mobile. - Refill oxycodone  for pain. - Advised acetaminophen  between oxycodone  doses. - Message oncologist regarding lytic lesion for further evaluation. - Consider neurosurgery referral if needed. - Warning signs and follow up precautions discussed.   Orders: -     oxyCODONE  HCl; Take 1 tablet (5 mg total)  by mouth every 4 (four) hours as needed for severe pain (pain score 7-10).  Dispense: 30 tablet; Refill: 0  Hypotension due to drugs Assessment & Plan: 114/81 Controlled without medications.  No change in management. Follow up in 1 month.       Charmaine Rella [place], PA-C

## 2024-04-26 NOTE — Assessment & Plan Note (Signed)
 Compression fracture with lytic lesion on L5. Differential includes osteoporosis and potential metastasis from bladder cancer. No neurological deficits. Pain management prioritized. - Continue back brace when mobile. - Refill oxycodone  for pain. - Advised acetaminophen  between oxycodone  doses. - Message oncologist regarding lytic lesion for further evaluation. - Consider neurosurgery referral if needed. - Warning signs and follow up precautions discussed.

## 2024-04-26 NOTE — Telephone Encounter (Signed)
 Copied from CRM #[id number]. Topic: Clinical - Home Health Verbal Orders >> Apr 26, 2024 10:49 AM [name] wrote: Caller[facility]Agency[facility]  Callback Number: [phone]  Service Requested:   Hold PT x 2 week per her sons request  Frequency:  Any new concerns about the patient? No

## 2024-04-26 NOTE — Patient Instructions (Signed)
 Visit Information  Thank you for taking time to visit with me today. Please don't hesitate to contact me if I can be of assistance to you before our next scheduled appointment.  Your next care management appointment is by telephone on 04/29/24 at 1pm  Please call the care guide team at [phone] if you need to cancel, schedule, or reschedule an appointment.   Please call 911 if you are experiencing a Mental Health or Behavioral Health Crisis or need someone to talk to.  Tillman Gardener, BSW [place]  [facility], [facility] Social Worker Direct Dial: [phone]  Fax: [phone] Website: [email or web address]

## 2024-04-27 ENCOUNTER — Other Ambulatory Visit (HOSPITAL_COMMUNITY): Payer: Self-pay

## 2024-04-27 ENCOUNTER — Inpatient Hospital Stay: Attending: Hematology | Admitting: Oncology

## 2024-04-27 ENCOUNTER — Telehealth: Payer: Self-pay | Admitting: Pharmacy Technician

## 2024-04-27 NOTE — Telephone Encounter (Signed)
 Pharmacy Patient Advocate Encounter   Received notification from CoverMyMeds that prior authorization for oxyCODONE  HCl 5MG  tablets is required/requested.   Insurance verification completed.   The patient is insured through [place].   Per test claim: Refill too soon. PA is not needed at this time. Medication was filled 04/26/2024. Next eligible fill date is 04/30/2024.

## 2024-04-29 ENCOUNTER — Telehealth

## 2024-04-29 NOTE — Patient Instructions (Signed)
 [name] - I am sorry I was unable to reach you today for our scheduled appointment. I work with Grooms, [place], PA-C and am calling to support your healthcare needs. Please contact me at [phone] at your earliest convenience. I look forward to speaking with you soon.   Thank you,  Tillman Gardener, BSW [place]  [facility], [facility] Social Worker Direct Dial: [phone]  Fax: [phone] Website: [email or web address]

## 2024-05-04 ENCOUNTER — Other Ambulatory Visit: Payer: Self-pay

## 2024-05-04 ENCOUNTER — Telehealth: Payer: Self-pay

## 2024-05-04 NOTE — Telephone Encounter (Signed)
 Copied from CRM #[id number]. Topic: Clinical - Home Health Verbal Orders >> Apr 26, 2024 10:49 AM [name] wrote: Caller/Agency: [facility]  Callback Number: [phone]  Service Requested:   Hold PT x 2 week per her sons request  Frequency:  Any new concerns about the patient? No >> May 03, 2024  1:50 PM [name] wrote: Raven Lopez [phone] calling to about her calls to get verbal order to hold pt for 2 weeks per sons request. She has not rcvd a call back and she is calling to ck on it.

## 2024-05-04 NOTE — Patient Outreach (Signed)
 Complex Care Management   Visit Note  05/04/2024  Name:  Raven Lopez MRN: [id number] DOB: October 13, 1955  Situation: Referral received for Complex Care Management related to SDOH Barriers:  Home Health I obtained verbal consent from Patient.  Visit completed with Patient  on the phone  Background:   Past Medical History:  Diagnosis Date   AC (acromioclavicular) joint bone spurs    Anxiety    Asthma    COPD (chronic obstructive pulmonary disease) (HCC)    DDD (degenerative disc disease), lumbar    Hypertension    Neck fracture (HCC)    Panic disorder    Scoliosis     Assessment:  SW communicated with Roselie at [facility] and patient has been added to the waiting list for In Home Aide. Patient reports she was not provided how long the wait will be for In Home Aide. Patient reports her son, who lives next door, has agreed to help more and is with her. Patient is willing to consider self pay for Personal Care. Patient did not follow up with resources for Personal Care. SW re-sent the email and added [facility]. Patient will have son review email and assist with connecting with Home Health Agencies.   SDOH Interventions    Flowsheet Row Patient Outreach Telephone from 04/14/2024 in [place] POPULATION HEALTH DEPARTMENT  SDOH Interventions   Food Insecurity Interventions Intervention Not Indicated  Housing Interventions Intervention Not Indicated  Transportation Interventions Intervention Not Indicated  [Sister and son assist with transportation]  Utilities Interventions Intervention Not Indicated  Financial Strain Interventions Intervention Not Indicated      Recommendation:   None  Follow Up Plan:   Telephone follow up appointment date/time:  05/18/24 at 1pm  Tillman Gardener, BSW [place]  [facility], [facility] Social Worker Direct Dial: [phone]  Fax: [phone] Website: [email or web address]

## 2024-05-04 NOTE — Patient Instructions (Signed)
 Visit Information  Thank you for taking time to visit with me today. Please don't hesitate to contact me if I can be of assistance to you before our next scheduled appointment.  Your next care management appointment is by telephone on 05/18/24 at 1pm    Please call the care guide team at [phone] if you need to cancel, schedule, or reschedule an appointment.   Please call 911 if you are experiencing a Mental Health or Behavioral Health Crisis or need someone to talk to.  Tillman Gardener, BSW [place]  [facility], [facility] Social Worker Direct Dial: [phone]  Fax: [phone] Website: [email or web address]

## 2024-05-05 ENCOUNTER — Telehealth: Payer: Self-pay

## 2024-05-05 ENCOUNTER — Ambulatory Visit: Admitting: Physician Assistant

## 2024-05-05 NOTE — Telephone Encounter (Signed)
 Copied from CRM #[id number]. Topic: Clinical - Home Health Verbal Orders >> Apr 26, 2024 10:49 AM [name] wrote: Caller[facility]: [facility]  Callback Number: [phone]  Service Requested:   Hold PT x 2 week per her sons request  Frequency:  Any new concerns about the patient? No >> May 04, 2024 12:00 PM Winona R wrote: Amy reaching out to follow up with holding PT/ I have informed her Grooms gave the ok for two weeks as per the encounter  >> May 03, 2024  1:50 PM [name] wrote: [facility] Corean Salome [phone] calling to about her calls to get verbal order to hold pt for 2 weeks per sons request. She has not rcvd a call back and she is calling to ck on it.

## 2024-05-06 ENCOUNTER — Other Ambulatory Visit: Payer: Self-pay | Admitting: *Deleted

## 2024-05-06 ENCOUNTER — Other Ambulatory Visit: Payer: Self-pay | Admitting: Physician Assistant

## 2024-05-06 DIAGNOSIS — S32050A Wedge compression fracture of fifth lumbar vertebra, initial encounter for closed fracture: Secondary | ICD-10-CM

## 2024-05-06 NOTE — Patient Outreach (Signed)
 Complex Care Management   Visit Note  05/06/2024  Name:  Raven Lopez MRN: [id number] DOB: Jun 22, 1956  Situation: Referral received for Complex Care Management related to Physical Deconditioning I obtained verbal consent from Patient.  Visit completed with Patient  on the phone  Background:   Past Medical History:  Diagnosis Date   AC (acromioclavicular) joint bone spurs    Anxiety    Asthma    COPD (chronic obstructive pulmonary disease) (HCC)    DDD (degenerative disc disease), lumbar    Hypertension    Neck fracture (HCC)    Panic disorder    Scoliosis     Assessment: Patient Reported Symptoms:  Cognitive Cognitive Status: No symptoms reported Cognitive/Intellectual Conditions Management [RPT]: None reported or documented in medical history or problem list   Health Maintenance Behaviors: Annual physical exam Healing Pattern: Average Health Facilitated by: Rest  Neurological Neurological Review of Symptoms: Weakness Neurological Self-Management Outcome: 3 (uncertain)  HEENT HEENT Symptoms Reported: No symptoms reported      Cardiovascular Cardiovascular Symptoms Reported: No symptoms reported Does patient have uncontrolled Hypertension?: No Cardiovascular Self-Management Outcome: 4 (good)  Respiratory Respiratory Symptoms Reported: No symptoms reported    Endocrine Endocrine Symptoms Reported: No symptoms reported Is patient diabetic?: No Endocrine Self-Management Outcome: 4 (good)  Gastrointestinal Gastrointestinal Symptoms Reported: No symptoms reported      Genitourinary Genitourinary Symptoms Reported: No symptoms reported    Integumentary Integumentary Symptoms Reported: No symptoms reported    Musculoskeletal Musculoskelatal Symptoms Reviewed: Back pain Musculoskeletal Management Strategies: Coping strategies Musculoskeletal Self-Management Outcome: 3 (uncertain) Falls in the past year?: Yes Number of falls in past year: 1 or less Was there an injury  with Fall?: Yes Fall Risk Category Calculator: 2 Patient Fall Risk Level: Moderate Fall Risk Patient at Risk for Falls Due to: History of fall(s), Impaired balance/gait Fall risk Follow up: Falls evaluation completed, Education provided  Psychosocial Psychosocial Symptoms Reported: Depression - if selected complete PHQ 2-9 Behavioral Management Strategies: Coping strategies Behavioral Health Self-Management Outcome: 3 (uncertain) Major Change/Loss/Stressor/Fears (CP): Medical condition, self Techniques to Cope with Loss/Stress/Change: Diversional activities Quality of Family Relationships: helpful, supportive Do you feel physically threatened by others?: No    05/06/2024    PHQ2-9 Depression Screening   Little interest or pleasure in doing things Not at all  Feeling down, depressed, or hopeless Not at all  PHQ-2 - Total Score 0  Trouble falling or staying asleep, or sleeping too much    Feeling tired or having little energy    Poor appetite or overeating     Feeling bad about yourself - or that you are a failure or have let yourself or your family down    Trouble concentrating on things, such as reading the newspaper or watching television    Moving or speaking so slowly that other people could have noticed.  Or the opposite - being so fidgety or restless that you have been moving around a lot more than usual    Thoughts that you would be better off dead, or hurting yourself in some way    PHQ2-9 Total Score    If you checked off any problems, how difficult have these problems made it for you to do your work, take care of things at home, or get along with other people    Depression Interventions/Treatment      There were no vitals filed for this visit.  Medications Reviewed Today     Reviewed by Bertrum Rosina HERO,  RN ([employer]er, [employer]ell[employer]) on 05/06/24 at 1306  Med List Status: <None>   Medication Order Taking? Sig Documenting Provider Last Dose Status Informant  acetaminophen   (TYLENOL ) 500 MG tablet [id number] Yes Take 1,000 mg by mouth every 4 (four) hours as needed for mild pain or moderate pain. [provider]  Active Self           Med Note CLAUD MICHEAL ONEIDA Pablo Aug 26, 2022  8:58 AM)    dextrose  5 % SOLN 1,000 mL with fluorouracil  5 GM/100ML SOLN [id number]  Inject into the vein.  Patient not taking: Reported on 05/06/2024   [provider]  Active   diazepam  (VALIUM ) 10 MG tablet [id number] Yes Take 10 mg by mouth in the morning and at bedtime. [provider]  Active Self  fluconazole  (DIFLUCAN ) 150 MG tablet [id number]  Take 1 tablet (150 mg total) by mouth daily.  Patient not taking: Reported on 05/06/2024   Sherrilee Belvie CROME, MD  Active   lidocaine  (LIDODERM ) 5 % [id number] Yes Place 1 patch onto the skin daily. Remove & Discard patch within 12 hours or as directed by MD Francesca Elsie CROME, MD  Active   losartan (COZAAR) 25 MG tablet [id number] Yes Take 25 mg by mouth every evening. [provider]  Active Self  oxyCODONE  (ROXICODONE ) 5 MG immediate release tablet [id number] Yes Take 1 tablet (5 mg total) by mouth every 4 (four) hours as needed for severe pain (pain score 7-10). Grooms, [place], [place]  Active             Recommendation:   Home Health requests: Physical therapy  Follow Up Plan:   Telephone follow-up in 2 weeks  Rosina Forte, BSN RN [facility], [facility] RN Care Manager Direct Dial: [phone]  Fax: [phone]

## 2024-05-06 NOTE — Patient Instructions (Signed)
 Visit Information  Thank you for taking time to visit with me today. Please don't hesitate to contact me if I can be of assistance to you before our next scheduled appointment.  Your next care management appointment is by telephone on 05-20-2024 at 9:30 am  Telephone follow-up in 1 month  Please call the care guide team at [phone] if you need to cancel, schedule, or reschedule an appointment.   Please call the Suicide and Crisis Lifeline: 988 call the [place]  National Suicide Prevention Lifeline: 6704219562 or TTY: 430-575-7288 TTY 480-297-0402) to talk to a trained counselor call 1-800-273-TALK (toll free, 24 hour hotline) if you are experiencing a Mental Health or Behavioral Health Crisis or need someone to talk to.  Rosina Forte, BSN RN [facility], [facility] Health RN Care Manager Direct Dial: [phone]  Fax: [phone]

## 2024-05-07 ENCOUNTER — Telehealth: Payer: Self-pay | Admitting: Pharmacy Technician

## 2024-05-07 ENCOUNTER — Other Ambulatory Visit (HOSPITAL_COMMUNITY): Payer: Self-pay

## 2024-05-07 NOTE — Telephone Encounter (Signed)
 Left a message for Amy w/ [facility] to hold PT x 2 weeks per provider notes

## 2024-05-07 NOTE — Telephone Encounter (Signed)
 Pharmacy Patient Advocate Encounter   Received notification from CoverMyMeds that prior authorization for oxyCODONE  HCl 5MG  tablets is required/requested.   Insurance verification completed.   The patient is insured through [place].   Per test claim: Refill too soon. PA is not needed at this time. Medication was filled 05/07/2024. Next eligible fill date is 05/11/2024.

## 2024-05-11 ENCOUNTER — Encounter: Payer: Self-pay | Admitting: *Deleted

## 2024-05-12 NOTE — Telephone Encounter (Signed)
**Note De-identified  [place] Obfuscation** Please advise 

## 2024-05-13 ENCOUNTER — Telehealth: Payer: Self-pay

## 2024-05-13 ENCOUNTER — Other Ambulatory Visit: Payer: Self-pay

## 2024-05-13 ENCOUNTER — Emergency Department (HOSPITAL_COMMUNITY)

## 2024-05-13 ENCOUNTER — Encounter (HOSPITAL_COMMUNITY): Payer: Self-pay | Admitting: Emergency Medicine

## 2024-05-13 ENCOUNTER — Inpatient Hospital Stay (HOSPITAL_COMMUNITY)
Admission: EM | Admit: 2024-05-13 | Discharge: 2024-05-17 | DRG: 542 | Disposition: A | Discharge status: Skilled Nursing Facility | Attending: Family Medicine | Admitting: Family Medicine

## 2024-05-13 DIAGNOSIS — R1310 Dysphagia, unspecified: Secondary | ICD-10-CM | POA: Diagnosis present

## 2024-05-13 DIAGNOSIS — Z886 Allergy status to analgesic agent status: Secondary | ICD-10-CM

## 2024-05-13 DIAGNOSIS — M6284 Sarcopenia: Secondary | ICD-10-CM | POA: Diagnosis present

## 2024-05-13 DIAGNOSIS — N390 Urinary tract infection, site not specified: Secondary | ICD-10-CM | POA: Diagnosis present

## 2024-05-13 DIAGNOSIS — Z79899 Other long term (current) drug therapy: Secondary | ICD-10-CM | POA: Diagnosis not present

## 2024-05-13 DIAGNOSIS — C7911 Secondary malignant neoplasm of bladder: Secondary | ICD-10-CM | POA: Diagnosis not present

## 2024-05-13 DIAGNOSIS — E8809 Other disorders of plasma-protein metabolism, not elsewhere classified: Secondary | ICD-10-CM | POA: Diagnosis present

## 2024-05-13 DIAGNOSIS — Z515 Encounter for palliative care: Secondary | ICD-10-CM | POA: Diagnosis not present

## 2024-05-13 DIAGNOSIS — B370 Candidal stomatitis: Secondary | ICD-10-CM | POA: Diagnosis not present

## 2024-05-13 DIAGNOSIS — M51369 Other intervertebral disc degeneration, lumbar region without mention of lumbar back pain or lower extremity pain: Secondary | ICD-10-CM | POA: Diagnosis present

## 2024-05-13 DIAGNOSIS — Z923 Personal history of irradiation: Secondary | ICD-10-CM

## 2024-05-13 DIAGNOSIS — R627 Adult failure to thrive: Secondary | ICD-10-CM | POA: Diagnosis present

## 2024-05-13 DIAGNOSIS — C799 Secondary malignant neoplasm of unspecified site: Principal | ICD-10-CM

## 2024-05-13 DIAGNOSIS — C7951 Secondary malignant neoplasm of bone: Secondary | ICD-10-CM | POA: Diagnosis present

## 2024-05-13 DIAGNOSIS — M545 Low back pain, unspecified: Secondary | ICD-10-CM | POA: Diagnosis not present

## 2024-05-13 DIAGNOSIS — E871 Hypo-osmolality and hyponatremia: Secondary | ICD-10-CM | POA: Diagnosis present

## 2024-05-13 DIAGNOSIS — C7889 Secondary malignant neoplasm of other digestive organs: Secondary | ICD-10-CM | POA: Diagnosis present

## 2024-05-13 DIAGNOSIS — N179 Acute kidney failure, unspecified: Secondary | ICD-10-CM | POA: Diagnosis not present

## 2024-05-13 DIAGNOSIS — F17210 Nicotine dependence, cigarettes, uncomplicated: Secondary | ICD-10-CM | POA: Diagnosis present

## 2024-05-13 DIAGNOSIS — Z7901 Long term (current) use of anticoagulants: Secondary | ICD-10-CM | POA: Diagnosis not present

## 2024-05-13 DIAGNOSIS — M549 Dorsalgia, unspecified: Secondary | ICD-10-CM | POA: Diagnosis present

## 2024-05-13 DIAGNOSIS — E43 Unspecified severe protein-calorie malnutrition: Secondary | ICD-10-CM | POA: Diagnosis present

## 2024-05-13 DIAGNOSIS — I700 Atherosclerosis of aorta: Secondary | ICD-10-CM | POA: Diagnosis present

## 2024-05-13 DIAGNOSIS — C7989 Secondary malignant neoplasm of other specified sites: Secondary | ICD-10-CM | POA: Diagnosis present

## 2024-05-13 DIAGNOSIS — R64 Cachexia: Secondary | ICD-10-CM | POA: Diagnosis present

## 2024-05-13 DIAGNOSIS — E861 Hypovolemia: Secondary | ICD-10-CM | POA: Diagnosis present

## 2024-05-13 DIAGNOSIS — Z880 Allergy status to penicillin: Secondary | ICD-10-CM | POA: Diagnosis not present

## 2024-05-13 DIAGNOSIS — Z885 Allergy status to narcotic agent status: Secondary | ICD-10-CM

## 2024-05-13 DIAGNOSIS — Z882 Allergy status to sulfonamides status: Secondary | ICD-10-CM

## 2024-05-13 DIAGNOSIS — I10 Essential (primary) hypertension: Secondary | ICD-10-CM | POA: Diagnosis present

## 2024-05-13 DIAGNOSIS — S32050A Wedge compression fracture of fifth lumbar vertebra, initial encounter for closed fracture: Secondary | ICD-10-CM | POA: Diagnosis not present

## 2024-05-13 DIAGNOSIS — C679 Malignant neoplasm of bladder, unspecified: Secondary | ICD-10-CM | POA: Diagnosis present

## 2024-05-13 DIAGNOSIS — J449 Chronic obstructive pulmonary disease, unspecified: Secondary | ICD-10-CM | POA: Diagnosis not present

## 2024-05-13 DIAGNOSIS — B3731 Acute candidiasis of vulva and vagina: Secondary | ICD-10-CM | POA: Diagnosis present

## 2024-05-13 DIAGNOSIS — Z8249 Family history of ischemic heart disease and other diseases of the circulatory system: Secondary | ICD-10-CM | POA: Diagnosis not present

## 2024-05-13 DIAGNOSIS — Z8419 Family history of other disorders of kidney and ureter: Secondary | ICD-10-CM

## 2024-05-13 DIAGNOSIS — E860 Dehydration: Secondary | ICD-10-CM | POA: Diagnosis present

## 2024-05-13 DIAGNOSIS — G893 Neoplasm related pain (acute) (chronic): Secondary | ICD-10-CM | POA: Diagnosis not present

## 2024-05-13 DIAGNOSIS — M8458XA Pathological fracture in neoplastic disease, other specified site, initial encounter for fracture: Secondary | ICD-10-CM | POA: Diagnosis present

## 2024-05-13 DIAGNOSIS — Z8349 Family history of other endocrine, nutritional and metabolic diseases: Secondary | ICD-10-CM

## 2024-05-13 DIAGNOSIS — Z681 Body mass index (BMI) 19 or less, adult: Secondary | ICD-10-CM

## 2024-05-13 DIAGNOSIS — Z9221 Personal history of antineoplastic chemotherapy: Secondary | ICD-10-CM

## 2024-05-13 DIAGNOSIS — Z818 Family history of other mental and behavioral disorders: Secondary | ICD-10-CM

## 2024-05-13 LAB — CBC WITH DIFFERENTIAL/PLATELET
Abs Immature Granulocytes: 0.09 K/uL — ABNORMAL HIGH (ref 0.00–0.07)
Basophils Absolute: 0.1 K/uL (ref 0.0–0.1)
Basophils Relative: 0 %
Eosinophils Absolute: 0.1 K/uL (ref 0.0–0.5)
Eosinophils Relative: 1 %
HCT: 32.7 % — ABNORMAL LOW (ref 36.0–46.0)
Hemoglobin: 10.5 g/dL — ABNORMAL LOW (ref 12.0–15.0)
Immature Granulocytes: 1 %
Lymphocytes Relative: 4 %
Lymphs Abs: 0.7 K/uL (ref 0.7–4.0)
MCH: 28.2 pg (ref 26.0–34.0)
MCHC: 32.1 g/dL (ref 30.0–36.0)
MCV: 87.7 fL (ref 80.0–100.0)
Monocytes Absolute: 0.8 K/uL (ref 0.1–1.0)
Monocytes Relative: 5 %
Neutro Abs: 13.7 K/uL — ABNORMAL HIGH (ref 1.7–7.7)
Neutrophils Relative %: 89 %
Platelets: 366 K/uL (ref 150–400)
RBC: 3.73 MIL/uL — ABNORMAL LOW (ref 3.87–5.11)
RDW: 15.5 % (ref 11.5–15.5)
WBC: 15.3 K/uL — ABNORMAL HIGH (ref 4.0–10.5)
nRBC: 0 % (ref 0.0–0.2)

## 2024-05-13 LAB — URINALYSIS, W/ REFLEX TO CULTURE (INFECTION SUSPECTED)
Bilirubin Urine: NEGATIVE
Glucose, UA: NEGATIVE mg/dL
Hgb urine dipstick: NEGATIVE
Ketones, ur: 5 mg/dL — AB
Leukocytes,Ua: NEGATIVE
Nitrite: NEGATIVE
Protein, ur: 30 mg/dL — AB
Specific Gravity, Urine: 1.015 (ref 1.005–1.030)
pH: 6 (ref 5.0–8.0)

## 2024-05-13 LAB — COMPREHENSIVE METABOLIC PANEL WITH GFR
ALT: 23 U/L (ref 0–44)
AST: 43 U/L — ABNORMAL HIGH (ref 15–41)
Albumin: 3 g/dL — ABNORMAL LOW (ref 3.5–5.0)
Alkaline Phosphatase: 297 U/L — ABNORMAL HIGH (ref 38–126)
Anion gap: 11 (ref 5–15)
BUN: 20 mg/dL (ref 8–23)
CO2: 24 mmol/L (ref 22–32)
Calcium: 8.5 mg/dL — ABNORMAL LOW (ref 8.9–10.3)
Chloride: 99 mmol/L (ref 98–111)
Creatinine, Ser: 1.05 mg/dL — ABNORMAL HIGH (ref 0.44–1.00)
GFR, Estimated: 57 mL/min — ABNORMAL LOW (ref >60)
Glucose, Bld: 93 mg/dL (ref 70–99)
Potassium: 5.1 mmol/L (ref 3.5–5.1)
Sodium: 134 mmol/L — ABNORMAL LOW (ref 135–145)
Total Bilirubin: 1.2 mg/dL (ref 0.0–1.2)
Total Protein: 6.5 g/dL (ref 6.5–8.1)

## 2024-05-13 MED ORDER — FLUCONAZOLE 150 MG PO TABS
150.0000 mg | ORAL_TABLET | Freq: Once | ORAL | Status: AC
  Administered 2024-05-13: 150 mg via ORAL
  Filled 2024-05-13: qty 1

## 2024-05-13 MED ORDER — GADOBUTROL 1 MMOL/ML IV SOLN
4.0000 mL | Freq: Once | INTRAVENOUS | Status: AC | PRN
  Administered 2024-05-13: 4 mL via INTRAVENOUS

## 2024-05-13 MED ORDER — HYDROMORPHONE HCL 1 MG/ML IJ SOLN
0.5000 mg | INTRAMUSCULAR | Status: DC | PRN
  Administered 2024-05-13 – 2024-05-14 (×2): 0.5 mg via INTRAVENOUS
  Filled 2024-05-13 (×2): qty 0.5

## 2024-05-13 MED ORDER — MELATONIN 3 MG PO TABS: 6.0000 mg | ORAL_TABLET | Freq: Every evening | ORAL | Status: DC | PRN

## 2024-05-13 MED ORDER — HYDROMORPHONE HCL 1 MG/ML IJ SOLN
1.0000 mg | Freq: Once | INTRAMUSCULAR | Status: AC
  Administered 2024-05-13: 1 mg via INTRAVENOUS
  Filled 2024-05-13: qty 1

## 2024-05-13 MED ORDER — PROCHLORPERAZINE EDISYLATE 10 MG/2ML IJ SOLN
5.0000 mg | Freq: Four times a day (QID) | INTRAMUSCULAR | Status: DC | PRN
  Administered 2024-05-13 – 2024-05-14 (×2): 5 mg via INTRAVENOUS
  Filled 2024-05-13 (×2): qty 2

## 2024-05-13 MED ORDER — DIAZEPAM 5 MG PO TABS: 5.0000 mg | ORAL_TABLET | Freq: Three times a day (TID) | ORAL | Status: DC | PRN

## 2024-05-13 MED ORDER — LEVOFLOXACIN IN D5W 750 MG/150ML IV SOLN
750.0000 mg | INTRAVENOUS | Status: DC
Start: 2024-05-13 — End: 2024-05-15
  Administered 2024-05-13: 750 mg via INTRAVENOUS
  Filled 2024-05-13: qty 150

## 2024-05-13 MED ORDER — LACTATED RINGERS IV SOLN: INTRAVENOUS | Status: DC

## 2024-05-13 MED ORDER — ACETAMINOPHEN 500 MG PO TABS
500.0000 mg | ORAL_TABLET | Freq: Four times a day (QID) | ORAL | Status: DC | PRN
  Administered 2024-05-15: 500 mg via ORAL
  Filled 2024-05-13: qty 1

## 2024-05-13 MED ORDER — OXYCODONE HCL 5 MG PO TABS
5.0000 mg | ORAL_TABLET | ORAL | Status: DC | PRN
  Administered 2024-05-13 – 2024-05-14 (×2): 10 mg via ORAL
  Filled 2024-05-13 (×2): qty 2

## 2024-05-13 MED ORDER — POLYETHYLENE GLYCOL 3350 17 G PO PACK
17.0000 g | PACK | Freq: Every day | ORAL | Status: DC | PRN
  Administered 2024-05-16: 17 g via ORAL
  Filled 2024-05-13: qty 1

## 2024-05-13 MED ORDER — LACTATED RINGERS IV SOLN
INTRAVENOUS | Status: DC
Start: 2024-05-13 — End: 2024-05-13

## 2024-05-13 MED ORDER — ENOXAPARIN SODIUM 30 MG/0.3ML IJ SOSY
30.0000 mg | PREFILLED_SYRINGE | INTRAMUSCULAR | Status: DC
  Administered 2024-05-13 – 2024-05-16 (×3): 30 mg via SUBCUTANEOUS
  Filled 2024-05-13 (×4): qty 0.3

## 2024-05-13 MED ORDER — ONDANSETRON HCL 4 MG/2ML IJ SOLN
4.0000 mg | Freq: Once | INTRAMUSCULAR | Status: AC
  Administered 2024-05-13: 4 mg via INTRAVENOUS
  Filled 2024-05-13: qty 2

## 2024-05-13 MED ORDER — SODIUM CHLORIDE 0.9 % IV BOLUS
1000.0000 mL | Freq: Once | INTRAVENOUS | Status: AC
  Administered 2024-05-13: 1000 mL via INTRAVENOUS

## 2024-05-13 MED ORDER — FENTANYL CITRATE (PF) 100 MCG/2ML IJ SOLN
25.0000 ug | Freq: Once | INTRAMUSCULAR | Status: AC
  Administered 2024-05-13: 25 ug via INTRAVENOUS
  Filled 2024-05-13: qty 2

## 2024-05-13 MED ORDER — NICOTINE 14 MG/24HR TD PT24
14.0000 mg | MEDICATED_PATCH | Freq: Every day | TRANSDERMAL | Status: DC
  Administered 2024-05-13 – 2024-05-17 (×5): 14 mg via TRANSDERMAL
  Filled 2024-05-13 (×5): qty 1

## 2024-05-13 MED ORDER — IOHEXOL 300 MG/ML  SOLN
100.0000 mL | Freq: Once | INTRAMUSCULAR | Status: AC | PRN
  Administered 2024-05-13: 100 mL via INTRAVENOUS

## 2024-05-13 MED ORDER — MORPHINE SULFATE (PF) 4 MG/ML IV SOLN
4.0000 mg | Freq: Once | INTRAVENOUS | Status: DC
  Filled 2024-05-13: qty 1

## 2024-05-13 NOTE — Plan of Care (Signed)

## 2024-05-13 NOTE — ED Triage Notes (Addendum)
 Pt from home with reports of increase in her back pain. Pt reports she has been eating less and moving around less recently. Denies numbness or tingling in legs. Denies loss of bowel or bladder.

## 2024-05-13 NOTE — ED Provider Notes (Signed)
 [place] EMERGENCY DEPARTMENT AT [facility] Provider Note   CSN: [id number] Arrival date & time: 05/13/24  1431     Patient presents with: Back Pain  HPI Raven Lopez is a 68 y.o. female with history of COPD, bladder cancer, and recent diagnosis of compression fracture of L5 and concern for pathologic fracture in that area as well presenting for back pain.  She states has been ongoing for couple of months but worse in the last couple days.  She was here in any pain a couple weeks ago and was diagnosed with a compression fracture at L5 and there was concern for associated pathologic fracture and lytic lesion.  Was advised at that time to follow-up with oncology given her history of bladder cancer.  She states she has not yet seen oncology.  She is here with her son who reports that her mobility has significantly declined in the last couple days unable to get out of bed due to the pain in her back.  Also appetite has been poor.  She denies any weakness numbness or tingling in her extremities.  Denies urinary or bowel changes.    Back Pain      Prior to Admission medications   Medication Sig Start Date End Date Taking? Authorizing Provider  acetaminophen  (TYLENOL ) 500 MG tablet Take 1,000 mg by mouth every 4 (four) hours as needed for mild pain or moderate pain.    [provider]  dextrose  5 % SOLN 1,000 mL with fluorouracil  5 GM/100ML SOLN Inject into the vein. Patient not taking: Reported on 05/06/2024 10/30/22   [provider]  diazepam  (VALIUM ) 10 MG tablet Take 10 mg by mouth in the morning and at bedtime.    [provider]  fluconazole  (DIFLUCAN ) 150 MG tablet Take 1 tablet (150 mg total) by mouth daily. Patient not taking: Reported on 05/06/2024 03/25/24   Sherrilee Belvie CROME, MD  lidocaine  (LIDODERM ) 5 % Place 1 patch onto the skin daily. Remove & Discard patch within 12 hours or as directed by MD 04/16/24   Francesca Elsie CROME, MD   losartan (COZAAR) 25 MG tablet Take 25 mg by mouth every evening. 12/14/21   [provider]  oxyCODONE  (OXY IR/ROXICODONE ) 5 MG immediate release tablet Take 1 tablet (5 mg total) by mouth every 4 (four) hours as needed for severe pain (pain score 7-10). 05/07/24   Grooms, [place], PA-C    Allergies: Penicillins, Aspirin, Demerol [meperidine hcl], and Sulfonamide derivatives    Review of Systems  Musculoskeletal:  Positive for back pain.     Physical Exam   Vitals:   05/13/24 2348 05/14/24 0349  BP: 127/65 99/67  Pulse: 83 81  Resp: 16 18  Temp: 99.1 F (37.3 C) 97.7 F (36.5 C)  SpO2: 98% 96%    CONSTITUTIONAL:  well-appearing, NAD, pale, cachectic NEURO:  Alert and oriented x 3, CN 3-12 grossly intact EYES:  eyes equal and reactive ENT/NECK:  Supple, no stridor CARDIO:  regular rhythm, appears well-perfused PULM:  No respiratory distress, CTAB GI/GU:  non-distended, soft, non tender MSK/SPINE:  No gross deformities, no edema, moving all extremities, patient refused assessment of back range of motion and evaluation due to her pain.  She is maintaining her legs in a flexed position and telling me that this relieves the pain in her lower back SKIN:  no rash, atraumatic  *Additional and/or pertinent findings included in MDM below   (all labs ordered are listed, but only  abnormal results are displayed) Labs Reviewed  COMPREHENSIVE METABOLIC PANEL WITH GFR - Abnormal; Notable for the following components:      Result Value   Sodium 134 (*)    Creatinine, Ser 1.05 (*)    Calcium 8.5 (*)    Albumin 3.0 (*)    AST 43 (*)    Alkaline Phosphatase 297 (*)    GFR, Estimated 57 (*)    All other components within normal limits  CBC WITH DIFFERENTIAL/PLATELET - Abnormal; Notable for the following components:   WBC 15.3 (*)    RBC 3.73 (*)    Hemoglobin 10.5 (*)    HCT 32.7 (*)    Neutro Abs 13.7 (*)    Abs Immature Granulocytes 0.09 (*)    All other components  within normal limits  URINALYSIS, W/ REFLEX TO CULTURE (INFECTION SUSPECTED) - Abnormal; Notable for the following components:   Ketones, ur 5 (*)    Protein, ur 30 (*)    Bacteria, UA RARE (*)    All other components within normal limits  URINALYSIS, W/ REFLEX TO CULTURE (INFECTION SUSPECTED) - Abnormal; Notable for the following components:   Specific Gravity, Urine >1.046 (*)    Ketones, ur 5 (*)    All other components within normal limits  CBC WITH DIFFERENTIAL/PLATELET - Abnormal; Notable for the following components:   WBC 14.6 (*)    RBC 3.40 (*)    Hemoglobin 9.4 (*)    HCT 30.1 (*)    Neutro Abs 11.7 (*)    Monocytes Absolute 1.3 (*)    Abs Immature Granulocytes 0.10 (*)    All other components within normal limits  COMPREHENSIVE METABOLIC PANEL WITH GFR - Abnormal; Notable for the following components:   Sodium 133 (*)    Calcium 8.0 (*)    Total Protein 5.8 (*)    Albumin 2.7 (*)    Alkaline Phosphatase 297 (*)    All other components within normal limits  MAGNESIUM  PHOSPHORUS  CBC WITH DIFFERENTIAL/PLATELET  HIV ANTIBODY (ROUTINE TESTING W REFLEX)    EKG: None  Radiology: CT CHEST ABDOMEN PELVIS W CONTRAST Result Date: 05/13/2024 CLINICAL DATA:  History of bladder cancer. Metastatic disease evaluation. EXAM: CT CHEST, ABDOMEN, AND PELVIS WITH CONTRAST TECHNIQUE: Multidetector CT imaging of the chest, abdomen and pelvis was performed following the standard protocol during bolus administration of intravenous contrast. RADIATION DOSE REDUCTION: This exam was performed according to the departmental dose-optimization program which includes automated exposure control, adjustment of the mA and/or kV according to patient size and/or use of iterative reconstruction technique. CONTRAST:  OMNIPAQUE  IOHEXOL  300 MG/ML  SOLN COMPARISON:  CT dated 06/23/2023. FINDINGS: CT CHEST FINDINGS Cardiovascular: There is no cardiomegaly or pericardial effusion. There is coronary  vascular calcification and calcification of the mitral annulus. Right-sided Port-A-Cath with tip in the right atrium. Moderate atherosclerotic calcification of the thoracic aorta. No aneurysmal dilatation or dissection. The central pulmonary arteries are patent. Mediastinum/Nodes: No hilar adenopathy. Subcarinal adenopathy measures 2.2 cm in short axis new since the prior CT. The esophagus is grossly unremarkable. No mediastinal fluid collection. Lungs/Pleura: A 4 mm nodule in the left lower lobe (120/7) and new since the prior CT. A cluster of nodular density in the anterior left lung base (114/7) may be inflammatory or infectious in etiology. A 6 mm left upper lobe nodule (45/7) present on the prior CT. No consolidative changes. Trace right pleural effusion. No pneumothorax. The central airways are patent. Musculoskeletal: Osteopenia with degenerative  changes of the spine. Old T8 and T9 compression fractures. No acute osseous pathology. CT ABDOMEN PELVIS FINDINGS No intra-abdominal free air.  Small ascites. Hepatobiliary: Large hypoenhancing masses throughout the liver consistent with metastatic disease and new since the prior CT. The gallbladder is distended. There is dilatation of the common bile duct measuring up to 17 mm in caliber. No stone noted in the gallbladder or in the bowel duct. Pancreas: A 1.4 x 1.1 cm hypoenhancing mass in the proximal pancreas may represent metastatic disease versus primary pancreatic adenocarcinoma. There is mild dilatation of the main pancreatic duct. Spleen: Normal in size without focal abnormality. Adrenals/Urinary Tract: The adrenal glands unremarkable. Right renal inferior pole cyst. There is no hydronephrosis on either side there is symmetric enhancement and excretion of contrast by both kidneys. The urinary bladder is minimally distended. Trabeculated versus thickened right bladder dome. Stomach/Bowel: Moderate stool throughout the colon. No bowel obstruction. No evidence  of acute appendicitis. Vascular/Lymphatic: Advanced aortoiliac atherosclerotic disease. The IVC is unremarkable. No portal venous gas. Retroperitoneal adenopathy measure up to 2 cm in short axis posterior to the IVC and 16 mm short axis posterior to the main portal vein. Right iliac chain or pelvic sidewall adenopathy/implant measures 2.4 x 2.0 cm (93/4). Additional left iliac chain and retroperitoneal adenopathy. Reproductive: The uterus is grossly unremarkable. Other: Diffuse subcutaneous edema. Musculoskeletal: Osteopenia with degenerative changes of the spine. Mixed density, predominantly sclerotic lesion involving the L5 with pathologic fracture and approximately 50% loss of vertebral body height. There is buckling of the posterior cortex with approximately 5 mm retropulsion and focal narrowing of the central canal. Extension of the infiltrative osseous mass/metastasis into the left L5 pedicle and probable extension into the left L5-S1 neural foramina. IMPRESSION: 1. Interval development of metastatic disease in the chest, abdomen, and pelvis as above. 2. A 1.4 x 1.1 cm hypoenhancing mass in the proximal pancreas may represent metastatic disease versus primary pancreatic adenocarcinoma. 3. Metastatic osseous disease involving L5 with associated pathologic fracture and posterior buckling and focal narrowing of the central canal. Aortic Atherosclerosis (ICD10-I70.0). Electronically Signed   By: Vanetta Chou M.D.   On: 05/13/2024 19:02   MR Lumbar Spine W Wo Contrast Result Date: 05/13/2024 EXAM: MRI LUMBAR SPINE 05/13/2024 05:59:24 PM TECHNIQUE: Multiplanar multisequence MRI of the lumbar spine was performed with and without the administration of intravenous contrast. 4 mL of gadobutrol (GADAVIST) 1 MMOL/ML injection was administered. COMPARISON: CT of the lumbar spine 04/16/2024. CLINICAL HISTORY: Low back pain. Clinical history is abnormal CT of the lumbar spine and low back pain. FINDINGS: BONES AND  ALIGNMENT: Normal alignment. Normal vertebral body heights *except for the compression fracture at L5 and superior endplate fracture at L3*. Superior endplate Schmorl nodes are present at T12, L1, and L2. SPINAL CORD: The conus terminates normally. SOFT TISSUES: No paraspinal mass. Enlarged paraaortic lymph nodes measure up to 20 mm on the right and 21 mm on the left. Dilated small bowel present in the right upper quadrant. Atherosclerotic changes are present in the abdominal aorta; largest transverse diameter is 2.8 cm. T12-L1: Superior endplate Schmorl node at T12. No significant disc herniation. No spinal canal stenosis or neural foraminal narrowing. L1-L2: Superior endplate Schmorl node at L1. No significant disc herniation. No spinal canal stenosis or neural foraminal narrowing. L2-L3: Superior endplate Schmorl node at L2. No significant disc herniation. No spinal canal stenosis or neural foraminal narrowing. L3-L4: Superior endplate fracture at L3 is similar to the prior exam. An enhancing mass  lesion in the left anterior L3 vertebral body is more obvious than on the prior study, measuring 18 x 13 mm on sagittal images. An enhancing lesion in the right posterolateral L4 vertebral body extends into the pedicle without fracture. A 12 mm lesion is present in the anterior inferior L4 vertebral body. No significant disc herniation. No spinal canal stenosis or neural foraminal narrowing. L4-L5: Diffuse marrow signal abnormality and enhancement is present within the L5 vertebral body. A compression fracture has progressed with minimal height now measuring 12 mm compared to 14 mm previously. Compressed bone extends posteriorly into the spinal canal. Abnormal signal and enhancement extends into the pedicles, left greater than right. No significant disc herniation. No spinal canal stenosis or neural foraminal narrowing. L5-S1: No significant disc herniation. No spinal canal stenosis or neural foraminal narrowing.  IMPRESSION: 1. Multiple osseous lesions consistent with metastatic disease. 2. Progression of L5 compression fracture with minimal additional height loss, with abnormal signal and enhancement extending into the pedicles and bone retropulsed posteriorly into the spinal canal. 3. Enhancing lesions at L4 involving the right posterolateral vertebral body and pedicle and a separate 12 mm lesion in the anterior inferior L4 vertebral body. 4. New L3 superior endplate fracture with an enhancing mass in the left anterior L3 vertebral body measuring 18 x 13 mm. 5. Enlarged paraaortic lymph nodes measuring up to 21 mm, also consistent with metastatic disease these have increased in size since the prior study. Electronically signed by: Lonni Necessary MD 05/13/2024 06:25 PM EST RP Workstation: [id number]     Procedures   Medications Ordered in the ED  enoxaparin (LOVENOX) injection 30 mg (30 mg Subcutaneous Given 05/13/24 2153)  acetaminophen  (TYLENOL ) tablet 500 mg (has no administration in time range)  prochlorperazine  (COMPAZINE ) injection 5 mg (5 mg Intravenous Given 05/13/24 2149)  melatonin tablet 6 mg (has no administration in time range)  polyethylene glycol (MIRALAX / GLYCOLAX) packet 17 g (has no administration in time range)  oxyCODONE  (Oxy IR/ROXICODONE ) immediate release tablet 5-10 mg (10 mg Oral Given 05/14/24 0807)  HYDROmorphone  (DILAUDID ) injection 0.5 mg (0.5 mg Intravenous Given 05/13/24 2251)  diazepam  (VALIUM ) tablet 5 mg (has no administration in time range)  levofloxacin (LEVAQUIN) IVPB 750 mg (750 mg Intravenous New Bag/Given 05/13/24 2204)  nicotine (NICODERM CQ - dosed in mg/24 hours) patch 14 mg (14 mg Transdermal Patch Applied 05/14/24 0809)  lactated ringers  infusion ( Intravenous New Bag/Given 05/13/24 2213)  ondansetron  (ZOFRAN ) injection 4 mg (4 mg Intravenous Given 05/13/24 1625)  fentaNYL  (SUBLIMAZE ) injection 25 mcg (25 mcg Intravenous Given 05/13/24 1628)  sodium chloride  0.9  % bolus 1,000 mL (1,000 mLs Intravenous New Bag/Given 05/13/24 1642)  HYDROmorphone  (DILAUDID ) injection 1 mg (1 mg Intravenous Given 05/13/24 1707)  gadobutrol (GADAVIST) 1 MMOL/ML injection 4 mL (4 mLs Intravenous Contrast Given 05/13/24 1800)  iohexol  (OMNIPAQUE ) 300 MG/ML solution 100 mL (100 mLs Intravenous Contrast Given 05/13/24 1817)  fluconazole  (DIFLUCAN ) tablet 150 mg (150 mg Oral Given 05/13/24 2154)                                    Medical Decision Making Amount and/or Complexity of Data Reviewed Labs: ordered. Radiology: ordered.  Risk Prescription drug management. Decision regarding hospitalization.   Initial Impression and Ddx 69 year old well-appearing female presenting for back pain.  Exam notable for general cachectic and frail appearance and limited mobility of the back due to pain.  Unable to ambulate.  DDx includes cauda equina syndrome, metastasis, cord injury, intra-abdominal metastasis, spinal epidural abscess, spinal infarct, other. Patient PMH that increases complexity of ED encounter: Bladder cancer and COPD  Interpretation of Diagnostics - I independent reviewed and interpreted the labs as followed: Leukocytosis 15.3, anemia 10.5, GFR reduced to 57, alk phos 297  - I independently visualized the following imaging with scope of interpretation limited to determining acute life threatening conditions related to emergency care: MRI lumbar, which revealed . Multiple osseous lesions consistent with metastatic disease.  See details above.  CT of the abdomen and pelvis showed mass about the proximal pancreas concerning for mets versus primary adenocarcinoma and progression of L5 compression fracture with minimal additional height loss, with abnormal signal and enhancement extending into the pedicles and bone retropulsed posteriorly into the spinal canal  Patient Reassessment and Ultimate Disposition/Management Workup concerning for metastatic disease which I do feel is  contributing to her pain and reduced mobility.  Shared imaging findings with patient and her family.  Admitted to hospital service for pain control and PT OT evaluation and further evaluation for metastatic disease with Dr. Terry Hurst.  Patient management required discussion with the following services or consulting groups:  Hospitalist Service  Complexity of Problems Addressed Acute complicated illness or Injury  Additional Data Reviewed and Analyzed Further history obtained from: Past medical history and medications listed in the EMR and Prior ED visit notes  Patient Encounter Risk Assessment Consideration of hospitalization      Final diagnoses:  Metastatic malignant neoplasm, unspecified site [facility])    ED Discharge Orders     None          Sundeep Destin K, PA-C 05/14/24 0846    Franklyn Sid SAILOR, MD 05/15/24 [phone]

## 2024-05-13 NOTE — H&P (Addendum)
 History and Physical  [name]:[id number] DOB: 01-02-1956 DOA: 05/13/2024  Referring physician: Lang Rush, PA-EDP  PCP: Mancil Charmaine RIGGERS  Outpatient Specialists: Oncology. Patient coming from: Home.  Chief Complaint: Back pain.  HPI: Raven Lopez is a 68 y.o. female with medical history significant for bladder cancer(07/29/22) status post chemotherapy and radiation therapy, generalized anxiety disorder, current tobacco user (1ppd), who presents to the ER from home due to severe back pain.  Raven Lopez back pain has been ongoing for the past 2 months and worsening in the last 2 days.  Denies any weakness, numbness, or tingling in Raven Lopez extremities.  Denies loss of sensation with bowel or bladder.  Was diagnosed with compression fracture at L5 on 04/16/2024.  The pain has been progressively worsening.  It is so severe that she has not been able to get out of bed without assistance.  Has also been eating less.  Has not seen Raven Lopez oncologist recently.  In the ER, vital signs are stable.  The patient is requiring several rounds of opiate based IV analgesics.  She had a repeated CT chest abdomen pelvis with contrast which revealed interval development of metastatic disease in the chest, abdomen, and pelvis.  A 1.4 x 1.1 cm hypoechoic enhancing mass in the proximal pancreatic may represent metastatic disease versus primary pancreatic adenocarcinoma.  Metastatic osseous disease involving L5 with associated pathologic fracture and posterior buckling and focal narrowing of the central canal.  Aortic atherosclerosis.  Due to uncontrolled pain in the ER, EDP requested admission for further pain control.  Admitted by [facility], hospitalist service.  Raven Lopez urine analysis returned positive for pyuria.  Endorses increased urinary frequency.  Also stating she has a yeast infection.  Allergy to Rocephin.  Started Levaquin 750 mg daily x 5 days for complicated UTI, and fluconazole  150 mg x 1 for vaginal yeast  infection.  ED Course: Temperature 98.5.  BP 141/66.  Pulse 81, respiration rate 18, O2 saturation 98% on room air.  Review of Systems: Review of systems as noted in the HPI. All other systems reviewed and are negative.   Past Medical History:  Diagnosis Date   AC (acromioclavicular) joint bone spurs    Anxiety    Asthma    COPD (chronic obstructive pulmonary disease) (HCC)    DDD (degenerative disc disease), lumbar    Hypertension    Neck fracture (HCC)    Panic disorder    Scoliosis    Past Surgical History:  Procedure Laterality Date   BLADDER INSTILLATION N/A 08/29/2022   Procedure: BLADDER INSTILLATION;  Surgeon: Sherrilee Belvie CROME, MD;  Location: [facility];  Service: Urology;  Laterality: N/A;   CESAREAN SECTION     CYSTOSCOPY W/ RETROGRADES Bilateral 08/29/2022   Procedure: CYSTOSCOPY WITH RETROGRADE PYELOGRAM;  Surgeon: Sherrilee Belvie CROME, MD;  Location: [facility];  Service: Urology;  Laterality: Bilateral;   IR IMAGING GUIDED PORT INSERTION  10/17/2022   TRANSURETHRAL RESECTION OF BLADDER TUMOR N/A 08/29/2022   Procedure: TRANSURETHRAL RESECTION OF BLADDER TUMOR (TURBT);  Surgeon: Sherrilee Belvie CROME, MD;  Location: [facility];  Service: Urology;  Laterality: N/A;   wisdom teeth removal      Social History:  reports that she has been smoking cigarettes. She has a 40 pack-year smoking history. She has never used smokeless tobacco. She reports that she does not currently use alcohol. She reports that she does not currently use drugs.   Allergies  Allergen Reactions   Penicillins Shortness Of Breath and Swelling  Immediate rash, facial/tongue/throat swelling, SOB or lightheadedness with hypotension    Aspirin Other (See Comments)    Dizzy, weak, and upset stomach   Demerol [Meperidine Hcl] Other (See Comments)    Altered mental status - anger, hallucinations   Sulfonamide Derivatives Nausea And Vomiting    Family History  Problem Relation Age of Onset   Aneurysm Father     Kidney failure Mother    Hypertension Sister    Thyroid  disease Sister    Anxiety disorder Sister    Colitis Sister    Irritable bowel syndrome Sister       Prior to Admission medications   Medication Sig Start Date End Date Taking? Authorizing Provider  acetaminophen  (TYLENOL ) 500 MG tablet Take 1,000 mg by mouth every 4 (four) hours as needed for mild pain or moderate pain.    [provider]  dextrose  5 % SOLN 1,000 mL with fluorouracil  5 GM/100ML SOLN Inject into the vein. Patient not taking: Reported on 05/06/2024 10/30/22   [provider]  diazepam  (VALIUM ) 10 MG tablet Take 10 mg by mouth in the morning and at bedtime.    [provider]  fluconazole  (DIFLUCAN ) 150 MG tablet Take 1 tablet (150 mg total) by mouth daily. Patient not taking: Reported on 05/06/2024 03/25/24   Sherrilee Belvie CROME, MD  lidocaine  (LIDODERM ) 5 % Place 1 patch onto the skin daily. Remove & Discard patch within 12 hours or as directed by MD 04/16/24   Francesca Elsie CROME, MD  losartan (COZAAR) 25 MG tablet Take 25 mg by mouth every evening. 12/14/21   [provider]  oxyCODONE  (OXY IR/ROXICODONE ) 5 MG immediate release tablet Take 1 tablet (5 mg total) by mouth every 4 (four) hours as needed for severe pain (pain score 7-10). 05/07/24   [place], [place], [place]    Physical Exam: BP (!) 141/66 (BP Location: Right Arm)   Pulse 81   Temp 98.5 F (36.9 C) (Oral)   Resp 18   Ht 5' 2 (1.575 m)   Wt 38.1 kg   SpO2 98%   BMI 15.36 kg/m   General: 68 y.o. year-old female cachectic, in no acute distress.  Alert and oriented x3. Cardiovascular: Regular rate and rhythm with no rubs or gallops.  No thyromegaly or JVD noted.  No lower extremity edema. 2/4 pulses in all 4 extremities. Respiratory: Clear to auscultation with no wheezes or rales. Good inspiratory effort. Abdomen: Soft nontender nondistended with normal bowel sounds x4 quadrants. Muskuloskeletal: No cyanosis,  clubbing or edema noted bilaterally Neuro: CN II-XII intact, strength, sensation, reflexes Skin: No ulcerative lesions noted or rashes Psychiatry: Judgement and insight appear normal. Mood is appropriate for condition and setting          Labs on Admission:  Basic Metabolic Panel: Recent Labs  Lab 05/13/24 1604  NA 134*  K 5.1  CL 99  CO2 24  GLUCOSE 93  BUN 20  CREATININE 1.05*  CALCIUM 8.5*   Liver Function Tests: Recent Labs  Lab 05/13/24 1604  AST 43*  ALT 23  ALKPHOS 297*  BILITOT 1.2  PROT 6.5  ALBUMIN 3.0*   No results for input(s): LIPASE, AMYLASE in the last 168 hours. No results for input(s): AMMONIA in the last 168 hours. CBC: Recent Labs  Lab 05/13/24 1628  WBC 15.3*  NEUTROABS 13.7*  HGB 10.5*  HCT 32.7*  MCV 87.7  PLT 366   Cardiac Enzymes: No results for input(s): CKTOTAL, CKMB, CKMBINDEX, TROPONINI in  the last 168 hours.  BNP (last 3 results) No results for input(s): BNP in the last 8760 hours.  ProBNP (last 3 results) No results for input(s): PROBNP in the last 8760 hours.  CBG: No results for input(s): GLUCAP in the last 168 hours.  Radiological Exams on Admission: CT CHEST ABDOMEN PELVIS W CONTRAST Result Date: 05/13/2024 CLINICAL DATA:  History of bladder cancer. Metastatic disease evaluation. EXAM: CT CHEST, ABDOMEN, AND PELVIS WITH CONTRAST TECHNIQUE: Multidetector CT imaging of the chest, abdomen and pelvis was performed following the standard protocol during bolus administration of intravenous contrast. RADIATION DOSE REDUCTION: This exam was performed according to the departmental dose-optimization program which includes automated exposure control, adjustment of the mA and/or kV according to patient size and/or use of iterative reconstruction technique. CONTRAST:  OMNIPAQUE  IOHEXOL  300 MG/ML  SOLN COMPARISON:  CT dated 06/23/2023. FINDINGS: CT CHEST FINDINGS Cardiovascular: There is no cardiomegaly or  pericardial effusion. There is coronary vascular calcification and calcification of the mitral annulus. Right-sided Port-A-Cath with tip in the right atrium. Moderate atherosclerotic calcification of the thoracic aorta. No aneurysmal dilatation or dissection. The central pulmonary arteries are patent. Mediastinum/Nodes: No hilar adenopathy. Subcarinal adenopathy measures 2.2 cm in short axis new since the prior CT. The esophagus is grossly unremarkable. No mediastinal fluid collection. Lungs/Pleura: A 4 mm nodule in the left lower lobe (120/7) and new since the prior CT. A cluster of nodular density in the anterior left lung base (114/7) may be inflammatory or infectious in etiology. A 6 mm left upper lobe nodule (45/7) present on the prior CT. No consolidative changes. Trace right pleural effusion. No pneumothorax. The central airways are patent. Musculoskeletal: Osteopenia with degenerative changes of the spine. Old T8 and T9 compression fractures. No acute osseous pathology. CT ABDOMEN PELVIS FINDINGS No intra-abdominal free air.  Small ascites. Hepatobiliary: Large hypoenhancing masses throughout the liver consistent with metastatic disease and new since the prior CT. The gallbladder is distended. There is dilatation of the common bile duct measuring up to 17 mm in caliber. No stone noted in the gallbladder or in the bowel duct. Pancreas: A 1.4 x 1.1 cm hypoenhancing mass in the proximal pancreas may represent metastatic disease versus primary pancreatic adenocarcinoma. There is mild dilatation of the main pancreatic duct. Spleen: Normal in size without focal abnormality. Adrenals/Urinary Tract: The adrenal glands unremarkable. Right renal inferior pole cyst. There is no hydronephrosis on either side there is symmetric enhancement and excretion of contrast by both kidneys. The urinary bladder is minimally distended. Trabeculated versus thickened right bladder dome. Stomach/Bowel: Moderate stool throughout the  colon. No bowel obstruction. No evidence of acute appendicitis. Vascular/Lymphatic: Advanced aortoiliac atherosclerotic disease. The IVC is unremarkable. No portal venous gas. Retroperitoneal adenopathy measure up to 2 cm in short axis posterior to the IVC and 16 mm short axis posterior to the main portal vein. Right iliac chain or pelvic sidewall adenopathy/implant measures 2.4 x 2.0 cm (93/4). Additional left iliac chain and retroperitoneal adenopathy. Reproductive: The uterus is grossly unremarkable. Other: Diffuse subcutaneous edema. Musculoskeletal: Osteopenia with degenerative changes of the spine. Mixed density, predominantly sclerotic lesion involving the L5 with pathologic fracture and approximately 50% loss of vertebral body height. There is buckling of the posterior cortex with approximately 5 mm retropulsion and focal narrowing of the central canal. Extension of the infiltrative osseous mass/metastasis into the left L5 pedicle and probable extension into the left L5-S1 neural foramina. IMPRESSION: 1. Interval development of metastatic disease in the chest, abdomen,  and pelvis as above. 2. A 1.4 x 1.1 cm hypoenhancing mass in the proximal pancreas may represent metastatic disease versus primary pancreatic adenocarcinoma. 3. Metastatic osseous disease involving L5 with associated pathologic fracture and posterior buckling and focal narrowing of the central canal. Aortic Atherosclerosis (ICD10-I70.0). Electronically Signed   By: Vanetta Chou M.D.   On: 05/13/2024 19:02   MR Lumbar Spine W Wo Contrast Result Date: 05/13/2024 EXAM: MRI LUMBAR SPINE 05/13/2024 05:59:24 PM TECHNIQUE: Multiplanar multisequence MRI of the lumbar spine was performed with and without the administration of intravenous contrast. 4 mL of gadobutrol (GADAVIST) 1 MMOL/ML injection was administered. COMPARISON: CT of the lumbar spine 04/16/2024. CLINICAL HISTORY: Low back pain. Clinical history is abnormal CT of the lumbar spine  and low back pain. FINDINGS: BONES AND ALIGNMENT: Normal alignment. Normal vertebral body heights *except for the compression fracture at L5 and superior endplate fracture at L3*. Superior endplate Schmorl nodes are present at T12, L1, and L2. SPINAL CORD: The conus terminates normally. SOFT TISSUES: No paraspinal mass. Enlarged paraaortic lymph nodes measure up to 20 mm on the right and 21 mm on the left. Dilated small bowel present in the right upper quadrant. Atherosclerotic changes are present in the abdominal aorta; largest transverse diameter is 2.8 cm. T12-L1: Superior endplate Schmorl node at T12. No significant disc herniation. No spinal canal stenosis or neural foraminal narrowing. L1-L2: Superior endplate Schmorl node at L1. No significant disc herniation. No spinal canal stenosis or neural foraminal narrowing. L2-L3: Superior endplate Schmorl node at L2. No significant disc herniation. No spinal canal stenosis or neural foraminal narrowing. L3-L4: Superior endplate fracture at L3 is similar to the prior exam. An enhancing mass lesion in the left anterior L3 vertebral body is more obvious than on the prior study, measuring 18 x 13 mm on sagittal images. An enhancing lesion in the right posterolateral L4 vertebral body extends into the pedicle without fracture. A 12 mm lesion is present in the anterior inferior L4 vertebral body. No significant disc herniation. No spinal canal stenosis or neural foraminal narrowing. L4-L5: Diffuse marrow signal abnormality and enhancement is present within the L5 vertebral body. A compression fracture has progressed with minimal height now measuring 12 mm compared to 14 mm previously. Compressed bone extends posteriorly into the spinal canal. Abnormal signal and enhancement extends into the pedicles, left greater than right. No significant disc herniation. No spinal canal stenosis or neural foraminal narrowing. L5-S1: No significant disc herniation. No spinal canal  stenosis or neural foraminal narrowing. IMPRESSION: 1. Multiple osseous lesions consistent with metastatic disease. 2. Progression of L5 compression fracture with minimal additional height loss, with abnormal signal and enhancement extending into the pedicles and bone retropulsed posteriorly into the spinal canal. 3. Enhancing lesions at L4 involving the right posterolateral vertebral body and pedicle and a separate 12 mm lesion in the anterior inferior L4 vertebral body. 4. New L3 superior endplate fracture with an enhancing mass in the left anterior L3 vertebral body measuring 18 x 13 mm. 5. Enlarged paraaortic lymph nodes measuring up to 21 mm, also consistent with metastatic disease these have increased in size since the prior study. Electronically signed by: Lonni Necessary MD 05/13/2024 06:25 PM EST RP Workstation: [id number]    EKG: I independently viewed the EKG done and my findings are as followed: None available at the time of this visit.  Assessment/Plan Present on Admission:  Back pain  Principal Problem:   Back pain  Back pain secondary to metastatic  disease. Patient with history of bladder cancer diagnosed in January 2024 with metastasis to chest, abdomen, pelvis, pancreas and bone, seen on CT scan. Oncologist is Dr. Davonna. Continue pain control Palliative care medicine and medical oncology consulted to assist with goals of care discussions, and plan moving forward, respectively.  Bladder cancer with mets Follows with Dr. Davonna outpatient Will need close follow-up with medical oncology at discharge.  Complicated UTI in the setting of bladder cancer UA positive for pyuria, leukocytosis 15.3 K, neutrophilia 13.7, bands. Allergy to Rocephin IV Levaquin 750 mg daily x 5 days  Vaginal candidiasis P.o. fluconazole  150 mg x 1 May need longer course of fluconazole  if not improved after 1 dose.  Severe protein calorie malnutrition BMI 15, albumin 3.0. Severe muscle mass  loss Liberalize diet Encourage increase oral protein calorie intake  Elevated liver chemistries in the setting of metastatic disease Alkaline phosphatase and AST elevated Monitor for now  Mild hypovolemic hyponatremia Serum sodium 134 Start gentle IV fluid hydration. Repeat chemistry panel in the morning.  Mild AKI secondary to dehydration from poor oral intake Baseline creatinine 0.7 with GFR greater than 60 Presented with creatinine of 1.05 Gentle IV hydration, LR at 50 cc/h x 2 days. Monitor urine output Repeat BMP in the morning.  Generalized weakness PT OT evaluation with caution The patient has a pathological fracture to L5 TOC consulted to assist with DME's if needed for discharge planning  Tobacco use disorder Current smoker 1 pack/day Nicotine patch 14 mg daily  Goals of care Palliative care medicine consulted to assist with establishing goals of care. Currently the patient is full code. Medical oncology consulted to assist with Raven Lopez plan and prognosis.   Time: 75 minutes.   DVT prophylaxis: Subcu Lovenox daily.  Code Status: Full code, per the patient and family at bedside.  Family Communication: Raven Lopez son (only child) and Raven Lopez sister at bedside.  Disposition Plan: Admitted to MedSurg unit.  Consults called: Palliative care medicine and medical oncology.  Admission status: Inpatient status.   Status is: Inpatient The patient requires at least 2 midnights for further evaluation and treatment of present condition.   Terry LOISE Hurst MD [facility] Pager [phone]  If 7PM-7AM, please contact night-coverage [email or web address] Password [facility]  05/13/2024, 7:27 PM

## 2024-05-13 NOTE — Telephone Encounter (Signed)
 Copied from CRM #[id number]. Topic: General - Other >> May 13, 2024  4:08 PM Hadassah PARAS wrote: Reason for CRM: Verbal order to discharge pt from physical therapist [facility]. Pt is not answering calls or door when app. Please advise [phone]

## 2024-05-14 DIAGNOSIS — M545 Low back pain, unspecified: Secondary | ICD-10-CM | POA: Diagnosis not present

## 2024-05-14 LAB — URINALYSIS, W/ REFLEX TO CULTURE (INFECTION SUSPECTED)
Bacteria, UA: NONE SEEN
Bilirubin Urine: NEGATIVE
Glucose, UA: NEGATIVE mg/dL
Hgb urine dipstick: NEGATIVE
Ketones, ur: 5 mg/dL — AB
Leukocytes,Ua: NEGATIVE
Nitrite: NEGATIVE
Protein, ur: NEGATIVE mg/dL
Specific Gravity, Urine: >1.046 — ABNORMAL HIGH (ref 1.005–1.030)
pH: 6 (ref 5.0–8.0)

## 2024-05-14 LAB — COMPREHENSIVE METABOLIC PANEL WITH GFR
ALT: 21 U/L (ref 0–44)
AST: 40 U/L (ref 15–41)
Albumin: 2.7 g/dL — ABNORMAL LOW (ref 3.5–5.0)
Alkaline Phosphatase: 297 U/L — ABNORMAL HIGH (ref 38–126)
Anion gap: 10 (ref 5–15)
BUN: 17 mg/dL (ref 8–23)
CO2: 22 mmol/L (ref 22–32)
Calcium: 8 mg/dL — ABNORMAL LOW (ref 8.9–10.3)
Chloride: 101 mmol/L (ref 98–111)
Creatinine, Ser: 0.9 mg/dL (ref 0.44–1.00)
GFR, Estimated: >60 mL/min (ref >60)
Glucose, Bld: 98 mg/dL (ref 70–99)
Potassium: 4.4 mmol/L (ref 3.5–5.1)
Sodium: 133 mmol/L — ABNORMAL LOW (ref 135–145)
Total Bilirubin: 0.8 mg/dL (ref 0.0–1.2)
Total Protein: 5.8 g/dL — ABNORMAL LOW (ref 6.5–8.1)

## 2024-05-14 LAB — CBC WITH DIFFERENTIAL/PLATELET
Abs Immature Granulocytes: 0.1 K/uL — ABNORMAL HIGH (ref 0.00–0.07)
Basophils Absolute: 0.1 K/uL (ref 0.0–0.1)
Basophils Relative: 1 %
Eosinophils Absolute: 0.2 K/uL (ref 0.0–0.5)
Eosinophils Relative: 2 %
HCT: 30.1 % — ABNORMAL LOW (ref 36.0–46.0)
Hemoglobin: 9.4 g/dL — ABNORMAL LOW (ref 12.0–15.0)
Immature Granulocytes: 1 %
Lymphocytes Relative: 9 %
Lymphs Abs: 1.3 K/uL (ref 0.7–4.0)
MCH: 27.6 pg (ref 26.0–34.0)
MCHC: 31.2 g/dL (ref 30.0–36.0)
MCV: 88.5 fL (ref 80.0–100.0)
Monocytes Absolute: 1.3 K/uL — ABNORMAL HIGH (ref 0.1–1.0)
Monocytes Relative: 9 %
Neutro Abs: 11.7 K/uL — ABNORMAL HIGH (ref 1.7–7.7)
Neutrophils Relative %: 78 %
Platelets: 346 K/uL (ref 150–400)
RBC: 3.4 MIL/uL — ABNORMAL LOW (ref 3.87–5.11)
RDW: 15.5 % (ref 11.5–15.5)
WBC: 14.6 K/uL — ABNORMAL HIGH (ref 4.0–10.5)
nRBC: 0 % (ref 0.0–0.2)

## 2024-05-14 LAB — HIV ANTIBODY (ROUTINE TESTING W REFLEX): HIV Screen 4th Generation wRfx: NONREACTIVE

## 2024-05-14 LAB — MAGNESIUM: Magnesium: 2 mg/dL (ref 1.7–2.4)

## 2024-05-14 LAB — PHOSPHORUS: Phosphorus: 3.1 mg/dL (ref 2.5–4.6)

## 2024-05-14 MED ORDER — CHLORHEXIDINE GLUCONATE CLOTH 2 % EX PADS
6.0000 | MEDICATED_PAD | Freq: Every day | CUTANEOUS | Status: DC
  Administered 2024-05-14 – 2024-05-17 (×4): 6 via TOPICAL

## 2024-05-14 MED ORDER — LACTATED RINGERS IV SOLN: INTRAVENOUS | Status: DC

## 2024-05-14 MED ORDER — POLYVINYL ALCOHOL 1.4 % OP SOLN
1.0000 [drp] | OPHTHALMIC | Status: DC | PRN
  Administered 2024-05-14: 1 [drp] via OPHTHALMIC
  Filled 2024-05-14: qty 15

## 2024-05-14 MED ORDER — ADULT MULTIVITAMIN W/MINERALS CH: 1.0000 | ORAL_TABLET | Freq: Every day | ORAL | Status: DC

## 2024-05-14 MED ORDER — ADULT MULTIVITAMIN LIQUID CH: 5.0000 mL | Freq: Every day | ORAL | Status: DC

## 2024-05-14 MED ORDER — ADULT MULTIVITAMIN W/MINERALS CH
1.0000 | ORAL_TABLET | Freq: Every day | ORAL | Status: DC
  Filled 2024-05-14: qty 1

## 2024-05-14 MED ORDER — SODIUM CHLORIDE 0.9% FLUSH
10.0000 mL | Freq: Two times a day (BID) | INTRAVENOUS | Status: DC
  Administered 2024-05-14: 20 mL
  Administered 2024-05-15 – 2024-05-17 (×4): 10 mL

## 2024-05-14 MED ORDER — ENSURE PLUS HIGH PROTEIN PO LIQD: 237.0000 mL | Freq: Three times a day (TID) | ORAL | Status: DC

## 2024-05-14 MED ORDER — DIAZEPAM 1 MG/ML PO SOLN: 5.0000 mg | Freq: Three times a day (TID) | ORAL | Status: DC | PRN

## 2024-05-14 MED ORDER — HYDROMORPHONE HCL 1 MG/ML IJ SOLN
0.5000 mg | INTRAMUSCULAR | Status: DC | PRN
  Administered 2024-05-14 – 2024-05-15 (×6): 0.5 mg via INTRAVENOUS
  Filled 2024-05-14 (×7): qty 0.5

## 2024-05-14 MED ORDER — FENTANYL 12 MCG/HR TD PT72
1.0000 | MEDICATED_PATCH | TRANSDERMAL | Status: DC
  Administered 2024-05-14 – 2024-05-17 (×3): 1 via TRANSDERMAL
  Filled 2024-05-14 (×3): qty 1

## 2024-05-14 MED ORDER — SODIUM CHLORIDE 0.9% FLUSH: 10.0000 mL | INTRAVENOUS | Status: DC | PRN

## 2024-05-14 MED ORDER — OXYCODONE HCL 5 MG/5ML PO SOLN
5.0000 mg | ORAL | Status: DC | PRN
  Administered 2024-05-15 – 2024-05-17 (×7): 10 mg via ORAL
  Administered 2024-05-17: 5 mg via ORAL
  Filled 2024-05-14 (×4): qty 10
  Filled 2024-05-14: qty 5
  Filled 2024-05-14 (×2): qty 10
  Filled 2024-05-14 (×2): qty 5

## 2024-05-14 NOTE — TOC Initial Note (Signed)
 Transition of Care [facility]) - Initial/Assessment Note    Patient Details  Name: Raven Lopez MRN: [id number] Date of Birth: 1955-12-29  Transition of Care [facility]) CM/SW Contact:    Noreen KATHEE Cleotilde ISRAEL Phone Number: 05/14/2024, 3:22 PM  Clinical Narrative:                  CSW spoke with patient at bedside. Patient reports that she lives alone and have supportive family members, such as her son. Patient shared that she has a walker and cane at home. CSW shared with patient that PT will come to assess her and give a recommendation. Patient expressed that she desires to return home with [facility] services, if recommended. CSW asked patient if there was anyone to contact and she stated that she was her own responsible party. CSW will continue to follow.     Barriers to Discharge: Continued Medical Work up   Patient Goals and CMS Choice Patient states their goals for this hospitalization and ongoing recovery are:: return home CMS Medicare.gov Compare Post Acute Care list provided to:: Patient Choice offered to / list presented to : Patient      Expected Discharge Plan and Services     Post Acute Care Choice: Durable Medical Equipment Living arrangements for the past 2 months: Single Family Home                                      Prior Living Arrangements/Services Living arrangements for the past 2 months: Single Family Home Lives with:: Self Patient language and need for interpreter reviewed:: Yes Do you feel safe going back to the place where you live?: Yes      Need for Family Participation in Patient Care: Yes (Comment) Care giver support system in place?: No (comment)   Criminal Activity/Legal Involvement Pertinent to Current Situation/Hospitalization: No - Comment as needed  Activities of Daily Living   ADL Screening (condition at time of admission) Independently performs ADLs?: Yes (appropriate for developmental age) Is the patient deaf or have difficulty hearing?:  No Does the patient have difficulty seeing, even when wearing glasses/contacts?: No Does the patient have difficulty concentrating, remembering, or making decisions?: No  Permission Sought/Granted      Share Information with NAME: Harini     Permission granted to share info w Relationship: Patient     Emotional Assessment Appearance:: Appears stated age Attitude/Demeanor/Rapport: Engaged Affect (typically observed): Accepting, Appropriate Orientation: : Oriented to Self, Oriented to Place, Oriented to Situation Alcohol / Substance Use: Not Applicable Psych Involvement: No (comment)  Admission diagnosis:  Back pain [M54.9] Patient Active Problem List   Diagnosis Date Noted   Back pain 05/13/2024   Compression fracture of fifth lumbar vertebra (HCC) 04/26/2024   Cachexia 04/05/2024   Difficulty with activities of daily living 04/05/2024   Physical deconditioning 04/05/2024   Chronic midline low back pain without sciatica 04/05/2024   Hypotension due to drugs 04/05/2024   Malignant neoplasm of urinary bladder (HCC) 07/29/2022   PCP:  Mancil Pfeiffer, PA-C Pharmacy:   [facility] [place], [place] - [id number] [place] [address] Phone: [phone] Fax: [phone]  [id number] - [place], [place] - [address] AT [facility] [address] [place] [address] Phone: [phone] Fax: [phone]     Social Drivers of Health (SDOH) Social History: SDOH Screenings   Food Insecurity:  No Food Insecurity (05/13/2024)  Recent Concern: Food Insecurity - Food Insecurity Present (04/14/2024)  Housing: Low Risk  (05/13/2024)  Transportation Needs: No Transportation Needs (05/13/2024)  Utilities: Not At Risk (05/13/2024)  Alcohol Screen: Low Risk  (09/12/2022)  Depression (PHQ2-9): Low Risk  (05/06/2024)  Recent Concern: Depression (PHQ2-9) - Medium Risk (04/26/2024)  Financial Resource Strain: Low Risk  (04/14/2024)   Physical Activity: Inactive (02/21/2022)  Social Connections: Unknown (05/13/2024)  Stress: Stress Concern Present (02/21/2022)  Tobacco Use: High Risk (05/13/2024)   SDOH Interventions:     Readmission Risk Interventions    05/14/2024    3:09 PM  Readmission Risk Prevention Plan  Transportation Screening Complete  Home Care Screening Complete  Medication Review (RN CM) Complete

## 2024-05-14 NOTE — Plan of Care (Signed)
  Problem: Acute Rehab PT Goals(only PT should resolve) Goal: Pt Will Go Supine/Side To Sit Outcome: Progressing Flowsheets (Taken 05/14/2024 1547) Pt will go Supine/Side to Sit: with contact guard assist Goal: Patient Will Transfer Sit To/From Stand Outcome: Progressing Flowsheets (Taken 05/14/2024 1547) Patient will transfer sit to/from stand: with contact guard assist Goal: Pt Will Transfer Bed To Chair/Chair To Bed Outcome: Progressing Flowsheets (Taken 05/14/2024 1547) Pt will Transfer Bed to Chair/Chair to Bed: with contact guard assist Goal: Pt Will Ambulate Outcome: Progressing Flowsheets (Taken 05/14/2024 1547) Pt will Ambulate:  10 feet  with rolling walker  with contact guard assist   3:47 PM, 05/14/24 Rosaria Settler, PT, DPT [place] with [facility]

## 2024-05-14 NOTE — Progress Notes (Signed)
 [facility] PROGRESS NOTE  Raven Lopez (DOB: [birth year]) FMW:[id number] PCP: Mancil Pfeiffer, PA-C Outpatient Specialists: Oncology, Dr. Davonna  Brief Narrative: Raven Lopez is a 68 y.o. female with a history of bladder CA s/p TURBT Feb 2024, declined cystectomy, s/p chemotherapy, XRT April-May 2024 lost to follow up, 1ppd tobacco use, anxiety, recent Dx L5 compression fracture who presented to the ED on 05/13/2024 with a couple days to a week of worsening severe lower back pain. She was found to have multiple metastatic lesions that have progressed including bony metastatic disease. Subsequently, she was admitted for intractable pain and debility. Oncology is consulted, opioid analgesics are being titrated, and rehabilitation is being pursued.   Subjective: Pain uncontrolled, but very reluctant to add medications. Has exceeding difficulty with actual pills, taking 30 minutes at times to swallow. Not eating much, hasn't been able to get up to watch TV or to the computer in the past 1-2 weeks due to pain and growing weakness.   Objective: BP 129/87   Pulse 79   Temp 97.7 F (36.5 C)   Resp 18   Ht 5' 2 (1.575 m)   Wt 38.1 kg   SpO2 96%   BMI 15.36 kg/m   Gen: Cachectic 68yo F in no distress Pulm: Nonlabored, clear  CV: RRR, no MRG, no edema GI: Soft, NT, ND, +BS  Neuro: Alert and oriented, SILT LE's, strength intact, though diffusely quite weak. No new focal deficits. Ext: Warm, no deformities Skin: No rashes, lesions or ulcers on visualized skin   Assessment & Plan:  Intractable back pain due to vertebral metastatic disease and pathologic compression fractures: Neuro exam reveals no deficits.  - Great difficulty swallowing, chronic issue. Will switch medications to solution where possible. This includes oxycodone , will add fentanyl  patch at lowest dose. Pt very reluctant to add medications, so will hold off on additional medications for now. Still make dilaudid  IV  available for breakthrough pain.  - D/w IR Re: kyphoplasty. They recommend referral for outpatient discussion of OsteoCool (RFA) +/- KP.  - Discussed the role in palliative care services going forward to which the patient and family agree. Will place referral at discharge.  - Continue PT/OT, pt/family are amenable to rehabilitation facility placement prior to returning home to maximize functional mobility.   Metastatic bladder CA:  - Oncology consulted, appreciate their assessment. Will follow up 11/20 as planned. Pt contemplating another round of chemotherapy.   UTI, complicated:  - Continue abx with levaquin, additionally diflucan  for candidiasis - Monitor culture and leukocytosis in AM.   Severe protein-calorie malnutrition, cachexia, hypoalbuminemia, generalized weakness, failure to thrive: BMI is 15, very deconditioned.  - PT/OT consulted, will pursue SNF rehabilitation - RD consulted. Pt unable to tolerate large pills (e.g. multivitamins), will push protein in the form of ensure high protein TID, magic cup TID.   Tobacco use:  - Nicotine patch, cessation counseling  AKI: SCr 1.05 on admission which actually represents significant renal impairment in light of her sarcopenia.  - Continue IVF  Bernardino KATHEE Come, MD [facility] [email or web address] 05/14/2024, 1:00 PM

## 2024-05-14 NOTE — Plan of Care (Signed)
   Problem: Education: Goal: Knowledge of General Education information will improve Description Including pain rating scale, medication(s)/side effects and non-pharmacologic comfort measures Outcome: Progressing   Problem: Health Behavior/Discharge Planning: Goal: Ability to manage health-related needs will improve Outcome: Progressing

## 2024-05-14 NOTE — Progress Notes (Signed)
 Initial Nutrition Assessment  DOCUMENTATION CODES:   Underweight  INTERVENTION:   -Continue regular diet -MVI with minerals daily -Ensure Plus High Protein po TID, each supplement provides 350 kcal and 20 grams of protein  -Magic cup TID with meals, each supplement provides 290 kcal and 9 grams of protein  -Palliative care and oncology consults pending for goals of care discussions. If patient desires full scope care, may need to consider alterative means of nutrition/ hydration if this aligns with goals of care. If this is pursed, recommend:   Initiate Osmolite 1.2 @ 20 ml/hr and increase by 10 ml every 12 hours to goal rate of 50 ml/hr.   60 ml Prosource TF20 daily   Tube feeding regimen provides 1520 kcal (100% of needs), 87 grams of protein, and 984 ml of H2O.    -If feedings are pursued, recommend: 100 mg thiamine daily x 7 days and monitor Mg, K, and Phos and replete as needed secondary to high refeeding risk   NUTRITION DIAGNOSIS:   Increased nutrient needs related to cancer and cancer related treatments as evidenced by estimated needs.  GOAL:   Patient will meet greater than or equal to 90% of their needs  MONITOR:   PO intake, Supplement acceptance  REASON FOR ASSESSMENT:   Malnutrition Screening Tool    ASSESSMENT:   68 y.o. female with medical history significant for bladder cancer(07/29/22) status post chemotherapy and radiation therapy, generalized anxiety disorder, current tobacco user (1ppd), who presents due to severe back pain.  Patient admitted with back pain secondary to metastatic disease.   Reviewed I/O's: +40 ml x 24 hours  UOP: 200 ml x 24 hours  Patient unavailable at time of visit. Attempted to speak with patient via call to hospital room phone, however, unable to reach. RD unable to obtain further nutrition-related history or complete nutrition-focused physical exam at this time.    Per H&P, patient has had back pain over the past 2 months.  This has been worsening for 5 days PTA. Patient was diagnosed with compression fracture on 140/10/25. No recent oncology visits; MD has requested oncology and palliative care consults to further discuss goals of care.   Patient currently on a regular diet. No meal completion data available to assess at this time.   Case discussed with RN, who reports poor appetite. She refused Ensure and MVI. She states patient has had difficulty swallowing large pills and is willing to take MVI in another form (such as a gummy vitamin). RD informed RN that there are no gummy vitamins on formulary, but could try liquids MVI to see if patient better tolerates.   Reviewed weight history; no weight loss noted over the past 9 months.   Medications reviewed and include lovenox and lactated ringers  infusion @ 50 ml/hr.   Labs reviewed: Na: 133.    Diet Order:   Diet Order             Diet regular Room service appropriate? Yes; Fluid consistency: Thin  Diet effective now                   EDUCATION NEEDS:   No education needs have been identified at this time  Skin:  Skin Assessment: Reviewed RN Assessment  Last BM:  05/11/24  Height:   Ht Readings from Last 1 Encounters:  05/13/24 5' 2 (1.575 m)    Weight:   Wt Readings from Last 1 Encounters:  05/13/24 38.1 kg    Ideal Body  Weight:  50 kg  BMI:  Body mass index is 15.36 kg/m.  Estimated Nutritional Needs:   Kcal:  1500-1700  Protein:  75-90 grams  Fluid:  1.5-1.7 L    Margery ORN, RD, LDN, CDCES Registered Dietitian III Certified Diabetes Care and Education Specialist If unable to reach this RD, please use RD Inpatient group chat on secure chat between hours of 8am-4 pm daily

## 2024-05-14 NOTE — Telephone Encounter (Signed)
 Verbal order given to Amy at [facility]

## 2024-05-14 NOTE — Plan of Care (Signed)
 Request to IR for possible kyphoplasty - per Dr. Karalee patient would likely benefit from Osteocool (includes biopsy, radiofrequency ablation and kyphoplasty) as an outpatient.   Discussed with Dr. Bryn today, will place a referral to [facility] for consultation with us  ASAP after discharge. IR contact information in AVS if patient has questions prior to her appointment.  No plans for inpatient IR procedure, please call/Epic chat with questions or concerns.  Clotilda Hesselbach, PA-C

## 2024-05-14 NOTE — Consult Note (Cosign Needed Addendum)
 [facility] Consultation Hematology/Oncology  CONSULTING PHYSICIAN: Niels Hurst, DO  REASON FOR CONSULT: Bladder cancer    HISTORY OF PRESENT ILLNESS:   Raven Lopez is a 68 y.o. female with past medical history of bladder cancer(07/29/22) status post chemotherapy and radiation therapy, generalized anxiety disorder, current tobacco user (1ppd), who presents to the ER from home due to severe back pain.  Her back pain has been ongoing for the past 2 months and worsening in the last 2 days.  Denies any weakness, numbness, or tingling in her extremities.  Denies loss of sensation with bowel or bladder.  Was diagnosed with compression fracture at L5 on 04/16/2024.  The pain has been progressively worsening.  It is so severe that she has not been able to get out of bed without assistance.  Has also been eating less.  Has not seen her oncologist recently.  Status post TURBT with Dr. Sherrilee on 08/29/2022 which showed infiltrating high-grade urothelial carcinoma invading muscularis propria.  CT scan from 09/26/2022 showed interval resection of the mass of the superior right aspect of the urinary bladder with residual posterior bladder dome and right hyperenhancing wall thickening.  No adenopathy or metastatic disease.  Patient did not want to radical cystectomy.  She met with Dr. Dannielle at [facility] and went to proceed with trimodality therapy including 5-FU plus mitomycin  and XRT from 10/30/2022 through 12/01/2022.   She had a repeated CT chest abdomen pelvis with contrast which revealed interval development of metastatic disease in the chest, abdomen, and pelvis.  A 1.4 x 1.1 cm hypoechoic enhancing mass in the proximal pancreatic may represent metastatic disease versus primary pancreatic adenocarcinoma.  Metastatic osseous disease involving L5 with associated pathologic fracture and posterior buckling and focal narrowing of the central canal.  Aortic atherosclerosis.   Due to uncontrolled pain in  the ER, EDP requested admission for further pain control.  Admitted by [facility], hospitalist service.  She was also found to have a UTI and yeast infection and was started on antibiotics and antifungals.   She was last seen by Dr. Rogers on 08/05/2023 for stage II urothelial carcinoma of the bladder.  She was on a 23-month rotation with CT scans and lab work.  CT scan prior to this admission was on 06/23/2023 which showed questionable asymmetric wall thickening along the urinary bladder, new round soft tissue density along the left external iliac artery which was likely to reflect her left ovary difficult to visualize.  Irregular 5 mm pulmonary nodule in the medial aspect of the left upper lobe.  It was recommended she have short-term interval follow-up.  She was scheduled for CT scan in 6 months.  Was never completed.  Today, she continues to have back pain but it is better controlled with Dilaudid  and recently added fentanyl  patch.  Reports appetite is low.  Reports she was unable to bear weight prior to her admission due to pain.  Denies dbdominal pain, chest pain or shortness of breath.  Reports she is just very tired.  MEDICATIONS: I have reviewed the patient's current medications.     PERFORMANCE STATUS: The patient's performance status is 0 - Asymptomatic  PHYSICAL EXAM: Most Recent Vital Signs: Blood pressure (!) 134/103, pulse 92, temperature 97.8 F (36.6 C), temperature source Oral, resp. rate 12, height 6' (1.829 m), weight 270 lb (122.5 kg), SpO2 91%.  Physical Exam Vitals reviewed.  Constitutional:      Appearance: Normal appearance. She is underweight. She is ill-appearing.  Cardiovascular:     Rate and Rhythm: Normal rate and regular rhythm.  Pulmonary:     Effort: Pulmonary effort is normal.     Breath sounds: Normal breath sounds.  Abdominal:     General: Bowel sounds are normal.     Palpations: Abdomen is soft.  Musculoskeletal:        General: No swelling. Normal range of  motion.  Neurological:     Mental Status: She is alert and oriented to person, place, and time. Mental status is at baseline.        LABORATORY DATA:   Last CBC Lab Results  Component Value Date   WBC 14.6 (H) 05/14/2024   HGB 9.4 (L) 05/14/2024   HCT 30.1 (L) 05/14/2024   MCV 88.5 05/14/2024   MCH 27.6 05/14/2024   RDW 15.5 05/14/2024   PLT 346 05/14/2024     Last metabolic panel Lab Results  Component Value Date   GLUCOSE 98 05/14/2024   NA 133 (L) 05/14/2024   K 4.4 05/14/2024   CL 101 05/14/2024   CO2 22 05/14/2024   BUN 17 05/14/2024   CREATININE 0.90 05/14/2024   GFRNONAA >60 05/14/2024   CALCIUM 8.0 (L) 05/14/2024   PHOS 3.1 05/14/2024   PROT 5.8 (L) 05/14/2024   ALBUMIN 2.7 (L) 05/14/2024   BILITOT 0.8 05/14/2024   ALKPHOS 297 (H) 05/14/2024   AST 40 05/14/2024   ALT 21 05/14/2024   ANIONGAP 10 05/14/2024      RADIOGRAPHY: CT CHEST ABDOMEN PELVIS W CONTRAST CLINICAL DATA:  History of bladder cancer. Metastatic disease evaluation.  EXAM: CT CHEST, ABDOMEN, AND PELVIS WITH CONTRAST  TECHNIQUE: Multidetector CT imaging of the chest, abdomen and pelvis was performed following the standard protocol during bolus administration of intravenous contrast.  RADIATION DOSE REDUCTION: This exam was performed according to the departmental dose-optimization program which includes automated exposure control, adjustment of the mA and/or kV according to patient size and/or use of iterative reconstruction technique.  CONTRAST:  OMNIPAQUE  IOHEXOL  300 MG/ML  SOLN  COMPARISON:  CT dated 06/23/2023.  FINDINGS: CT CHEST FINDINGS  Cardiovascular: There is no cardiomegaly or pericardial effusion. There is coronary vascular calcification and calcification of the mitral annulus. Right-sided Port-A-Cath with tip in the right atrium. Moderate atherosclerotic calcification of the thoracic aorta. No aneurysmal dilatation or dissection. The central  pulmonary arteries are patent.  Mediastinum/Nodes: No hilar adenopathy. Subcarinal adenopathy measures 2.2 cm in short axis new since the prior CT. The esophagus is grossly unremarkable. No mediastinal fluid collection.  Lungs/Pleura: A 4 mm nodule in the left lower lobe (120/7) and new since the prior CT. A cluster of nodular density in the anterior left lung base (114/7) may be inflammatory or infectious in etiology. A 6 mm left upper lobe nodule (45/7) present on the prior CT. No consolidative changes. Trace right pleural effusion. No pneumothorax. The central airways are patent.  Musculoskeletal: Osteopenia with degenerative changes of the spine. Old T8 and T9 compression fractures. No acute osseous pathology.  CT ABDOMEN PELVIS FINDINGS  No intra-abdominal free air.  Small ascites.  Hepatobiliary: Large hypoenhancing masses throughout the liver consistent with metastatic disease and new since the prior CT. The gallbladder is distended. There is dilatation of the common bile duct measuring up to 17 mm in caliber. No stone noted in the gallbladder or in the bowel duct.  Pancreas: A 1.4 x 1.1 cm hypoenhancing mass in the proximal pancreas may represent metastatic disease versus primary pancreatic  adenocarcinoma. There is mild dilatation of the main pancreatic duct.  Spleen: Normal in size without focal abnormality.  Adrenals/Urinary Tract: The adrenal glands unremarkable. Right renal inferior pole cyst. There is no hydronephrosis on either side there is symmetric enhancement and excretion of contrast by both kidneys. The urinary bladder is minimally distended. Trabeculated versus thickened right bladder dome.  Stomach/Bowel: Moderate stool throughout the colon. No bowel obstruction. No evidence of acute appendicitis.  Vascular/Lymphatic: Advanced aortoiliac atherosclerotic disease. The IVC is unremarkable. No portal venous gas. Retroperitoneal adenopathy measure up to 2  cm in short axis posterior to the IVC and 16 mm short axis posterior to the main portal vein. Right iliac chain or pelvic sidewall adenopathy/implant measures 2.4 x 2.0 cm (93/4). Additional left iliac chain and retroperitoneal adenopathy.  Reproductive: The uterus is grossly unremarkable.  Other: Diffuse subcutaneous edema.  Musculoskeletal: Osteopenia with degenerative changes of the spine. Mixed density, predominantly sclerotic lesion involving the L5 with pathologic fracture and approximately 50% loss of vertebral body height. There is buckling of the posterior cortex with approximately 5 mm retropulsion and focal narrowing of the central canal. Extension of the infiltrative osseous mass/metastasis into the left L5 pedicle and probable extension into the left L5-S1 neural foramina.  IMPRESSION: 1. Interval development of metastatic disease in the chest, abdomen, and pelvis as above. 2. A 1.4 x 1.1 cm hypoenhancing mass in the proximal pancreas may represent metastatic disease versus primary pancreatic adenocarcinoma. 3. Metastatic osseous disease involving L5 with associated pathologic fracture and posterior buckling and focal narrowing of the central canal. Aortic Atherosclerosis (ICD10-I70.0).  Electronically Signed   By: Vanetta Chou M.D.   On: 05/13/2024 19:02 MR Lumbar Spine W Wo Contrast EXAM: MRI LUMBAR SPINE 05/13/2024 05:59:24 PM  TECHNIQUE: Multiplanar multisequence MRI of the lumbar spine was performed with and without the administration of intravenous contrast. 4 mL of gadobutrol (GADAVIST) 1 MMOL/ML injection was administered.  COMPARISON: CT of the lumbar spine 04/16/2024.  CLINICAL HISTORY: Low back pain. Clinical history is abnormal CT of the lumbar spine and low back pain.  FINDINGS:  BONES AND ALIGNMENT: Normal alignment. Normal vertebral body heights *except for the compression fracture at L5 and superior endplate fracture at L3*.  Superior endplate Schmorl nodes are present at T12, L1, and L2.  SPINAL CORD: The conus terminates normally.  SOFT TISSUES: No paraspinal mass. Enlarged paraaortic lymph nodes measure up to 20 mm on the right and 21 mm on the left. Dilated small bowel present in the right upper quadrant. Atherosclerotic changes are present in the abdominal aorta; largest transverse diameter is 2.8 cm.  T12-L1: Superior endplate Schmorl node at T12. No significant disc herniation. No spinal canal stenosis or neural foraminal narrowing.  L1-L2: Superior endplate Schmorl node at L1. No significant disc herniation. No spinal canal stenosis or neural foraminal narrowing.  L2-L3: Superior endplate Schmorl node at L2. No significant disc herniation. No spinal canal stenosis or neural foraminal narrowing.  L3-L4: Superior endplate fracture at L3 is similar to the prior exam. An enhancing mass lesion in the left anterior L3 vertebral body is more obvious than on the prior study, measuring 18 x 13 mm on sagittal images. An enhancing lesion in the right posterolateral L4 vertebral body extends into the pedicle without fracture. A 12 mm lesion is present in the anterior inferior L4 vertebral body. No significant disc herniation. No spinal canal stenosis or neural foraminal narrowing.  L4-L5: Diffuse marrow signal abnormality and enhancement is present within  the L5 vertebral body. A compression fracture has progressed with minimal height now measuring 12 mm compared to 14 mm previously. Compressed bone extends posteriorly into the spinal canal. Abnormal signal and enhancement extends into the pedicles, left greater than right. No significant disc herniation. No spinal canal stenosis or neural foraminal narrowing.  L5-S1: No significant disc herniation. No spinal canal stenosis or neural foraminal narrowing.  IMPRESSION: 1. Multiple osseous lesions consistent with metastatic disease. 2. Progression  of L5 compression fracture with minimal additional height loss, with abnormal signal and enhancement extending into the pedicles and bone retropulsed posteriorly into the spinal canal. 3. Enhancing lesions at L4 involving the right posterolateral vertebral body and pedicle and a separate 12 mm lesion in the anterior inferior L4 vertebral body. 4. New L3 superior endplate fracture with an enhancing mass in the left anterior L3 vertebral body measuring 18 x 13 mm. 5. Enlarged paraaortic lymph nodes measuring up to 21 mm, also consistent with metastatic disease these have increased in size since the prior study.  Electronically signed by: Lonni Necessary MD 05/13/2024 06:25 PM EST RP Workstation: [id number]   ASSESSMENT: Patient recently admitted to [facility] for back pain prompting imaging which unfortunately shows multiple osseous lesions consistent with metastatic disease.  PLAN:  Metastatic bladder cancer Discussed goals of care-she is unsure at this time on proceeding with additional treatment versus palliative/hospice. Aggressive pain control. She is unsure if she is interested in systemic chemotherapy versus just palliative radiation. IR is recommending osteo cool procedure.  She is already scheduled for follow-up on 05/27/2024. Sounds like she will be going to a skilled nursing facility following this admission. Follow-up as scheduled.  Thank you for involving us  in this patient's care.  Please to reach out with any questions or concerns.  Delon Hope, AGNP-C Hematology/Oncology [facility] at [facility]

## 2024-05-14 NOTE — Evaluation (Signed)
 Physical Therapy Evaluation Patient Details Name: Raven Lopez MRN: [id number] DOB: 25-Oct-1955 Today's Date: 05/14/2024  History of Present Illness  Raven Lopez is a 68 y.o. female with medical history significant for bladder cancer(07/29/22) status post chemotherapy and radiation therapy, generalized anxiety disorder, current tobacco user (1ppd), who presents to the ER from home due to severe back pain.  Her back pain has been ongoing for the past 2 months and worsening in the last 2 days.  Denies any weakness, numbness, or tingling in her extremities.  Denies loss of sensation with bowel or bladder.  Was diagnosed with compression fracture at L5 on 04/16/2024.  The pain has been progressively worsening.  It is so severe that she has not been able to get out of bed without assistance.  Has also been eating less.  Has not seen her oncologist recently.   Clinical Impression  Patient agreeable to PT evaluation. Patient's son and sister are present during session. Patient's son contributes to history taking. They report at baseline, patient is at least a household ambulator with RW, and independent with ADL. Family assists with iADLs. Reports no falls. This date, patient requires min assist for bed mobility. Educated on log rolling technique to avoid excessive pain in low back during mobility. Pt politely requests to hold further mobility as back pain increases to 8/10 from 6/10. Returns to bed. Pt able to slightly assist with bridging bottom to scoot to [facility]. Pt is limited mostly due to pain, but also general weakness, and dec endurance as she appears generally frail. Pt remains in bed at end of session, family present, call button within reach. Patient will benefit from continued skilled physical therapy acutely and in recommended venue in order to address current deficits and improve overall function.        If plan is discharge home, recommend the following: A little help with walking and/or  transfers;Assist for transportation;Assistance with cooking/housework;A little help with bathing/dressing/bathroom;Help with stairs or ramp for entrance   Can travel by private vehicle        Equipment Recommendations None recommended by PT  Recommendations for Other Services       Functional Status Assessment Patient has had a recent decline in their functional status and demonstrates the ability to make significant improvements in function in a reasonable and predictable amount of time.     Precautions / Restrictions Precautions Precautions: Fall Recall of Precautions/Restrictions: Intact Restrictions Weight Bearing Restrictions Per Provider Order: No      Mobility  Bed Mobility Overal bed mobility: Needs Assistance Bed Mobility: Supine to Sit, Sit to Supine     Supine to sit: Min assist Sit to supine: Min assist   General bed mobility comments: min assist and verbal cueing for log rolling to avoid excessive low back pain. assist at trunk and BLE. pt able to assist with scotting to [facility] through bridge technique while using hand rails but limited due to pain    Transfers     General transfer comment: pt declined further mobility due to inc pain,  reports inc to 8/10    Ambulation/Gait   General Gait Details: pt declined further mobility due to inc pain, reports inc to 8/10  Stairs            Wheelchair Mobility     Tilt Bed    Modified Rankin (Stroke Patients Only)       Balance Overall balance assessment: Needs assistance Sitting-balance support: Feet supported, Bilateral upper extremity  supported Sitting balance-Leahy Scale: Fair Sitting balance - Comments: while seated EOB       Standing balance comment: pt declined further mobility due to inc pain, reports inc to 8/10         Pertinent Vitals/Pain Pain Assessment Pain Assessment: 0-10 Pain Score: 6  Pain Location: low back and LLE Pain Descriptors / Indicators: Aching, Grimacing,  Discomfort Pain Intervention(s): Limited activity within patient's tolerance, Repositioned, Monitored during session    Home Living Family/patient expects to be discharged to:: Private residence Living Arrangements: Alone Available Help at Discharge: Family;Available 24 hours/day Type of Home: House Home Access: Stairs to enter Entrance Stairs-Rails: Can reach both Entrance Stairs-Number of Steps: 2 STE in front without rails and 4-5 STE in back with rails   Home Layout: One level Home Equipment: [employer] (2 wheels);Cane - single point;Wheelchair - manual;BSC/3in1;Shower seat;Grab bars - tub/shower Additional Comments: Reports she has toilet riser, and suction grab bars in shower. Pt's son and sister live next door and across the street.    Prior Function Prior Level of Function : Independent/Modified Independent     Mobility Comments: reports as a household ambulator mostly using RW, hasn't driven in a month, no falls ADLs Comments: independent with ADLs, assisted with iADLs.     Extremity/Trunk Assessment   Upper Extremity Assessment Upper Extremity Assessment: Generalized weakness (formal testing limited due to inc pain in low back and LLE, pt generally weak and frail appearing)    Lower Extremity Assessment Lower Extremity Assessment: Generalized weakness (formal testing limited due to inc pain in low back and LLE, pt generally weak and frail appearing)    Cervical / Trunk Assessment Cervical / Trunk Assessment: Kyphotic  Communication   Communication Communication: No apparent difficulties    Cognition Arousal: Alert Behavior During Therapy: WFL for tasks assessed/performed, Flat affect       Following commands: Intact       Cueing Cueing Techniques: Verbal cues, Visual cues     General Comments      Exercises     Assessment/Plan    PT Assessment Patient needs continued PT services;All further PT needs can be met in the next venue of care  PT  Problem List Decreased strength;Decreased mobility;Decreased activity tolerance;Pain       PT Treatment Interventions DME instruction;Gait training;Stair training;Functional mobility training;Therapeutic activities;Therapeutic exercise;Balance training;Patient/family education    PT Goals (Current goals can be found in the Care Plan section)  Acute Rehab PT Goals Patient Stated Goal: return home PT Goal Formulation: With patient Time For Goal Achievement: 05/21/24 Potential to Achieve Goals: Good    Frequency Min 3X/week     Co-evaluation               AM-PAC PT 6 Clicks Mobility  Outcome Measure Help needed turning from your back to your side while in a flat bed without using bedrails?: A Little Help needed moving from lying on your back to sitting on the side of a flat bed without using bedrails?: A Little Help needed moving to and from a bed to a chair (including a wheelchair)?: A Lot Help needed standing up from a chair using your arms (e.g., wheelchair or bedside chair)?: A Lot Help needed to walk in hospital room?: A Lot Help needed climbing 3-5 steps with a railing? : A Lot 6 Click Score: 14    End of Session   Activity Tolerance: Patient limited by pain Patient left: in bed;with call bell/phone within reach;with family/visitor  present   PT Visit Diagnosis: Other abnormalities of gait and mobility (R26.89);Muscle weakness (generalized) (M62.81);Pain;Difficulty in walking, not elsewhere classified (R26.2) Pain - Right/Left: Left Pain - part of body: Leg (and low back)    Time: 8848-8792 PT Time Calculation (min) (ACUTE ONLY): 16 min   Charges:   PT Evaluation $PT Eval Low Complexity: 1 Low   PT General Charges $$ ACUTE PT VISIT: 1 Visit        3:46 PM, 05/14/24 Rajat Staver Powell-Butler, PT, DPT [place] with [facility]

## 2024-05-15 DIAGNOSIS — M545 Low back pain, unspecified: Secondary | ICD-10-CM | POA: Diagnosis not present

## 2024-05-15 DIAGNOSIS — Z515 Encounter for palliative care: Secondary | ICD-10-CM | POA: Diagnosis not present

## 2024-05-15 LAB — COMPREHENSIVE METABOLIC PANEL WITH GFR
ALT: 20 U/L (ref 0–44)
AST: 38 U/L (ref 15–41)
Albumin: 2.7 g/dL — ABNORMAL LOW (ref 3.5–5.0)
Alkaline Phosphatase: 317 U/L — ABNORMAL HIGH (ref 38–126)
Anion gap: 9 (ref 5–15)
BUN: 14 mg/dL (ref 8–23)
CO2: 25 mmol/L (ref 22–32)
Calcium: 8.3 mg/dL — ABNORMAL LOW (ref 8.9–10.3)
Chloride: 101 mmol/L (ref 98–111)
Creatinine, Ser: 0.88 mg/dL (ref 0.44–1.00)
GFR, Estimated: >60 mL/min (ref >60)
Glucose, Bld: 108 mg/dL — ABNORMAL HIGH (ref 70–99)
Potassium: 4.7 mmol/L (ref 3.5–5.1)
Sodium: 135 mmol/L (ref 135–145)
Total Bilirubin: 0.8 mg/dL (ref 0.0–1.2)
Total Protein: 5.8 g/dL — ABNORMAL LOW (ref 6.5–8.1)

## 2024-05-15 LAB — CBC WITH DIFFERENTIAL/PLATELET
Abs Immature Granulocytes: 0.14 K/uL — ABNORMAL HIGH (ref 0.00–0.07)
Basophils Absolute: 0.1 K/uL (ref 0.0–0.1)
Basophils Relative: 0 %
Eosinophils Absolute: 0.2 K/uL (ref 0.0–0.5)
Eosinophils Relative: 1 %
HCT: 29.8 % — ABNORMAL LOW (ref 36.0–46.0)
Hemoglobin: 9.4 g/dL — ABNORMAL LOW (ref 12.0–15.0)
Immature Granulocytes: 1 %
Lymphocytes Relative: 6 %
Lymphs Abs: 0.9 K/uL (ref 0.7–4.0)
MCH: 27.7 pg (ref 26.0–34.0)
MCHC: 31.5 g/dL (ref 30.0–36.0)
MCV: 87.9 fL (ref 80.0–100.0)
Monocytes Absolute: 1.4 K/uL — ABNORMAL HIGH (ref 0.1–1.0)
Monocytes Relative: 9 %
Neutro Abs: 12.5 K/uL — ABNORMAL HIGH (ref 1.7–7.7)
Neutrophils Relative %: 83 %
Platelets: 353 K/uL (ref 150–400)
RBC: 3.39 MIL/uL — ABNORMAL LOW (ref 3.87–5.11)
RDW: 15.3 % (ref 11.5–15.5)
WBC: 15.2 K/uL — ABNORMAL HIGH (ref 4.0–10.5)
nRBC: 0 % (ref 0.0–0.2)

## 2024-05-15 MED ORDER — NYSTATIN 100000 UNIT/ML MT SUSP
5.0000 mL | Freq: Four times a day (QID) | OROMUCOSAL | Status: DC
  Administered 2024-05-15 – 2024-05-17 (×8): 500000 [IU] via ORAL
  Filled 2024-05-15 (×8): qty 5

## 2024-05-15 MED ORDER — SODIUM CHLORIDE 0.45 % IV SOLN: INTRAVENOUS | Status: DC

## 2024-05-15 MED ORDER — FLUCONAZOLE 40 MG/ML PO SUSR
150.0000 mg | Freq: Once | ORAL | Status: AC
  Administered 2024-05-15: 152 mg via ORAL
  Filled 2024-05-15: qty 3.8

## 2024-05-15 MED ORDER — CEPHALEXIN 250 MG/5ML PO SUSR
500.0000 mg | Freq: Three times a day (TID) | ORAL | Status: DC
  Administered 2024-05-15 – 2024-05-17 (×5): 500 mg via ORAL
  Filled 2024-05-15 (×8): qty 10

## 2024-05-15 MED ORDER — FLUCONAZOLE 40 MG/ML PO SUSR
100.0000 mg | Freq: Every day | ORAL | Status: DC
  Filled 2024-05-15: qty 2.5

## 2024-05-15 NOTE — NC FL2 (Signed)
 Raven Lopez  MEDICAID [id number] LEVEL OF CARE FORM     IDENTIFICATION  Patient Name: Raven Lopez Birthdate: Dec 27, 1955 Sex: female Admission Date (Current Location): 05/13/2024  [facility] and [place] Number:  Raven Lopez and Address:  [facility],  [address] [place] [address]      Provider Number: [id number]  Attending Physician Name and Address:  Bryn Bernardino NOVAK, MD  Relative Name and Phone Number:  Raven Lopez [phone]    Current Level of Care: Hospital Recommended Level of Care: Skilled Nursing Facility Prior Approval Number:    Date Approved/Denied:   PASRR Number:    Discharge Plan: SNF    Current Diagnoses: Patient Active Problem List   Diagnosis Date Noted   Back pain 05/13/2024   Compression fracture of fifth lumbar vertebra (HCC) 04/26/2024   Cachexia 04/05/2024   Difficulty with activities of daily living 04/05/2024   Physical deconditioning 04/05/2024   Chronic midline low back pain without sciatica 04/05/2024   Hypotension due to drugs 04/05/2024   Malignant neoplasm of urinary bladder (HCC) 07/29/2022    Orientation RESPIRATION BLADDER Height & Weight     Self, Situation, Place  Normal External catheter Weight: 84 lb (38.1 kg) Height:  5' 2 (157.5 cm)  BEHAVIORAL SYMPTOMS/MOOD NEUROLOGICAL BOWEL NUTRITION STATUS      Continent Diet (regular)  AMBULATORY STATUS COMMUNICATION OF NEEDS Skin   Limited Assist Verbally Normal                       Personal Care Assistance Level of Assistance  Bathing, Feeding, Dressing Bathing Assistance: Limited assistance Feeding assistance: Independent Dressing Assistance: Limited assistance     Functional Limitations Info  Sight, Hearing, Speech Sight Info: Adequate Hearing Info: Adequate Speech Info: Adequate    SPECIAL CARE FACTORS FREQUENCY  PT (By licensed PT), OT (By licensed OT)     PT Frequency: 5 x a week OT Frequency: 5 x a week            Contractures  Contractures Info: Not present    Additional Factors Info  Code Status, Allergies Code Status Info: FULL Allergies Info: Penicillins, aspirin, Demerol (meperidine Hcl), Sulfonamide Derivatives           Current Medications (05/15/2024):  This is the current hospital active medication list Current Facility-Administered Medications  Medication Dose Route Frequency Provider Last Rate Last Admin   acetaminophen  (TYLENOL ) tablet 500 mg  500 mg Oral Q6H PRN Shona Laurence N, DO       artificial tears ophthalmic solution 1 drop  1 drop Both Eyes PRN Bryn Bernardino NOVAK, MD   1 drop at 05/14/24 1443   Chlorhexidine  Gluconate Cloth 2 % PADS 6 each  6 each Topical Daily Bryn Bernardino NOVAK, MD   6 each at 05/15/24 1052   diazepam  (VALIUM ) 1 MG/ML solution 5 mg  5 mg Oral Q8H PRN Bryn Bernardino NOVAK, MD       enoxaparin (LOVENOX) injection 30 mg  30 mg Subcutaneous Q24H Hall, Carole N, DO   30 mg at 05/13/24 2153   feeding supplement (ENSURE PLUS HIGH PROTEIN) liquid 237 mL  237 mL Oral TID BM Bryn Bernardino NOVAK, MD       fentaNYL  (DURAGESIC ) 12 MCG/HR 1 patch  1 patch Transdermal Q72H Grunz, Ryan B, MD   1 patch at 05/14/24 1349   HYDROmorphone  (DILAUDID ) injection 0.5 mg  0.5 mg Intravenous Q4H PRN Bryn Bernardino NOVAK, MD  0.5 mg at 05/15/24 1100   levofloxacin (LEVAQUIN) IVPB 750 mg  750 mg Intravenous Q48H Shona Laurence N, DO 100 mL/hr at 05/13/24 2204 750 mg at 05/13/24 2204   melatonin tablet 6 mg  6 mg Oral QHS PRN Shona Laurence SAILOR, DO       nicotine (NICODERM CQ - dosed in mg/24 hours) patch 14 mg  14 mg Transdermal Daily Shona Laurence N, DO   14 mg at 05/15/24 1052   oxyCODONE  (ROXICODONE ) 5 MG/5ML solution 5-10 mg  5-10 mg Oral Q4H PRN Bryn Bernardino NOVAK, MD       polyethylene glycol (MIRALAX / GLYCOLAX) packet 17 g  17 g Oral Daily PRN Shona Laurence N, DO       prochlorperazine  (COMPAZINE ) injection 5 mg  5 mg Intravenous Q6H PRN Hall, Carole N, DO   5 mg at 05/14/24 2001   sodium chloride  flush (NS) 0.9 % injection 10-40 mL   10-40 mL Intracatheter Q12H Grunz, Ryan B, MD   10 mL at 05/15/24 1053   sodium chloride  flush (NS) 0.9 % injection 10-40 mL  10-40 mL Intracatheter PRN Bryn Bernardino NOVAK, MD         Discharge Medications: Please see discharge summary for a list of discharge medications.  Relevant Imaging Results:  Relevant Lab Results:   Additional Information SSN: [id number]  Raven Lopez [place], [place]

## 2024-05-15 NOTE — Progress Notes (Signed)
 Patient's son called me into the room inquiring about a fentanyl  patch because his mother was in a lot of pain. He stated that his mother had a fentanyl  patch on that was placed yesterday afternoon. Both Avelina Senters RN and I searched for the fentanyl  patch and we could not find it.New patch applied at 1712.

## 2024-05-15 NOTE — TOC Progression Note (Signed)
 Transition of Care [facility]) - Progression Note    Patient Details  Name: Raven Lopez MRN: [id number] Date of Birth: [birth year]  Transition of Care [facility]) CM/SW Contact  Noreen KATHEE [place], [place] Phone Number: 05/15/2024, 11:43 AM  Clinical Narrative:     CSW spoke with patient at bedside regarding PT recommendation for SNF. Patient agreeable to CSW sending her referral out to [facility], [facility], [facility], and [facility]. CSW will start auth , pending facility. CSW will continue to follow.  Expected Discharge Plan: Skilled Nursing Facility Barriers to Discharge: Continued Medical Work up               Expected Discharge Plan and Services     Post Acute Care Choice: Durable Medical Equipment Living arrangements for the past 2 months: Single Family Home                                       Social Drivers of Health (SDOH) Interventions SDOH Screenings   Food Insecurity: No Food Insecurity (05/13/2024)  Recent Concern: Food Insecurity - Food Insecurity Present (04/14/2024)  Housing: Low Risk  (05/13/2024)  Transportation Needs: No Transportation Needs (05/13/2024)  Utilities: Not At Risk (05/13/2024)  Alcohol Screen: Low Risk  (09/12/2022)  Depression (PHQ2-9): Low Risk  (05/06/2024)  Recent Concern: Depression (PHQ2-9) - Medium Risk (04/26/2024)  Financial Resource Strain: Low Risk  (04/14/2024)  Physical Activity: Inactive (02/21/2022)  Social Connections: Unknown (05/13/2024)  Stress: Stress Concern Present (02/21/2022)  Tobacco Use: High Risk (05/13/2024)    Readmission Risk Interventions    05/15/2024   11:43 AM 05/14/2024    3:09 PM  Readmission Risk Prevention Plan  Transportation Screening Complete Complete  Home Care Screening Complete Complete  Medication Review (RN CM) Complete Complete

## 2024-05-15 NOTE — Plan of Care (Incomplete)
   Problem: Education: Goal: Knowledge of General Education information will improve Description: Including pain rating scale, medication(s)/side effects and non-pharmacologic comfort measures Outcome: Progressing   Problem: Clinical Measurements: Goal: Ability to maintain clinical measurements within normal limits will improve Outcome: Progressing Goal: Diagnostic test results will improve Outcome: Progressing

## 2024-05-15 NOTE — Plan of Care (Signed)
   Problem: Education: Goal: Knowledge of General Education information will improve Description Including pain rating scale, medication(s)/side effects and non-pharmacologic comfort measures Outcome: Progressing   Problem: Health Behavior/Discharge Planning: Goal: Ability to manage health-related needs will improve Outcome: Progressing

## 2024-05-15 NOTE — Progress Notes (Signed)
 [facility] PROGRESS NOTE  Raven Lopez (DOB: [birth year]) FMW:[id number] PCP: Mancil Pfeiffer, PA-C Outpatient Specialists: Oncology, Dr. Davonna  Brief Narrative: Raven Lopez is a 68 y.o. female with a history of bladder CA s/p TURBT Feb 2024, declined cystectomy, s/p chemotherapy, XRT April-May 2024 lost to follow up, 1ppd tobacco use, anxiety, recent Dx L5 compression fracture who presented to the ED on 05/13/2024 with a couple days to a week of worsening severe lower back pain. She was found to have multiple metastatic lesions that have progressed including bony metastatic disease. Subsequently, she was admitted for intractable pain and debility. Oncology is consulted, opioid analgesics are being titrated, and rehabilitation is being pursued.   Subjective: No family at bedside, pt starting to eat lunch, says her pain is better, still taking IV dilaudid  very often, hasn't yet taken oxycodone . No new neurological complaints.   Objective: BP (!) 89/64 (BP Location: Left Arm)   Pulse 85   Temp 98.3 F (36.8 C) (Oral)   Resp 15   Ht 5' 2 (1.575 m)   Wt 38.1 kg   SpO2 96%   BMI 15.36 kg/m   Gen: Cachectic female in no distress Pulm: Clear, nonlabored  CV: RRR, no MRG, no edema GI: Soft, NT, ND, +BS Neuro: Alert and oriented. No new focal deficits. Ext: Warm, no deformities, sarcopenic Skin: No new rashes, lesions or ulcers on visualized skin   Assessment & Plan:  Intractable back pain due to vertebral metastatic disease and pathologic compression fractures, encounter for palliative care: Neuro exam reveals no deficits.  - Great difficulty swallowing, chronic issue. Much better control of pain with fentanyl  patch, oxycodone  solution, continued IV dilaudid . Will continue in effort at dose finding and to wean off IV medication to facilitate discharge. - D/w IR Re: kyphoplasty. They recommend referral for outpatient discussion of OsteoCool (RFA) +/- KP.  - Discussed the role  in palliative care services going forward to which the patient and family agree. Will place referral at discharge.  - Continue PT/OT, pt/family are amenable to rehabilitation facility placement prior to returning home to maximize functional mobility.   Metastatic bladder CA:  - Oncology consulted, appreciate their assessment. Will follow up 11/20 as planned. Pt contemplating another round of chemotherapy.   UTI, complicated:  - Continue abx. Pt has tolerated keflex (despite CTX allergy), which is much lower risk than levaquin so will trial keflex suspension now - Will plan an additional dose of diflucan  at end of treatment.  - Monitor culture and leukocytosis    Severe protein-calorie malnutrition, cachexia, hypoalbuminemia, generalized weakness, failure to thrive: BMI is 15, very deconditioned.  - PT/OT consulted, will pursue SNF rehabilitation - RD consulted. Pt unable to tolerate large pills (e.g. multivitamins), will push protein in the form of ensure high protein TID, magic cup TID.   Tobacco use:  - Nicotine patch, cessation counseling  AKI: SCr 1.05 on admission which actually represents significant renal impairment in light of her sarcopenia. Improving with IVF, will continue these for today with limited oral intake. - Continue IVF  Bernardino KATHEE Come, MD [facility] [email or web address] 05/15/2024, 1:38 PM

## 2024-05-16 DIAGNOSIS — Z515 Encounter for palliative care: Secondary | ICD-10-CM | POA: Diagnosis not present

## 2024-05-16 DIAGNOSIS — M545 Low back pain, unspecified: Secondary | ICD-10-CM | POA: Diagnosis not present

## 2024-05-16 MED ORDER — FLUCONAZOLE 100 MG PO TABS
100.0000 mg | ORAL_TABLET | Freq: Every day | ORAL | Status: DC
  Administered 2024-05-16: 100 mg via ORAL
  Filled 2024-05-16: qty 1

## 2024-05-16 NOTE — Progress Notes (Signed)
 [facility] PROGRESS NOTE  Raven Lopez (DOB: 07-11-1955) FMW:[id number] PCP: Mancil Pfeiffer, PA-C Outpatient Specialists: Oncology, Dr. Davonna  Brief Narrative: Raven Lopez is a 68 y.o. female with a history of bladder CA s/p TURBT Feb 2024, declined cystectomy, s/p chemotherapy, XRT April-May 2024 lost to follow up, 1ppd tobacco use, anxiety, recent Dx L5 compression fracture who presented to the ED on 05/13/2024 with a couple days to a week of worsening severe lower back pain. She was found to have multiple metastatic lesions that have progressed including bony metastatic disease. Subsequently, she was admitted for intractable pain and debility. Oncology is consulted, opioid analgesics are being titrated, and rehabilitation is being pursued.   Subjective: Pt resting quietly, happy with her improved pain control today. No new complaints. Says she ate a lot of breakfast.  Objective: BP 123/80 (BP Location: Left Arm)   Pulse 78   Temp 98.9 F (37.2 C)   Resp 16   Ht 5' 2 (1.575 m)   Wt 38.1 kg   SpO2 97%   BMI 15.36 kg/m   Gen: Cachectic, quite pleasant female in no distress Pulm: Clear, nonlabored  CV: RRR, no MRG GI: Soft, NT, ND, +BS  Neuro: Alert and oriented. No new focal deficits. Ext: Warm, no deformities, decreased muscle bulk Skin: No new rashes, lesions or ulcers on visualized skin   Assessment & Plan:  Intractable back pain due to vertebral metastatic disease and pathologic compression fractures, encounter for palliative care: Neuro exam reveals no deficits.  - Great difficulty swallowing, chronic issue. Much better control of pain with fentanyl  patch, oxycodone  solution, continued IV dilaudid . Has not required IV dilaudid  thus far today (continued using it frequently yesterday into the night though, will continue pushing po over IV. - D/w IR Re: kyphoplasty. They recommend referral for outpatient discussion of OsteoCool (RFA) +/- KP.  - Discussed the role  in palliative care services going forward to which the patient and family agree. Will place referral at discharge.  - Continue PT/OT, pt/family are amenable to rehabilitation facility placement prior to returning home to maximize functional mobility.   Metastatic bladder CA:  - Oncology consulted, appreciate their assessment. Will follow up 11/20 as planned. Pt contemplating another round of chemotherapy.   UTI, complicated:  - Continue abx. Pt has tolerated keflex (despite CTX allergy), which is much lower risk than levaquin so changed to keflex suspension  - Monitor culture and leukocytosis    Oral candidiasis:  - Continue diflucan   Severe protein-calorie malnutrition, cachexia, hypoalbuminemia, generalized weakness, failure to thrive: BMI is 15, very deconditioned.  - PT/OT consulted, will pursue SNF rehabilitation - RD consulted. Pt unable to tolerate large pills (e.g. multivitamins), will push protein in the form of ensure high protein TID, magic cup TID.   Tobacco use:  - Nicotine patch, cessation counseling  AKI: SCr 1.05 on admission which actually represents significant renal impairment in light of her sarcopenia. Improving with IVF, now taking better po, will stop IVF.  Bernardino KATHEE Come, MD [facility] [email or web address] 05/16/2024, 1:26 PM

## 2024-05-16 NOTE — Plan of Care (Signed)
   Problem: Education: Goal: Knowledge of General Education information will improve Description Including pain rating scale, medication(s)/side effects and non-pharmacologic comfort measures Outcome: Progressing   Problem: Health Behavior/Discharge Planning: Goal: Ability to manage health-related needs will improve Outcome: Progressing

## 2024-05-17 ENCOUNTER — Encounter (HOSPITAL_COMMUNITY): Payer: Self-pay | Admitting: Emergency Medicine

## 2024-05-17 ENCOUNTER — Encounter (HOSPITAL_COMMUNITY): Payer: Self-pay | Admitting: Internal Medicine

## 2024-05-17 ENCOUNTER — Observation Stay (HOSPITAL_COMMUNITY)
Admission: EM | Admit: 2024-05-17 | Discharge: 2024-05-20 | Disposition: A | Discharge status: Home / Self Care | Attending: Family Medicine | Admitting: Family Medicine

## 2024-05-17 ENCOUNTER — Other Ambulatory Visit: Payer: Self-pay

## 2024-05-17 DIAGNOSIS — E43 Unspecified severe protein-calorie malnutrition: Secondary | ICD-10-CM | POA: Insufficient documentation

## 2024-05-17 DIAGNOSIS — C7911 Secondary malignant neoplasm of bladder: Secondary | ICD-10-CM | POA: Insufficient documentation

## 2024-05-17 DIAGNOSIS — Z7901 Long term (current) use of anticoagulants: Secondary | ICD-10-CM | POA: Insufficient documentation

## 2024-05-17 DIAGNOSIS — G893 Neoplasm related pain (acute) (chronic): Secondary | ICD-10-CM | POA: Diagnosis not present

## 2024-05-17 DIAGNOSIS — I10 Essential (primary) hypertension: Secondary | ICD-10-CM | POA: Insufficient documentation

## 2024-05-17 DIAGNOSIS — F17210 Nicotine dependence, cigarettes, uncomplicated: Secondary | ICD-10-CM | POA: Insufficient documentation

## 2024-05-17 DIAGNOSIS — N390 Urinary tract infection, site not specified: Secondary | ICD-10-CM | POA: Diagnosis not present

## 2024-05-17 DIAGNOSIS — C670 Malignant neoplasm of trigone of bladder: Secondary | ICD-10-CM

## 2024-05-17 DIAGNOSIS — S32050A Wedge compression fracture of fifth lumbar vertebra, initial encounter for closed fracture: Secondary | ICD-10-CM | POA: Diagnosis not present

## 2024-05-17 DIAGNOSIS — B370 Candidal stomatitis: Secondary | ICD-10-CM | POA: Insufficient documentation

## 2024-05-17 DIAGNOSIS — R64 Cachexia: Secondary | ICD-10-CM

## 2024-05-17 DIAGNOSIS — Z79899 Other long term (current) drug therapy: Secondary | ICD-10-CM | POA: Diagnosis not present

## 2024-05-17 DIAGNOSIS — J449 Chronic obstructive pulmonary disease, unspecified: Secondary | ICD-10-CM | POA: Insufficient documentation

## 2024-05-17 DIAGNOSIS — C679 Malignant neoplasm of bladder, unspecified: Secondary | ICD-10-CM | POA: Insufficient documentation

## 2024-05-17 DIAGNOSIS — M549 Dorsalgia, unspecified: Principal | ICD-10-CM | POA: Insufficient documentation

## 2024-05-17 DIAGNOSIS — S32050G Wedge compression fracture of fifth lumbar vertebra, subsequent encounter for fracture with delayed healing: Secondary | ICD-10-CM

## 2024-05-17 DIAGNOSIS — R5381 Other malaise: Secondary | ICD-10-CM

## 2024-05-17 DIAGNOSIS — M8458XA Pathological fracture in neoplastic disease, other specified site, initial encounter for fracture: Secondary | ICD-10-CM | POA: Insufficient documentation

## 2024-05-17 DIAGNOSIS — I952 Hypotension due to drugs: Secondary | ICD-10-CM

## 2024-05-17 DIAGNOSIS — Z789 Other specified health status: Secondary | ICD-10-CM

## 2024-05-17 LAB — CBC
HCT: 35.5 % — ABNORMAL LOW (ref 36.0–46.0)
Hemoglobin: 11.2 g/dL — ABNORMAL LOW (ref 12.0–15.0)
MCH: 28 pg (ref 26.0–34.0)
MCHC: 31.5 g/dL (ref 30.0–36.0)
MCV: 88.8 fL (ref 80.0–100.0)
Platelets: 317 K/uL (ref 150–400)
RBC: 4 MIL/uL (ref 3.87–5.11)
RDW: 15.8 % — ABNORMAL HIGH (ref 11.5–15.5)
WBC: 14.7 K/uL — ABNORMAL HIGH (ref 4.0–10.5)
nRBC: 0 % (ref 0.0–0.2)

## 2024-05-17 LAB — BASIC METABOLIC PANEL WITH GFR
Anion gap: 11 (ref 5–15)
BUN: 17 mg/dL (ref 8–23)
CO2: 24 mmol/L (ref 22–32)
Calcium: 8.4 mg/dL — ABNORMAL LOW (ref 8.9–10.3)
Chloride: 99 mmol/L (ref 98–111)
Creatinine, Ser: 1.03 mg/dL — ABNORMAL HIGH (ref 0.44–1.00)
GFR, Estimated: 58 mL/min — ABNORMAL LOW (ref >60)
Glucose, Bld: 122 mg/dL — ABNORMAL HIGH (ref 70–99)
Potassium: 4.5 mmol/L (ref 3.5–5.1)
Sodium: 134 mmol/L — ABNORMAL LOW (ref 135–145)

## 2024-05-17 MED ORDER — CEPHALEXIN 250 MG/5ML PO SUSR: 500.0000 mg | Freq: Two times a day (BID) | ORAL | Status: DC

## 2024-05-17 MED ORDER — NYSTATIN 100000 UNIT/ML MT SUSP: 5.0000 mL | Freq: Four times a day (QID) | OROMUCOSAL | Status: AC

## 2024-05-17 MED ORDER — DIAZEPAM 1 MG/ML PO SOLN
5.0000 mg | Freq: Three times a day (TID) | ORAL | Status: DC | PRN
Start: 2024-05-17 — End: 2024-05-20

## 2024-05-17 MED ORDER — POLYETHYLENE GLYCOL 3350 17 G PO PACK: 17.0000 g | PACK | Freq: Every day | ORAL | Status: DC | PRN

## 2024-05-17 MED ORDER — ACETAMINOPHEN 650 MG RE SUPP
650.0000 mg | Freq: Four times a day (QID) | RECTAL | Status: DC | PRN
Start: 2024-05-17 — End: 2024-05-20

## 2024-05-17 MED ORDER — CEPHALEXIN 250 MG/5ML PO SUSR
500.0000 mg | Freq: Two times a day (BID) | ORAL | Status: DC
Start: 2024-05-17 — End: 2024-05-19
  Administered 2024-05-17 – 2024-05-18 (×3): 500 mg via ORAL
  Filled 2024-05-17 (×4): qty 10

## 2024-05-17 MED ORDER — FLUCONAZOLE 100 MG PO TABS
100.0000 mg | ORAL_TABLET | Freq: Every day | ORAL | Status: DC
  Administered 2024-05-18 – 2024-05-19 (×2): 100 mg via ORAL
  Filled 2024-05-17: qty 1

## 2024-05-17 MED ORDER — OXYCODONE HCL 5 MG/5ML PO SOLN
5.0000 mg | Freq: Once | ORAL | Status: AC
  Administered 2024-05-17: 5 mg via ORAL
  Filled 2024-05-17: qty 5

## 2024-05-17 MED ORDER — ENSURE PLUS HIGH PROTEIN PO LIQD: 237.0000 mL | Freq: Two times a day (BID) | ORAL | Status: DC

## 2024-05-17 MED ORDER — ONDANSETRON HCL 4 MG/2ML IJ SOLN
4.0000 mg | Freq: Four times a day (QID) | INTRAMUSCULAR | Status: DC | PRN
Start: 2024-05-17 — End: 2024-05-20
  Filled 2024-05-17: qty 2

## 2024-05-17 MED ORDER — OXYCODONE HCL 5 MG PO TABS: 5.0000 mg | ORAL_TABLET | ORAL | Status: DC | PRN

## 2024-05-17 MED ORDER — OXYCODONE HCL 5 MG/5ML PO SOLN: 5.0000 mg | ORAL | 0 refills | Status: DC | PRN

## 2024-05-17 MED ORDER — DIAZEPAM 1 MG/ML PO SOLN: 5.0000 mg | Freq: Three times a day (TID) | ORAL | 0 refills | Status: DC | PRN

## 2024-05-17 MED ORDER — ONDANSETRON HCL 4 MG PO TABS
4.0000 mg | ORAL_TABLET | Freq: Four times a day (QID) | ORAL | Status: DC | PRN
  Administered 2024-05-18 – 2024-05-19 (×2): 4 mg via ORAL
  Filled 2024-05-17 (×2): qty 1

## 2024-05-17 MED ORDER — OXYCODONE HCL 5 MG/5ML PO SOLN
5.0000 mg | ORAL | Status: DC | PRN
  Filled 2024-05-17: qty 5

## 2024-05-17 MED ORDER — POLYETHYLENE GLYCOL 3350 17 G PO PACK
17.0000 g | PACK | Freq: Every day | ORAL | Status: DC | PRN
  Administered 2024-05-18 – 2024-05-19 (×2): 17 g via ORAL
  Filled 2024-05-17 (×2): qty 1

## 2024-05-17 MED ORDER — ACETAMINOPHEN 325 MG PO TABS
650.0000 mg | ORAL_TABLET | Freq: Four times a day (QID) | ORAL | Status: DC | PRN
Start: 2024-05-17 — End: 2024-05-20

## 2024-05-17 MED ORDER — FENTANYL 12 MCG/HR TD PT72: 1.0000 | MEDICATED_PATCH | TRANSDERMAL | 0 refills | Status: DC

## 2024-05-17 MED ORDER — OXYCODONE HCL 5 MG PO TABS
5.0000 mg | ORAL_TABLET | ORAL | Status: DC | PRN
  Administered 2024-05-17 – 2024-05-19 (×9): 5 mg via ORAL
  Filled 2024-05-17 (×9): qty 1

## 2024-05-17 MED ORDER — ENOXAPARIN SODIUM 30 MG/0.3ML IJ SOSY
30.0000 mg | PREFILLED_SYRINGE | INTRAMUSCULAR | Status: DC
  Administered 2024-05-17 – 2024-05-19 (×3): 30 mg via SUBCUTANEOUS
  Filled 2024-05-17 (×3): qty 0.3

## 2024-05-17 MED ORDER — NICOTINE 14 MG/24HR TD PT24: 14.0000 mg | MEDICATED_PATCH | Freq: Every day | TRANSDERMAL | Status: DC

## 2024-05-17 MED ORDER — FLUCONAZOLE 100 MG PO TABS: 100.0000 mg | ORAL_TABLET | Freq: Every day | ORAL | Status: DC

## 2024-05-17 MED ORDER — NICOTINE 14 MG/24HR TD PT24
14.0000 mg | MEDICATED_PATCH | Freq: Every day | TRANSDERMAL | Status: DC
  Administered 2024-05-18 – 2024-05-19 (×2): 14 mg via TRANSDERMAL
  Filled 2024-05-17 (×2): qty 1

## 2024-05-17 NOTE — ED Triage Notes (Signed)
 Pt was discharged from upstairs today to nursing home. When pt got to nursing home she realized she would not be comfortable staying there and would like to go home. Pt is concerned about pain management.

## 2024-05-17 NOTE — Progress Notes (Signed)
 Palliative-   Chart reviewed.  Patient is discharging.  TOC order placed for referral to outpatient Palliative.   Cassondra Stain, AGNP-C Palliative Medicine  No charge

## 2024-05-17 NOTE — Plan of Care (Signed)
  Problem: Education: Goal: Knowledge of General Education information will improve Description: Including pain rating scale, medication(s)/side effects and non-pharmacologic comfort measures Outcome: Adequate for Discharge   Problem: Health Behavior/Discharge Planning: Goal: Ability to manage health-related needs will improve Outcome: Adequate for Discharge   Problem: Clinical Measurements: Goal: Ability to maintain clinical measurements within normal limits will improve Outcome: Adequate for Discharge Goal: Will remain free from infection Outcome: Adequate for Discharge Goal: Diagnostic test results will improve Outcome: Adequate for Discharge Goal: Respiratory complications will improve Outcome: Adequate for Discharge Goal: Cardiovascular complication will be avoided Outcome: Adequate for Discharge   Problem: Activity: Goal: Risk for activity intolerance will decrease Outcome: Adequate for Discharge   Problem: Nutrition: Goal: Adequate nutrition will be maintained Outcome: Adequate for Discharge   Problem: Coping: Goal: Level of anxiety will decrease Outcome: Adequate for Discharge   Problem: Elimination: Goal: Will not experience complications related to bowel motility Outcome: Adequate for Discharge Goal: Will not experience complications related to urinary retention Outcome: Adequate for Discharge   Problem: Pain Managment: Goal: General experience of comfort will improve and/or be controlled Outcome: Adequate for Discharge   Problem: Safety: Goal: Ability to remain free from injury will improve Outcome: Adequate for Discharge   Problem: Skin Integrity: Goal: Risk for impaired skin integrity will decrease Outcome: Adequate for Discharge   Problem: Increased Nutrient Needs (NI-5.1) Goal: Food and/or nutrient delivery Description: Individualized approach for food/nutrient provision. Outcome: Adequate for Discharge   Problem: Acute Rehab PT Goals(only PT  should resolve) Goal: Pt Will Go Supine/Side To Sit Outcome: Adequate for Discharge Goal: Patient Will Transfer Sit To/From Stand Outcome: Adequate for Discharge Goal: Pt Will Transfer Bed To Chair/Chair To Bed Outcome: Adequate for Discharge Goal: Pt Will Ambulate Outcome: Adequate for Discharge

## 2024-05-17 NOTE — ED Provider Notes (Signed)
 [facility] MEDICAL SURGICAL UNIT Provider Note   CSN: [id number] Arrival date & time: 05/17/24  1717     Patient presents with: Back Pain   Raven Lopez is a 68 y.o. female.   HPI    68 y.o. female with a history of bladder CA s/p TURBT Feb 2024, declined cystectomy, s/p chemotherapy, XRT April-May 2024 lost to follow up, 1ppd tobacco use, anxiety, recent Dx L5 compression fracture who presented to the ED with chief complaint of pain.  Patient was actually just discharged earlier today with outpatient palliative consultation.  She had gone to the rehab facility, where she heard a lot of loud noise.  She has PTSD with her husband passing away who had mental illness.  She started going into panic attack.  Thereafter she also noted that the bed was very uncomfortable with her compression fracture she was in a lot of pain laying on it.  She requested to be brought back to the emergency room.  Family indicates that they were expecting a call from palliative today to set up outpatient management, but that has not occurred.  They would like patient to be admitted to the hospital so that palliative can help patient get home -and now they are likely going to prefer that patient be discharged home with appropriate home assistance.  Prior to Admission medications   Medication Sig Start Date End Date Taking? Authorizing Provider  acetaminophen  (TYLENOL ) 500 MG tablet Take 1,000 mg by mouth every 6 (six) hours as needed for mild pain (pain score 1-3) or moderate pain (pain score 4-6).   Yes [provider]  cephALEXin (KEFLEX) 250 MG/5ML suspension Take 10 mLs (500 mg total) by mouth 2 (two) times daily for 5 days. 05/17/24 05/22/24 Yes Bryn Bernardino NOVAK, MD  diazepam  (VALIUM ) 1 MG/ML solution Take 5 mLs (5 mg total) by mouth every 8 (eight) hours as needed for anxiety. 05/17/24  Yes Bryn Bernardino NOVAK, MD  fentaNYL  (DURAGESIC ) 12 MCG/HR Place 1 patch onto the skin every 3 (three) days. 05/18/24   Yes Bryn Bernardino NOVAK, MD  fluconazole  (DIFLUCAN ) 100 MG tablet Take 1 tablet (100 mg total) by mouth at bedtime. 05/17/24  Yes Bryn Bernardino NOVAK, MD  ibuprofen (ADVIL) 200 MG tablet Take 200 mg by mouth every 6 (six) hours as needed for mild pain (pain score 1-3).   Yes [provider]  lidocaine  (LIDODERM ) 5 % Place 1 patch onto the skin daily. Remove & Discard patch within 12 hours or as directed by MD 04/16/24  Yes Francesca Elsie CROME, MD  losartan (COZAAR) 25 MG tablet Take 25 mg by mouth every evening. 12/14/21  Yes [provider]  nicotine (NICODERM CQ - DOSED IN MG/24 HOURS) 14 mg/24hr patch Place 1 patch (14 mg total) onto the skin daily. 05/17/24  Yes Bryn Bernardino NOVAK, MD  nystatin (MYCOSTATIN) 100000 UNIT/ML suspension Take 5 mLs (500,000 Units total) by mouth 4 (four) times daily for 5 days. 05/17/24 05/22/24 Yes Bryn Bernardino NOVAK, MD  oxyCODONE  (OXY IR/ROXICODONE ) 5 MG immediate release tablet Take 5 mg by mouth every 4 (four) hours as needed for severe pain (pain score 7-10).   Yes [provider]  oxyCODONE  (ROXICODONE ) 5 MG/5ML solution Take 5-10 mLs (5-10 mg total) by mouth every 4 (four) hours as needed for moderate pain (pain score 4-6) or severe pain (pain score 7-10). 05/17/24  Yes Bryn Bernardino NOVAK, MD  polyethylene glycol (MIRALAX / GLYCOLAX) 17 g packet Take 17  g by mouth daily as needed for mild constipation. 05/17/24  Yes Bryn Bernardino NOVAK, MD    Allergies: Penicillins, Aspirin, Demerol [meperidine hcl], and Sulfonamide derivatives    Review of Systems  All other systems reviewed and are negative.   Updated Vital Signs BP (!) 131/101   Pulse 95   Temp 98.6 F (37 C) (Oral)   Resp 18   Ht 5' 2 (1.575 m)   Wt 38.1 kg   SpO2 97%   BMI 15.36 kg/m   Physical Exam Vitals and nursing note reviewed.  Constitutional:      Appearance: She is well-developed.  HENT:     Head: Atraumatic.  Cardiovascular:     Rate and Rhythm: Normal rate.  Pulmonary:      Effort: Pulmonary effort is normal.  Musculoskeletal:     Cervical back: Normal range of motion.  Skin:    General: Skin is warm and dry.  Neurological:     Mental Status: She is alert and oriented to person, place, and time.     (all labs ordered are listed, but only abnormal results are displayed) Labs Reviewed  CBC - Abnormal; Notable for the following components:      Result Value   WBC 14.7 (*)    Hemoglobin 11.2 (*)    HCT 35.5 (*)    RDW 15.8 (*)    All other components within normal limits  BASIC METABOLIC PANEL WITH GFR - Abnormal; Notable for the following components:   Sodium 134 (*)    Glucose, Bld 122 (*)    Creatinine, Ser 1.03 (*)    Calcium 8.4 (*)    GFR, Estimated 58 (*)    All other components within normal limits    EKG: None  Radiology: No results found.   Procedures   Medications Ordered in the ED  enoxaparin (LOVENOX) injection 30 mg (30 mg Subcutaneous Given 05/17/24 2304)  acetaminophen  (TYLENOL ) tablet 650 mg (has no administration in time range)    Or  acetaminophen  (TYLENOL ) suppository 650 mg (has no administration in time range)  ondansetron  (ZOFRAN ) tablet 4 mg (has no administration in time range)    Or  ondansetron  (ZOFRAN ) injection 4 mg (has no administration in time range)  polyethylene glycol (MIRALAX / GLYCOLAX) packet 17 g (has no administration in time range)  feeding supplement (ENSURE PLUS HIGH PROTEIN) liquid 237 mL (has no administration in time range)  oxyCODONE  (Oxy IR/ROXICODONE ) immediate release tablet 5 mg (5 mg Oral Given 05/17/24 2303)  cephALEXin (KEFLEX) 250 MG/5ML suspension 500 mg (500 mg Oral Given 05/17/24 2304)  fluconazole  (DIFLUCAN ) tablet 100 mg (has no administration in time range)  diazepam  (VALIUM ) 1 MG/ML solution 5 mg (has no administration in time range)  nicotine (NICODERM CQ - dosed in mg/24 hours) patch 14 mg (has no administration in time range)  oxyCODONE  (ROXICODONE ) 5 MG/5ML solution 5 mg (5  mg Oral Given 05/17/24 1814)                                    Medical Decision Making Risk Prescription drug management. Decision regarding hospitalization.   68 y.o. female with a history of bladder CA s/p TURBT Feb 2024, declined cystectomy, s/p chemotherapy, XRT April-May 2024 lost to follow up, 1ppd tobacco use, anxiety, recent Dx L5 compression fracture presents to the ER for pain control.  Family is now keen on trying to  get patient actually home with outpatient palliative assisting in medication management.  Will give oral pain medicine now.  There is no medical workup indicated at this time.  Palliative consulted.  Medicine willing to admit.  Final diagnoses:  Cancer-related pain    ED Discharge Orders     None          Charlyn Sora, MD 05/17/24 2335

## 2024-05-17 NOTE — H&P (Signed)
 History and Physical    Raven Lopez FMW:[id number] DOB: 1956/05/21 DOA: 05/17/2024  PCP: Mancil Pfeiffer, PA-C   Patient coming from: Home  I have personally briefly reviewed patient's old medical records in [facility]  Chief Complaint: Uncontrolled back pain  HPI: Raven Lopez is a 68 y.o. female with medical history significant for metastatic bladder cancer, back pain.  Patient's was just discharged from the hospital, she was hospitalized 1/6 to 11/10 for intractable back pain, urinary tract infection.  She was discharged to nursing home.  On getting there, patient reported she could not stay at the nursing home, noise was too much, the bed was too hard, patient also has PTSD from husband staying in the nursing home, and she was still having back pain.  Patient wanted to come back to the hospital or go home.  But per sister and son at bedside, the house is not ready, patient's buttocks are raw and the will likely need a change in bed, and they are unable to care for patient at this time.  She was ambulating up until 2 weeks ago.   Patient on my evaluation still with back pain, but has no other complaints.  ED Course: Stable vitals.  Hospitalist called to re-admit patient.  Review of Systems: As per HPI all other systems reviewed and negative.  Past Medical History:  Diagnosis Date   AC (acromioclavicular) joint bone spurs    Anxiety    Asthma    COPD (chronic obstructive pulmonary disease) (HCC)    DDD (degenerative disc disease), lumbar    Hypertension    Neck fracture (HCC)    Panic disorder    Scoliosis     Past Surgical History:  Procedure Laterality Date   BLADDER INSTILLATION N/A 08/29/2022   Procedure: BLADDER INSTILLATION;  Surgeon: Sherrilee Belvie CROME, MD;  Location: [facility];  Service: Urology;  Laterality: N/A;   CESAREAN SECTION     CYSTOSCOPY W/ RETROGRADES Bilateral 08/29/2022   Procedure: CYSTOSCOPY WITH RETROGRADE PYELOGRAM;  Surgeon: Sherrilee Belvie CROME, MD;  Location: [facility];  Service: Urology;  Laterality: Bilateral;   IR IMAGING GUIDED PORT INSERTION  10/17/2022   TRANSURETHRAL RESECTION OF BLADDER TUMOR N/A 08/29/2022   Procedure: TRANSURETHRAL RESECTION OF BLADDER TUMOR (TURBT);  Surgeon: Sherrilee Belvie CROME, MD;  Location: [facility];  Service: Urology;  Laterality: N/A;   wisdom teeth removal       reports that she has been smoking cigarettes. She has a 40 pack-year smoking history. She has never used smokeless tobacco. She reports that she does not currently use alcohol. She reports that she does not currently use drugs.  Allergies  Allergen Reactions   Penicillins Shortness Of Breath and Swelling    Immediate rash, facial/tongue/throat swelling, SOB or lightheadedness with hypotension Pt says she can take keflex    Aspirin Other (See Comments)    Dizzy, weak, and upset stomach   Demerol [Meperidine Hcl] Other (See Comments)    Altered mental status - anger, hallucinations   Sulfonamide Derivatives Nausea And Vomiting    Family History  Problem Relation Age of Onset   Aneurysm Father    Kidney failure Mother    Hypertension Sister    Thyroid  disease Sister    Anxiety disorder Sister    Colitis Sister    Irritable bowel syndrome Sister    Prior to Admission medications   Medication Sig Start Date End Date Taking? Authorizing Provider  acetaminophen  (TYLENOL ) 500 MG tablet  Take 1,000 mg by mouth every 6 (six) hours as needed for mild pain (pain score 1-3) or moderate pain (pain score 4-6).    [provider]  cephALEXin (KEFLEX) 250 MG/5ML suspension Take 10 mLs (500 mg total) by mouth 2 (two) times daily for 5 days. 05/17/24 05/22/24  Bryn Bernardino NOVAK, MD  diazepam  (VALIUM ) 1 MG/ML solution Take 5 mLs (5 mg total) by mouth every 8 (eight) hours as needed for anxiety. 05/17/24   Bryn Bernardino NOVAK, MD  fentaNYL  (DURAGESIC ) 12 MCG/HR Place 1 patch onto the skin every 3 (three) days. 05/18/24   Bryn Bernardino NOVAK, MD  fluconazole   (DIFLUCAN ) 100 MG tablet Take 1 tablet (100 mg total) by mouth at bedtime. 05/17/24   Bryn Bernardino NOVAK, MD  ibuprofen (ADVIL) 200 MG tablet Take 200 mg by mouth every 6 (six) hours as needed for mild pain (pain score 1-3).    [provider]  lidocaine  (LIDODERM ) 5 % Place 1 patch onto the skin daily. Remove & Discard patch within 12 hours or as directed by MD 04/16/24   Francesca Elsie CROME, MD  losartan (COZAAR) 25 MG tablet Take 25 mg by mouth every evening. 12/14/21   [provider]  nicotine (NICODERM CQ - DOSED IN MG/24 HOURS) 14 mg/24hr patch Place 1 patch (14 mg total) onto the skin daily. 05/17/24   Bryn Bernardino NOVAK, MD  nystatin (MYCOSTATIN) 100000 UNIT/ML suspension Take 5 mLs (500,000 Units total) by mouth 4 (four) times daily for 5 days. 05/17/24 05/22/24  Bryn Bernardino NOVAK, MD  oxyCODONE  (ROXICODONE ) 5 MG/5ML solution Take 5-10 mLs (5-10 mg total) by mouth every 4 (four) hours as needed for moderate pain (pain score 4-6) or severe pain (pain score 7-10). 05/17/24   Bryn Bernardino NOVAK, MD  polyethylene glycol (MIRALAX / GLYCOLAX) 17 g packet Take 17 g by mouth daily as needed for mild constipation. 05/17/24   Bryn Bernardino NOVAK, MD    Physical Exam: Vitals:   05/17/24 1722 05/17/24 1821 05/17/24 1830  BP:  116/75 106/74  Pulse:  95 91  Resp:  16 18  SpO2:  96% 96%  Weight: 38.1 kg    Height: 5' 2 (1.575 m)      Constitutional: Chronically ill-appearing, calm, comfortable, eating burgers and fries. Vitals:   05/17/24 1722 05/17/24 1821 05/17/24 1830  BP:  116/75 106/74  Pulse:  95 91  Resp:  16 18  SpO2:  96% 96%  Weight: 38.1 kg    Height: 5' 2 (1.575 m)     Eyes: PERRL, lids and conjunctivae normal ENMT: Mucous membranes are moist.   Neck: normal, supple, no masses, no thyromegaly Respiratory: clear to auscultation bilaterally, no wheezing, no crackles. Normal respiratory effort. No accessory muscle use.  Cardiovascular: Regular rate and rhythm, no murmurs / rubs /  gallops. No extremity edema. Abdomen: no tenderness, no masses palpated. No hepatosplenomegaly.   Musculoskeletal: no clubbing / cyanosis. No joint deformity upper and lower extremities. Skin: no rashes, lesions, ulcers. No induration Neurologic: No facial asymmetry, moves extremities spontaneously, speech fluent.  Psychiatric: Normal judgment and insight. Alert and oriented x 3. Normal mood.   Labs on Admission: I have personally reviewed following labs and imaging studies  CBC: Recent Labs  Lab 05/13/24 1628 05/14/24 0348 05/15/24 0431  WBC 15.3* 14.6* 15.2*  NEUTROABS 13.7* 11.7* 12.5*  HGB 10.5* 9.4* 9.4*  HCT 32.7* 30.1* 29.8*  MCV 87.7 88.5 87.9  PLT 366 346 353  Basic Metabolic Panel: Recent Labs  Lab 05/13/24 1604 05/14/24 0348 05/15/24 0431  NA 134* 133* 135  K 5.1 4.4 4.7  CL 99 101 101  CO2 24 22 25   GLUCOSE 93 98 108*  BUN 20 17 14   CREATININE 1.05* 0.90 0.88  CALCIUM 8.5* 8.0* 8.3*  MG  --  2.0  --   PHOS  --  3.1  --    GFR: Estimated Creatinine Clearance: 35.8 mL/min (by C-G formula based on SCr of 0.88 mg/dL). Liver Function Tests: Recent Labs  Lab 05/13/24 1604 05/14/24 0348 05/15/24 0431  AST 43* 40 38  ALT 23 21 20   ALKPHOS 297* 297* 317*  BILITOT 1.2 0.8 0.8  PROT 6.5 5.8* 5.8*  ALBUMIN 3.0* 2.7* 2.7*   Urine analysis:    Component Value Date/Time   COLORURINE YELLOW 05/14/2024 0000   APPEARANCEUR CLEAR 05/14/2024 0000   APPEARANCEUR Clear 03/17/2024 1455   LABSPEC >1.046 (H) 05/14/2024 0000   PHURINE 6.0 05/14/2024 0000   GLUCOSEU NEGATIVE 05/14/2024 0000   HGBUR NEGATIVE 05/14/2024 0000   BILIRUBINUR NEGATIVE 05/14/2024 0000   BILIRUBINUR Negative 03/17/2024 1455   KETONESUR 5 (A) 05/14/2024 0000   PROTEINUR NEGATIVE 05/14/2024 0000   UROBILINOGEN 0.2 02/12/2023 1235   UROBILINOGEN 0.2 04/05/2010 0558   NITRITE NEGATIVE 05/14/2024 0000   LEUKOCYTESUR NEGATIVE 05/14/2024 0000    Radiological Exams on Admission: No  results found.  EKG: none   Assessment/Plan Principal Problem:   Intractable back pain Active Problems:   Malignant neoplasm of urinary bladder (HCC)   Compression fracture of fifth lumbar vertebra (HCC)  Assessment and Plan:  Intractable back pain- Intractable back pain due to vertebral metastatic disease and pathologic compression fractures, no focal neurologic deficits.  Family requesting previous oral oxycodone  p.o. be resumed and no longer want oxycodone  solution.   - Family refusing SNF, patient deconditioned, unable to ambulate, multiple complaints, requesting palliative care services, patient's home is not ready for patient, they are unable to care for patient at this time - Per discharge summary by Dr. Bryn - D/w IR Re: kyphoplasty. They recommend referral for outpatient discussion of OsteoCool (RFA) +/- KP. This has been done, and the patient will be contacted to schedule this appointment.  - Consult palliative care in a.m. - PT eval - CBC, BMP  Uncomplicated UTI complete Keflex  Metastatic bladder CA:  - Oncology consulted, appreciate their assessment. Will follow up 11/20 as planned. Pt contemplating another round of chemotherapy.   Oral candidiasis - Resume Diflucan , nystatin  Severe protein-calorie malnutrition, cachexia, hypoalbuminemia, generalized weakness, failure to thrive: BMI is 15, very deconditioned.  -Ensure   DVT prophylaxis: Lovenox Code Status: Full code, confirmed with patient and family at bedside Family Communication: Sister and son at bedside Disposition Plan: ~ 1-2 days Consults called: Palliative care Admission status: Observation, MedSurg   Author: Tully FORBES Carwin, MD 05/17/2024 9:28 PM  For on call review [email or web address].

## 2024-05-17 NOTE — Plan of Care (Signed)

## 2024-05-17 NOTE — Care Management Important Message (Signed)
 Important Message  Patient Details  Name: Raven Lopez MRN: [id number] Date of Birth: 15-Feb-1956   Important Message Given:  Yes - Medicare IM     [name] L [name] 05/17/2024, 11:21 AM

## 2024-05-17 NOTE — ED Notes (Signed)
 Writer called receiving unit to see if RN had any questions about patient. Receiving RN stated they had not questions. Writer informed that patient would be coming to unit shortly.

## 2024-05-17 NOTE — Discharge Summary (Signed)
 Physician Discharge Summary   Patient: Raven Lopez MRN: [id number] DOB: Jun 08, 1956  Admit date:     05/13/2024  Discharge date: 05/17/24  Discharge Physician: Bernardino KATHEE Come   PCP: Mancil Pfeiffer, [place]   Recommendations at discharge:  Follow up with interventional radiology as an outpatient for OsteoCool. See details below, anticipate scheduling this this week.  Continue pain management with liquid oxycodone  prn and fentanyl  patch (last applied around 5pm on 11/8, will be due to be replaced 72 hours after that). Ok to continue low dose ibuprofen and tylenol  prn as well. Also continue lidocaine  patch.  Follow up with oncology as scheduled on 11/20 to discuss reinitiation of chemotherapy.  Continue keflex for 5 more days for UTI and diflucan  and nystatin for 5 more days for yeast infection as well.   Discharge Diagnoses: Principal Problem:   Back pain Active Problems:   Encounter for palliative care  Hospital Course: [name] is a 68 y.o. female with a history of bladder CA s/p TURBT Feb 2024, declined cystectomy, s/p chemotherapy, XRT April-May 2024 lost to follow up, 1ppd tobacco use, anxiety, recent Dx L5 compression fracture who presented to the ED on 05/13/2024 with a couple days to a week of worsening severe lower back pain. She was found to have multiple metastatic lesions that have progressed including bony metastatic disease. Subsequently, she was admitted for intractable pain and debility. Oncology was consulted, discussed with the patient and family who will follow up on 11/20 as an outpatient. Opioid analgesics have been titrated with good effect, and rehabilitation is being pursued. Please see details below.   Assessment and Plan: Intractable back pain due to vertebral metastatic disease and pathologic compression fractures, encounter for palliative care: Neuro exam reveals no deficits.  - Great difficulty swallowing, chronic issue. Much better control of pain with fentanyl   patch, oxycodone  solution.   - D/w IR Re: kyphoplasty. They recommend referral for outpatient discussion of OsteoCool (RFA) +/- KP. This has been done, and the patient will be contacted to schedule this appointment.  - Discussed the role in palliative care services going forward to which the patient and family agree. Will defer to oncology visit at 11/20 to consider palliative care following after discharge.  - Continue PT/OT, pt/family are amenable to rehabilitation facility placement prior to returning home to maximize functional mobility.    Metastatic bladder CA:  - Oncology consulted, appreciate their assessment. Will follow up 11/20 as planned. Pt contemplating another round of chemotherapy.    UTI, complicated:  - Continue abx with keflex (tolerating despite CTX allergy). No culture sent, pt afebrile with improved symptoms.    Oral candidiasis:  - Continue diflucan , nystatin   Severe protein-calorie malnutrition, cachexia, hypoalbuminemia, generalized weakness, failure to thrive: BMI is 15, very deconditioned.  - PT/OT consulted, will pursue SNF rehabilitation - RD consulted. Pt unable to tolerate large pills (e.g. multivitamins), will push protein in the form of ensure high protein TID, magic cup TID.    Tobacco use:  - Nicotine patch continued at discharge, cessation counseling   AKI: SCr 1.05 on admission, improved to 0.88.   Consultants: Oncology, IR, palliative care Procedures performed: None  Disposition: Skilled nursing facility Diet recommendation: Unrestricted DISCHARGE MEDICATION: Allergies as of 05/17/2024       Reactions   Penicillins Shortness Of Breath, Swelling   Immediate rash, facial/tongue/throat swelling, SOB or lightheadedness with hypotension Pt says she can take keflex   Aspirin Other (See Comments)   Dizzy,  weak, and upset stomach   Demerol [meperidine Hcl] Other (See Comments)   Altered mental status - anger, hallucinations   Sulfonamide  Derivatives Nausea And Vomiting        Medication List     STOP taking these medications    dextrose  5 % SOLN 1,000 mL with fluorouracil  5 GM/100ML SOLN   diazepam  10 MG tablet Commonly known as: VALIUM  Replaced by: diazepam  1 MG/ML solution   oxyCODONE  5 MG immediate release tablet Commonly known as: Oxy IR/ROXICODONE  Replaced by: oxyCODONE  5 MG/5ML solution       TAKE these medications    acetaminophen  500 MG tablet Commonly known as: TYLENOL  Take 1,000 mg by mouth every 6 (six) hours as needed for mild pain (pain score 1-3) or moderate pain (pain score 4-6).   cephALEXin 250 MG/5ML suspension Commonly known as: KEFLEX Take 10 mLs (500 mg total) by mouth 2 (two) times daily for 5 days.   diazepam  1 MG/ML solution Commonly known as: VALIUM  Take 5 mLs (5 mg total) by mouth every 8 (eight) hours as needed for anxiety. Replaces: diazepam  10 MG tablet   fentaNYL  12 MCG/HR Commonly known as: DURAGESIC  Place 1 patch onto the skin every 3 (three) days. Start taking on: May 18, 2024   fluconazole  100 MG tablet Commonly known as: DIFLUCAN  Take 1 tablet (100 mg total) by mouth at bedtime. What changed:  medication strength how much to take when to take this   ibuprofen 200 MG tablet Commonly known as: ADVIL Take 200 mg by mouth every 6 (six) hours as needed for mild pain (pain score 1-3).   lidocaine  5 % Commonly known as: Lidoderm  Place 1 patch onto the skin daily. Remove & Discard patch within 12 hours or as directed by MD   losartan 25 MG tablet Commonly known as: COZAAR Take 25 mg by mouth every evening.   nicotine 14 mg/24hr patch Commonly known as: NICODERM CQ - dosed in mg/24 hours Place 1 patch (14 mg total) onto the skin daily.   nystatin 100000 UNIT/ML suspension Commonly known as: MYCOSTATIN Take 5 mLs (500,000 Units total) by mouth 4 (four) times daily for 5 days.   oxyCODONE  5 MG/5ML solution Commonly known as: ROXICODONE  Take 5-10 mLs  (5-10 mg total) by mouth every 4 (four) hours as needed for moderate pain (pain score 4-6) or severe pain (pain score 7-10). Replaces: oxyCODONE  5 MG immediate release tablet   polyethylene glycol 17 g packet Commonly known as: MIRALAX / GLYCOLAX Take 17 g by mouth daily as needed for mild constipation.        Follow-up Information     [facility] Follow up.   Specialty: Radiology Why: IR schedulers will call you to discuss a date/time for you to come in and discuss a possible procedure for your spine (Osteocool). Please call with questions or concerns prior to your appointment or if you do not hear from us  within 7 days of discharge. Contact information: [address] [place]  [address] [phone]        [place], [place], [place] Follow up.   Specialty: Physician Assistant Contact information: [address] [place] [address] [phone]                Discharge Exam: Fredricka Weights   05/13/24 1900  Weight: 38.1 kg  Cachectic female in no distress Clear, nonlabored Systolic murmur stable, chronic. No edema  Condition at discharge: stable  The results  of significant diagnostics from this hospitalization (including imaging, microbiology, ancillary and laboratory) are listed below for reference.   Imaging Studies: CT CHEST ABDOMEN PELVIS W CONTRAST Result Date: 05/13/2024 CLINICAL DATA:  History of bladder cancer. Metastatic disease evaluation. EXAM: CT CHEST, ABDOMEN, AND PELVIS WITH CONTRAST TECHNIQUE: Multidetector CT imaging of the chest, abdomen and pelvis was performed following the standard protocol during bolus administration of intravenous contrast. RADIATION DOSE REDUCTION: This exam was performed according to the departmental dose-optimization program which includes automated exposure control, adjustment of the mA and/or kV according to patient size and/or use of iterative reconstruction technique. CONTRAST:   OMNIPAQUE  IOHEXOL  300 MG/ML  SOLN COMPARISON:  CT dated 06/23/2023. FINDINGS: CT CHEST FINDINGS Cardiovascular: There is no cardiomegaly or pericardial effusion. There is coronary vascular calcification and calcification of the mitral annulus. Right-sided Port-A-Cath with tip in the right atrium. Moderate atherosclerotic calcification of the thoracic aorta. No aneurysmal dilatation or dissection. The central pulmonary arteries are patent. Mediastinum/Nodes: No hilar adenopathy. Subcarinal adenopathy measures 2.2 cm in short axis new since the prior CT. The esophagus is grossly unremarkable. No mediastinal fluid collection. Lungs/Pleura: A 4 mm nodule in the left lower lobe (120/7) and new since the prior CT. A cluster of nodular density in the anterior left lung base (114/7) may be inflammatory or infectious in etiology. A 6 mm left upper lobe nodule (45/7) present on the prior CT. No consolidative changes. Trace right pleural effusion. No pneumothorax. The central airways are patent. Musculoskeletal: Osteopenia with degenerative changes of the spine. Old T8 and T9 compression fractures. No acute osseous pathology. CT ABDOMEN PELVIS FINDINGS No intra-abdominal free air.  Small ascites. Hepatobiliary: Large hypoenhancing masses throughout the liver consistent with metastatic disease and new since the prior CT. The gallbladder is distended. There is dilatation of the common bile duct measuring up to 17 mm in caliber. No stone noted in the gallbladder or in the bowel duct. Pancreas: A 1.4 x 1.1 cm hypoenhancing mass in the proximal pancreas may represent metastatic disease versus primary pancreatic adenocarcinoma. There is mild dilatation of the main pancreatic duct. Spleen: Normal in size without focal abnormality. Adrenals/Urinary Tract: The adrenal glands unremarkable. Right renal inferior pole cyst. There is no hydronephrosis on either side there is symmetric enhancement and excretion of contrast by both  kidneys. The urinary bladder is minimally distended. Trabeculated versus thickened right bladder dome. Stomach/Bowel: Moderate stool throughout the colon. No bowel obstruction. No evidence of acute appendicitis. Vascular/Lymphatic: Advanced aortoiliac atherosclerotic disease. The IVC is unremarkable. No portal venous gas. Retroperitoneal adenopathy measure up to 2 cm in short axis posterior to the IVC and 16 mm short axis posterior to the main portal vein. Right iliac chain or pelvic sidewall adenopathy/implant measures 2.4 x 2.0 cm (93/4). Additional left iliac chain and retroperitoneal adenopathy. Reproductive: The uterus is grossly unremarkable. Other: Diffuse subcutaneous edema. Musculoskeletal: Osteopenia with degenerative changes of the spine. Mixed density, predominantly sclerotic lesion involving the L5 with pathologic fracture and approximately 50% loss of vertebral body height. There is buckling of the posterior cortex with approximately 5 mm retropulsion and focal narrowing of the central canal. Extension of the infiltrative osseous mass/metastasis into the left L5 pedicle and probable extension into the left L5-S1 neural foramina. IMPRESSION: 1. Interval development of metastatic disease in the chest, abdomen, and pelvis as above. 2. A 1.4 x 1.1 cm hypoenhancing mass in the proximal pancreas may represent metastatic disease versus primary pancreatic adenocarcinoma. 3. Metastatic osseous disease involving L5  with associated pathologic fracture and posterior buckling and focal narrowing of the central canal. Aortic Atherosclerosis (ICD10-I70.0). Electronically Signed   By: Vanetta Chou M.D.   On: 05/13/2024 19:02   MR Lumbar Spine W Wo Contrast Result Date: 05/13/2024 EXAM: MRI LUMBAR SPINE 05/13/2024 05:59:24 PM TECHNIQUE: Multiplanar multisequence MRI of the lumbar spine was performed with and without the administration of intravenous contrast. 4 mL of gadobutrol (GADAVIST) 1 MMOL/ML injection was  administered. COMPARISON: CT of the lumbar spine 04/16/2024. CLINICAL HISTORY: Low back pain. Clinical history is abnormal CT of the lumbar spine and low back pain. FINDINGS: BONES AND ALIGNMENT: Normal alignment. Normal vertebral body heights *except for the compression fracture at L5 and superior endplate fracture at L3*. Superior endplate Schmorl nodes are present at T12, L1, and L2. SPINAL CORD: The conus terminates normally. SOFT TISSUES: No paraspinal mass. Enlarged paraaortic lymph nodes measure up to 20 mm on the right and 21 mm on the left. Dilated small bowel present in the right upper quadrant. Atherosclerotic changes are present in the abdominal aorta; largest transverse diameter is 2.8 cm. T12-L1: Superior endplate Schmorl node at T12. No significant disc herniation. No spinal canal stenosis or neural foraminal narrowing. L1-L2: Superior endplate Schmorl node at L1. No significant disc herniation. No spinal canal stenosis or neural foraminal narrowing. L2-L3: Superior endplate Schmorl node at L2. No significant disc herniation. No spinal canal stenosis or neural foraminal narrowing. L3-L4: Superior endplate fracture at L3 is similar to the prior exam. An enhancing mass lesion in the left anterior L3 vertebral body is more obvious than on the prior study, measuring 18 x 13 mm on sagittal images. An enhancing lesion in the right posterolateral L4 vertebral body extends into the pedicle without fracture. A 12 mm lesion is present in the anterior inferior L4 vertebral body. No significant disc herniation. No spinal canal stenosis or neural foraminal narrowing. L4-L5: Diffuse marrow signal abnormality and enhancement is present within the L5 vertebral body. A compression fracture has progressed with minimal height now measuring 12 mm compared to 14 mm previously. Compressed bone extends posteriorly into the spinal canal. Abnormal signal and enhancement extends into the pedicles, left greater than right. No  significant disc herniation. No spinal canal stenosis or neural foraminal narrowing. L5-S1: No significant disc herniation. No spinal canal stenosis or neural foraminal narrowing. IMPRESSION: 1. Multiple osseous lesions consistent with metastatic disease. 2. Progression of L5 compression fracture with minimal additional height loss, with abnormal signal and enhancement extending into the pedicles and bone retropulsed posteriorly into the spinal canal. 3. Enhancing lesions at L4 involving the right posterolateral vertebral body and pedicle and a separate 12 mm lesion in the anterior inferior L4 vertebral body. 4. New L3 superior endplate fracture with an enhancing mass in the left anterior L3 vertebral body measuring 18 x 13 mm. 5. Enlarged paraaortic lymph nodes measuring up to 21 mm, also consistent with metastatic disease these have increased in size since the prior study. Electronically signed by: Lonni Necessary MD 05/13/2024 06:25 PM EST RP Workstation: [id number]    Microbiology: Results for orders placed or performed in visit on 03/17/24  Microscopic Examination     Status: Abnormal   Collection Time: 03/17/24  2:55 PM   Urine  Result Value Ref Range Status   WBC, UA 0-5 0 - 5 /hpf Final   RBC, Urine 0-2 0 - 2 /hpf Final   Epithelial Cells (non renal) >10 (A) 0 - 10 /hpf Final  Bacteria, UA None seen None seen/Few Final    Labs: CBC: Recent Labs  Lab 05/13/24 1628 05/14/24 0348 05/15/24 0431  WBC 15.3* 14.6* 15.2*  NEUTROABS 13.7* 11.7* 12.5*  HGB 10.5* 9.4* 9.4*  HCT 32.7* 30.1* 29.8*  MCV 87.7 88.5 87.9  PLT 366 346 353   Basic Metabolic Panel: Recent Labs  Lab 05/13/24 1604 05/14/24 0348 05/15/24 0431  NA 134* 133* 135  K 5.1 4.4 4.7  CL 99 101 101  CO2 24 22 25   GLUCOSE 93 98 108*  BUN 20 17 14   CREATININE 1.05* 0.90 0.88  CALCIUM 8.5* 8.0* 8.3*  MG  --  2.0  --   PHOS  --  3.1  --    Liver Function Tests: Recent Labs  Lab 05/13/24 1604 05/14/24 0348  05/15/24 0431  AST 43* 40 38  ALT 23 21 20   ALKPHOS 297* 297* 317*  BILITOT 1.2 0.8 0.8  PROT 6.5 5.8* 5.8*  ALBUMIN 3.0* 2.7* 2.7*   CBG: No results for input(s): GLUCAP in the last 168 hours.  Discharge time spent: greater than 30 minutes.  Signed: Bernardino KATHEE Come, MD [facility] 05/17/2024

## 2024-05-17 NOTE — TOC Transition Note (Addendum)
 Transition of Care [facility]) - Discharge Note   Patient Details  Name: Raven Lopez MRN: [id number] Date of Birth: [birth year]  Transition of Care [facility] CM/SW Contact:  Noreen KATHEE Cleotilde ISRAEL Phone Number: 05/17/2024, 12:26 PM   Clinical Narrative:     CSW spoke with patient son and updated him on DC and need of patient auth to come back approved for [facility]. Son agreeable along with patient since [facility] was the only facility able to offer bed. Patient insurance called this clinical research associate regarding Auth being approved. CSW updated Debbie at [facility]. Room and report number given to nurse. Palliative referral sent to [facility] , with son permission. Son updated on DC. Med necessity completed and EMS has been called.  Final next level of care: Skilled Nursing Facility Barriers to Discharge: Barriers Resolved   Patient Goals and CMS Choice Patient states their goals for this hospitalization and ongoing recovery are:: return home CMS Medicare.gov Compare Post Acute Care list provided to:: Patient Represenative (must comment) (Jordan - son) Choice offered to / list presented to : Patient, Adult Children      Discharge Placement              Patient chooses bed at:  [facility]) Patient to be transferred to facility by: [facility] Name of family member notified: Jordan Patient and family notified of of transfer: 05/17/24  Discharge Plan and Services Additional resources added to the After Visit Summary for       Post Acute Care Choice: Durable Medical Equipment                               Social Drivers of Health (SDOH) Interventions SDOH Screenings   Food Insecurity: No Food Insecurity (05/13/2024)  Recent Concern: Food Insecurity - Food Insecurity Present (04/14/2024)  Housing: Low Risk  (05/13/2024)  Transportation Needs: No Transportation Needs (05/13/2024)  Utilities: Not At Risk (05/13/2024)  Alcohol Screen: Low Risk  (09/12/2022)  Depression (PHQ2-9): Low Risk  (05/06/2024)  Recent  Concern: Depression (PHQ2-9) - Medium Risk (04/26/2024)  Financial Resource Strain: Low Risk  (04/14/2024)  Physical Activity: Inactive (02/21/2022)  Social Connections: Unknown (05/13/2024)  Stress: Stress Concern Present (02/21/2022)  Tobacco Use: High Risk (05/13/2024)     Readmission Risk Interventions    05/17/2024   12:24 PM 05/16/2024    9:57 AM 05/15/2024   11:43 AM  Readmission Risk Prevention Plan  Transportation Screening Complete Complete Complete  Home Care Screening Complete Complete Complete  Medication Review (RN CM) Complete Complete Complete

## 2024-05-18 ENCOUNTER — Telehealth

## 2024-05-18 DIAGNOSIS — C7951 Secondary malignant neoplasm of bone: Secondary | ICD-10-CM | POA: Diagnosis not present

## 2024-05-18 DIAGNOSIS — G893 Neoplasm related pain (acute) (chronic): Secondary | ICD-10-CM

## 2024-05-18 DIAGNOSIS — Z515 Encounter for palliative care: Secondary | ICD-10-CM | POA: Diagnosis not present

## 2024-05-18 DIAGNOSIS — M549 Dorsalgia, unspecified: Secondary | ICD-10-CM | POA: Diagnosis not present

## 2024-05-18 DIAGNOSIS — K869 Disease of pancreas, unspecified: Secondary | ICD-10-CM

## 2024-05-18 DIAGNOSIS — C679 Malignant neoplasm of bladder, unspecified: Secondary | ICD-10-CM | POA: Diagnosis not present

## 2024-05-18 DIAGNOSIS — K769 Liver disease, unspecified: Secondary | ICD-10-CM

## 2024-05-18 MED ORDER — CHLORHEXIDINE GLUCONATE CLOTH 2 % EX PADS: 6.0000 | MEDICATED_PAD | Freq: Every day | CUTANEOUS | Status: DC

## 2024-05-18 MED ORDER — ORAL CARE MOUTH RINSE: 15.0000 mL | OROMUCOSAL | Status: DC | PRN

## 2024-05-18 MED ORDER — FENTANYL 12 MCG/HR TD PT72: 1.0000 | MEDICATED_PATCH | TRANSDERMAL | Status: DC

## 2024-05-18 MED ORDER — ACETAMINOPHEN 325 MG PO TABS
650.0000 mg | ORAL_TABLET | Freq: Three times a day (TID) | ORAL | Status: DC
  Administered 2024-05-18 – 2024-05-19 (×5): 650 mg via ORAL
  Filled 2024-05-18 (×5): qty 2

## 2024-05-18 MED ORDER — BOOST / RESOURCE BREEZE PO LIQD CUSTOM
1.0000 | Freq: Three times a day (TID) | ORAL | Status: DC
  Administered 2024-05-19 (×2): 1 via ORAL

## 2024-05-18 MED ORDER — SENNA 8.6 MG PO TABS
1.0000 | ORAL_TABLET | Freq: Every day | ORAL | Status: DC
  Administered 2024-05-18 – 2024-05-19 (×2): 8.6 mg via ORAL
  Filled 2024-05-18 (×2): qty 1

## 2024-05-18 MED ORDER — ADULT MULTIVITAMIN LIQUID CH
15.0000 mL | Freq: Every day | ORAL | Status: DC
  Administered 2024-05-18 – 2024-05-19 (×2): 15 mL via ORAL
  Filled 2024-05-18 (×3): qty 15

## 2024-05-18 NOTE — Progress Notes (Signed)
 PROGRESS NOTE    Patient: Lawrnce JAYSON Lopez                            PCP: GroomsCharmaine, [place]                    DOB: May 17, 1956            DOA: 05/17/2024 FMW:[id number]             DOS: 05/18/2024, 11:54 AM   LOS: 0 days   Date of Service: The patient was seen and examined on 05/18/2024  Subjective:   The patient was seen and examined this morning. Hemodynamically stable. Still complaining of back pain, difficulty ambulating, needs assist with ADLs No issues overnight .  Brief Narrative:   Raven Lopez is a 68 y.o. female with medical history significant for metastatic bladder cancer, back pain.  Patient's was just discharged from the hospital, she was hospitalized 1/6 to 11/10 for intractable back pain, urinary tract infection.  She was discharged to nursing home.  On getting there, patient reported she could not stay at the nursing home, noise was too much, the bed was too hard, patient also has PTSD from husband staying in the nursing home, and she was still having back pain.  Patient wanted to come back to the hospital or go home.  But per sister and son at bedside, the house is not ready, patient's buttocks are raw and the will likely need a change in bed, and they are unable to care for patient at this time.  She was ambulating up until 2 weeks ago.      Assessment & Plan:   Principal Problem:   Intractable back pain Active Problems:   Malignant neoplasm of urinary bladder (HCC)   Compression fracture of fifth lumbar vertebra (HCC)  Intractable back pain - Still complaining of discomfort, pain difficulty ambulating and caring her ADLs  - Intractable back pain due to vertebral metastatic disease and pathologic compression fractures, no focal neurologic deficits... Denies any numbness or tingling in her lower extremities   -  Family requesting previous oral oxycodone  p.o. be resumed and no longer want oxycodone  solution.   - Family refusing SNF, patient deconditioned,  unable to ambulate, multiple complaints, requesting palliative care services,  - Palliative consult has been placed   - Per discharge summary by Dr. Bryn - D/w IR Re: kyphoplasty. They recommend referral for outpatient discussion of OsteoCool (RFA) +/- KP. This has been done, and the patient will be contacted to schedule this appointment.   - PT/OT eval - Patient requesting to be discharged discharge home-but has severe debility, TOC, palliative care and discussion in a safe discharge home likely with home health    Uncomplicated UTI complete Keflex   Metastatic bladder CA:  - Oncology consulted, appreciate their assessment. Will follow up 11/20 as planned. Pt contemplating another round of chemotherapy.    Oral candidiasis - Resume Diflucan , nystatin   Severe protein-calorie malnutrition, cachexia, hypoalbuminemia, generalized weakness, failure to thrive: BMI is 15, very deconditioned.  -Ensure  -------------------------------------------------------------------------------------------------------------------------------------- Nutritional status:  The patient's BMI is: Body mass index is 15.36 kg/m. I agree with the assessment and plan as outlined  Nutrition Status: Nutrition Problem: Severe Malnutrition Etiology: chronic illness (metastatic bladder cancer) Signs/Symptoms: severe muscle depletion, severe fat depletion Interventions: Boost Breeze, Magic cup, MVI  ----------------------------------------------------------------------------------------------------------------------------------------  DVT prophylaxis:  enoxaparin (LOVENOX) injection 30 mg Start:  05/17/24 2230   Code Status:   Code Status: Full Code  Family Communication: Reaching out to patient's son -Advance care planning has been discussed.   Admission status:   Status is: Observation The patient remains OBS appropriate and will d/c before 2 midnights.   Disposition: From  - home             Planning  for discharge in 1-2 days   Procedures:   No admission procedures for hospital encounter.   Antimicrobials:  Anti-infectives (From admission, onward)    Start     Dose/Rate Route Frequency Ordered Stop   05/18/24 2200  fluconazole  (DIFLUCAN ) tablet 100 mg        100 mg Oral Daily at bedtime 05/17/24 2228     05/17/24 2315  cephALEXin (KEFLEX) 250 MG/5ML suspension 500 mg        500 mg Oral 2 times daily 05/17/24 2228          Medication:   cephALEXin  500 mg Oral BID   enoxaparin (LOVENOX) injection  30 mg Subcutaneous Q24H   feeding supplement  1 Container Oral TID BM   fluconazole   100 mg Oral QHS   multivitamin  15 mL Oral Daily   nicotine  14 mg Transdermal Daily    acetaminophen  **OR** acetaminophen , diazepam , ondansetron  **OR** ondansetron  (ZOFRAN ) IV, oxyCODONE , polyethylene glycol   Objective:   Vitals:   05/17/24 2100 05/17/24 2103 05/18/24 0128 05/18/24 0439  BP: (!) 131/101 (!) 131/101 (!) 130/96 (!) 146/80  Pulse: 97 95 94 91  Resp:  18 18 17   Temp:  98.6 F (37 C) 98.1 F (36.7 C) 98.4 F (36.9 C)  TempSrc:  Oral Oral Oral  SpO2: 98% 97% 99% 97%  Weight:      Height:        Intake/Output Summary (Last 24 hours) at 05/18/2024 1154 Last data filed at 05/18/2024 0446 Gross per 24 hour  Intake --  Output 150 ml  Net -150 ml   Filed Weights   05/17/24 1722  Weight: 38.1 kg     Physical examination:    General:  AAO x 3,  cooperative, no distress;   HEENT:  Normocephalic, PERRL, otherwise with in Normal limits   Neuro:  CNII-XII intact. , normal motor and sensation, reflexes intact   Lungs:   Clear to auscultation BL, Respirations unlabored,  No wheezes / crackles  Cardio:    S1/S2, RRR, No murmure, No Rubs or Gallops   Abdomen:  Soft, non-tender, bowel sounds active all four quadrants, no guarding or peritoneal signs.  Muscular  skeletal:  Limited exam -global generalized weaknesses - in bed, able to move all 4 extremities,   2+  pulses,  symmetric, No pitting edema  Skin:  Dry, warm to touch, negative for any Rashes,  Wounds: Please see nursing documentation ---------------------------------------------------------------------------------------------------------------------------------    LABs:     Latest Ref Rng & Units 05/17/2024    9:13 PM 05/15/2024    4:31 AM 05/14/2024    3:48 AM  CBC  WBC 4.0 - 10.5 K/uL 14.7  15.2  14.6   Hemoglobin 12.0 - 15.0 g/dL 88.7  9.4  9.4   Hematocrit 36.0 - 46.0 % 35.5  29.8  30.1   Platelets 150 - 400 K/uL 317  353  346       Latest Ref Rng & Units 05/17/2024    9:13 PM 05/15/2024    4:31 AM 05/14/2024    3:48 AM  CMP  Glucose 70 - 99 mg/dL 877  891  98   BUN 8 - 23 mg/dL 17  14  17    Creatinine 0.44 - 1.00 mg/dL 8.96  9.11  9.09   Sodium 135 - 145 mmol/L 134  135  133   Potassium 3.5 - 5.1 mmol/L 4.5  4.7  4.4   Chloride 98 - 111 mmol/L 99  101  101   CO2 22 - 32 mmol/L 24  25  22    Calcium 8.9 - 10.3 mg/dL 8.4  8.3  8.0   Total Protein 6.5 - 8.1 g/dL  5.8  5.8   Total Bilirubin 0.0 - 1.2 mg/dL  0.8  0.8   Alkaline Phos 38 - 126 U/L  317  297   AST 15 - 41 U/L  38  40   ALT 0 - 44 U/L  20  21        Micro Results No results found for this or any previous visit (from the past 240 hours).  Radiology Reports No results found.  SIGNED: Adriana DELENA Grams, MD, FHM. FAAFP. Jolynn Pack - [facility] Time spent - 55 min.  In seeing, evaluating and examining the patient. Reviewing medical records, labs, drawn plan of care. [facility],  Pager (please use [email or web address] to page/ text) Please use Epic Secure Chat for non-urgent communication (7AM-7PM)  If 7PM-7AM, please contact night-coverage [email or web address], 05/18/2024, 11:54 AM

## 2024-05-18 NOTE — Progress Notes (Signed)
 Patient requires frequent re-positioning of the body in ways that cannot be achieved with an ordinary bed or wedge pillow, to eliminate pain, reduce pressure, and the head of the bed to be elevated more than 30 degrees most of the time due to Compression fracture of fifth lumbar vertebra and intractable back pain.

## 2024-05-18 NOTE — Evaluation (Signed)
 Occupational Therapy Evaluation Patient Details Name: Raven Lopez MRN: [id number] DOB: 10/27/1955 Today's Date: 05/18/2024   History of Present Illness   Raven Lopez is a 68 y.o. female with medical history significant for metastatic bladder cancer, back pain.  Patient's was just discharged from the hospital, she was hospitalized 1/6 to 11/10 for intractable back pain, urinary tract infection.  She was discharged to nursing home.  On getting there, patient reported she could not stay at the nursing home, noise was too much, the bed was too hard, patient also has PTSD from husband staying in the nursing home, and she was still having back pain.  Patient wanted to come back to the hospital or go home.  But per sister and son at bedside, the house is not ready, patient's buttocks are raw and the will likely need a change in bed, and they are unable to care for patient at this time.  She was ambulating up until 2 weeks ago.    Patient on my evaluation still with back pain, but has no other complaints. (per MD)     Clinical Impressions Pt agreeable to OT and PT co-evaluation. Pt required min to mod A for EOB to chair transfer with RW. Mod A required for supine to sit bed mobility as well. Pt is able to complete seated ADL's with set up assist, but is unsteady in standing and functional mobility with RW. Pt's B UE are also generally weak. Pt left in the chair with call bell within reach. Pt will benefit from continued OT in the hospital to increase strength, balance, and endurance for safe ADL's.        If plan is discharge home, recommend the following:   A lot of help with walking and/or transfers;A little help with bathing/dressing/bathroom;Assistance with cooking/housework;Assist for transportation;Help with stairs or ramp for entrance     Functional Status Assessment   Patient has had a recent decline in their functional status and demonstrates the ability to make significant  improvements in function in a reasonable and predictable amount of time.     Equipment Recommendations   None recommended by OT             Precautions/Restrictions   Precautions Precautions: Fall Recall of Precautions/Restrictions: Intact Restrictions Weight Bearing Restrictions Per Provider Order: No     Mobility Bed Mobility Overal bed mobility: Needs Assistance Bed Mobility: Supine to Sit     Supine to sit: Mod assist     General bed mobility comments: labored movement; extended time; assist for trunk elevation.    Transfers Overall transfer level: Needs assistance Equipment used: Rolling walker (2 wheels) Transfers: Sit to/from Stand, Bed to chair/wheelchair/BSC Sit to Stand: Mod assist     Step pivot transfers: Min assist     General transfer comment: 2 attempts needed for sit to stand from EOB due to pt quickly sitting after pain in standing.      Balance Overall balance assessment: Needs assistance Sitting-balance support: Feet supported, No upper extremity supported Sitting balance-Leahy Scale: Fair Sitting balance - Comments: while seated EOB   Standing balance support: During functional activity, Bilateral upper extremity supported, Reliant on assistive device for balance Standing balance-Leahy Scale: Fair Standing balance comment: w/ RW                           ADL either performed or assessed with clinical judgement   ADL Overall ADL's : Needs  assistance/impaired     Grooming: Set up;Sitting   Upper Body Bathing: Set up;Sitting   Lower Body Bathing: Set up;Sitting/lateral leans   Upper Body Dressing : Set up;Sitting   Lower Body Dressing: Set up;Sitting/lateral leans   Toilet Transfer: Moderate assistance;Rolling walker (2 wheels) Toilet Transfer Details (indicate cue type and reason): EOB to chair with RW Toileting- Clothing Manipulation and Hygiene: Contact guard assist;Set up;Sitting/lateral lean        Functional mobility during ADLs: Moderate assistance;Rolling walker (2 wheels)       Vision Baseline Vision/History: 0 No visual deficits Ability to See in Adequate Light: 0 Adequate Patient Visual Report: No change from baseline Vision Assessment?: No apparent visual deficits     Perception Perception: Not tested       Praxis Praxis: Not tested       Pertinent Vitals/Pain Pain Assessment Pain Assessment: 0-10 Pain Score: 5  Pain Location: low back Pain Descriptors / Indicators: Discomfort, Guarding Pain Intervention(s): Limited activity within patient's tolerance, Monitored during session, Repositioned     Extremity/Trunk Assessment Upper Extremity Assessment Upper Extremity Assessment: Generalized weakness   Lower Extremity Assessment Lower Extremity Assessment: Defer to PT evaluation   Cervical / Trunk Assessment Cervical / Trunk Assessment: Kyphotic   Communication Communication Communication: No apparent difficulties   Cognition Arousal: Alert Behavior During Therapy: WFL for tasks assessed/performed, Flat affect Cognition: No apparent impairments                               Following commands: Intact       Cueing  General Comments   Cueing Techniques: Verbal cues;Visual cues                 Home Living Family/patient expects to be discharged to:: Private residence Living Arrangements: Children;Other relatives Available Help at Discharge: Family;Available 24 hours/day Type of Home: House Home Access: Stairs to enter [employer] of Steps: 2 STE in front without rails and 4-5 STE in back with rails Entrance Stairs-Rails: Can reach both Home Layout: One level     Bathroom Shower/Tub: Chief Strategy Officer: Handicapped height Bathroom Accessibility: Yes   Home Equipment: Agricultural Consultant (2 wheels);Cane - single point;Wheelchair - manual;BSC/3in1;Shower seat;Grab bars - tub/shower   Additional  Comments: Information per previous admission.      Prior Functioning/Environment Prior Level of Function : Independent/Modified Independent             Mobility Comments: reports as a household ambulator mostly using RW, hasn't driven in a month, no falls (per chart) ADLs Comments: independent with ADLs, assisted with iADLs.    OT Problem List: Decreased strength;Decreased activity tolerance;Impaired balance (sitting and/or standing);Pain   OT Treatment/Interventions: Self-care/ADL training;Therapeutic exercise;Therapeutic activities;Patient/family education;Balance training      OT Goals(Current goals can be found in the care plan section)   Acute Rehab OT Goals Patient Stated Goal: improve function OT Goal Formulation: With patient Time For Goal Achievement: 06/01/24 Potential to Achieve Goals: Good   OT Frequency:  Min 2X/week    Co-evaluation PT/OT/SLP Co-Evaluation/Treatment: Yes Reason for Co-Treatment: To address functional/ADL transfers PT goals addressed during session: Mobility/safety with mobility OT goals addressed during session: ADL's and self-care      AM-PAC OT 6 Clicks Daily Activity     Outcome Measure Help from another person eating meals?: None Help from another person taking care of personal grooming?: A Little Help from another person  toileting, which includes using toliet, bedpan, or urinal?: A Little Help from another person bathing (including washing, rinsing, drying)?: A Little Help from another person to put on and taking off regular upper body clothing?: A Little Help from another person to put on and taking off regular lower body clothing?: A Little 6 Click Score: 19   End of Session Equipment Utilized During Treatment: Rolling walker (2 wheels)  Activity Tolerance: Patient tolerated treatment well Patient left: in chair;with call bell/phone within reach  OT Visit Diagnosis: Unsteadiness on feet (R26.81);Muscle weakness  (generalized) (M62.81);Pain Pain - part of body:  (back)                Time: 9161-9143 OT Time Calculation (min): 18 min Charges:  OT General Charges $OT Visit: 1 Visit OT Evaluation $OT Eval Low Complexity: 1 Low Sheree Lalla OT, MOT   Jayson Person 05/18/2024, 2:48 PM

## 2024-05-18 NOTE — Plan of Care (Signed)
  Problem: Acute Rehab PT Goals(only PT should resolve) Goal: Pt Will Go Supine/Side To Sit Outcome: Progressing Flowsheets (Taken 05/18/2024 1203) Pt will go Supine/Side to Sit: with minimal assist Goal: Patient Will Transfer Sit To/From Stand Outcome: Progressing Flowsheets (Taken 05/18/2024 1203) Patient will transfer sit to/from stand: with supervision Goal: Pt Will Transfer Bed To Chair/Chair To Bed Outcome: Progressing Flowsheets (Taken 05/18/2024 1203) Pt will Transfer Bed to Chair/Chair to Bed: with contact guard assist Goal: Pt Will Ambulate Outcome: Progressing Flowsheets (Taken 05/18/2024 1203) Pt will Ambulate:  15 feet  with rolling walker  with contact guard assist    12:04 PM, 05/18/24 Rosaria Settler, PT, DPT [place] with [facility]

## 2024-05-18 NOTE — Evaluation (Signed)
 Physical Therapy Evaluation Patient Details Name: Raven Lopez MRN: [id number] DOB: 1955-11-21 Today's Date: 05/18/2024  History of Present Illness  Raven Lopez is a 68 y.o. female with medical history significant for bladder cancer(07/29/22) status post chemotherapy and radiation therapy, generalized anxiety disorder, current tobacco user (1ppd), who presents to the ER from home due to severe back pain.  Her back pain has been ongoing for the past 2 months and worsening in the last 2 days.  Denies any weakness, numbness, or tingling in her extremities.  Denies loss of sensation with bowel or bladder.  Was diagnosed with compression fracture at L5 on 04/16/2024.  The pain has been progressively worsening.  It is so severe that she has not been able to get out of bed without assistance.  Has also been eating less.  Has not seen her oncologist recently.   Clinical Impression  Patient agreeable to OT/PT co-evaluation. Patient reports at baseline, patient is at least a household ambulator with RW, and independent with ADL. Family assists with iADLs. Reports no falls. This date, patient requires moderate assist for bed mobility, functional transfers, and very limited ambulation with RW. Pt limited mostly due to low back pain but notable improvement from evaluation performed a few days ago. Patient generally weak throughout, and demonstrates decreased endurance, and impaired balance as well. Pt tolerates sitting up in recliner at end of session, call button within reach and all needs met. Patient will benefit from continued skilled physical therapy acutely and in recommended venue in order to address the above/below deficits to improve overall function.        If plan is discharge home, recommend the following: A little help with walking and/or transfers;Assist for transportation;Assistance with cooking/housework;A little help with bathing/dressing/bathroom;Help with stairs or ramp for entrance   Can  travel by private vehicle        Equipment Recommendations None recommended by PT  Recommendations for Other Services       Functional Status Assessment Patient has had a recent decline in their functional status and demonstrates the ability to make significant improvements in function in a reasonable and predictable amount of time.     Precautions / Restrictions Precautions Precautions: Fall Recall of Precautions/Restrictions: Intact Restrictions Weight Bearing Restrictions Per Provider Order: No      Mobility  Bed Mobility Overal bed mobility: Needs Assistance Bed Mobility: Supine to Sit     Supine to sit: Mod assist     General bed mobility comments: HOB flat, pt demo slow labored movement, mod assist requiring support of therapist arm to elevated trunk from bed    Transfers Overall transfer level: Needs assistance Equipment used: Rolling walker (2 wheels) Transfers: Sit to/from Stand, Bed to chair/wheelchair/BSC Sit to Stand: Mod assist   Step pivot transfers: Min assist, Contact guard assist       General transfer comment: Mod assist due to LE weakness, managing RW, and verbal cueing for hand placement during STS. Pt tends to use both hands on RW and it tilts during first stand, required blocking assist. improvement with second stand and verbal cueing. Very unsteady with steps from bed to recliner    Ambulation/Gait Ambulation/Gait assistance: Min assist, Contact guard assist Gait Distance (Feet): 3 Feet Assistive device: Rolling walker (2 wheels) Gait Pattern/deviations: Step-to pattern, Decreased step length - right, Decreased step length - left, Trunk flexed Gait velocity: Dec     General Gait Details: Pt limited to a few side steps at bedside with  RW and CGA/min A due to unsteadiness and low back pain.  Stairs            Wheelchair Mobility     Tilt Bed    Modified Rankin (Stroke Patients Only)       Balance Overall balance assessment:  Needs assistance Sitting-balance support: Feet supported, No upper extremity supported Sitting balance-Leahy Scale: Fair Sitting balance - Comments: while seated EOB   Standing balance support: During functional activity, Bilateral upper extremity supported, Reliant on assistive device for balance Standing balance-Leahy Scale: Fair Standing balance comment: w/ RW           Pertinent Vitals/Pain Pain Assessment Pain Assessment: 0-10 Pain Score: 5  Pain Location: low back Pain Descriptors / Indicators: Aching, Grimacing, Discomfort Pain Intervention(s): Limited activity within patient's tolerance, Repositioned, Monitored during session    Home Living Family/patient expects to be discharged to:: Private residence Living Arrangements: Children;Other relatives Available Help at Discharge: Family;Available 24 hours/day Type of Home: House Home Access: Stairs to enter Entrance Stairs-Rails: Can reach both Entrance Stairs-Number of Steps: 2 STE in front without rails and 4-5 STE in back with rails   Home Layout: One level Home Equipment: Agricultural Consultant (2 wheels);Cane - single point;Wheelchair - manual;BSC/3in1;Shower seat;Grab bars - tub/shower Additional Comments: Info from previous chart, Reports she has toilet riser, and suction grab bars in shower. Pt's son and sister live next door and across the street.    Prior Function Prior Level of Function : Independent/Modified Independent     Mobility Comments: reports as a household ambulator mostly using RW, hasn't driven in a month, no falls ADLs Comments: independent with ADLs, assisted with iADLs.     Extremity/Trunk Assessment   Upper Extremity Assessment Upper Extremity Assessment: Defer to OT evaluation    Lower Extremity Assessment Lower Extremity Assessment: Generalized weakness (pt generally weak and frail, ankle DF MMT 4-/5 bilat, hip flexion MMT 3+/5)    Cervical / Trunk Assessment Cervical / Trunk Assessment:  Kyphotic  Communication   Communication Communication: No apparent difficulties    Cognition Arousal: Alert Behavior During Therapy: WFL for tasks assessed/performed, Flat affect     Following commands: Intact       Cueing Cueing Techniques: Verbal cues, Visual cues     General Comments      Exercises     Assessment/Plan    PT Assessment Patient needs continued PT services;All further PT needs can be met in the next venue of care  PT Problem List Decreased strength;Decreased mobility;Decreased activity tolerance;Pain       PT Treatment Interventions DME instruction;Gait training;Stair training;Functional mobility training;Therapeutic activities;Therapeutic exercise;Balance training;Patient/family education    PT Goals (Current goals can be found in the Care Plan section)  Acute Rehab PT Goals Patient Stated Goal: return home PT Goal Formulation: With patient Time For Goal Achievement: 05/25/24 Potential to Achieve Goals: Good    Frequency Min 3X/week     Co-evaluation PT/OT/SLP Co-Evaluation/Treatment: Yes Reason for Co-Treatment: To address functional/ADL transfers PT goals addressed during session: Mobility/safety with mobility         AM-PAC PT 6 Clicks Mobility  Outcome Measure Help needed turning from your back to your side while in a flat bed without using bedrails?: A Little Help needed moving from lying on your back to sitting on the side of a flat bed without using bedrails?: A Lot Help needed moving to and from a bed to a chair (including a wheelchair)?: A Little Help needed standing up  from a chair using your arms (e.g., wheelchair or bedside chair)?: A Little Help needed to walk in hospital room?: A Lot Help needed climbing 3-5 steps with a railing? : A Lot 6 Click Score: 15    End of Session Equipment Utilized During Treatment: Gait belt Activity Tolerance: Patient limited by pain;Patient limited by fatigue Patient left: in chair;with call  bell/phone within reach   PT Visit Diagnosis: Other abnormalities of gait and mobility (R26.89);Muscle weakness (generalized) (M62.81);Pain;Difficulty in walking, not elsewhere classified (R26.2) Pain - part of body:  (low back)    Time: 9159-9143 PT Time Calculation (min) (ACUTE ONLY): 16 min   Charges:   PT Evaluation $PT Eval Low Complexity: 1 Low   PT General Charges $$ ACUTE PT VISIT: 1 Visit         12:01 PM, 05/18/24 Khanh Tanori Powell-Butler, PT, DPT [place] with [facility]

## 2024-05-18 NOTE — Progress Notes (Signed)
 Initial Nutrition Assessment  DOCUMENTATION CODES:   Severe malnutrition in context of chronic illness, Underweight  INTERVENTION:   Boost Breeze po TID, each supplement provides 250 kcal and 9 grams of protein Liquid MVI with minerals daily Magic cup TID with meals, each supplement provides 290 kcal and 9 grams of protein  NUTRITION DIAGNOSIS:   Severe Malnutrition related to chronic illness (metastatic bladder cancer) as evidenced by severe muscle depletion, severe fat depletion.  GOAL:   Patient will meet greater than or equal to 90% of their needs  MONITOR:   PO intake, Supplement acceptance  REASON FOR ASSESSMENT:   Rounds    ASSESSMENT:   68 yo female admitted with uncontrolled back pain d/t vertebral metastatic disease and pathologic compression fractures. PMH includes metastatic bladder cancer, compression fracture L5, scoliosis, anxiety, HTN, COPD, AC joint bone spurs, tobacco use.  Patient reports decreased intake recently. She has tried Ensure supplement, but doesn't like them. She does not like milky things. She tried and liked the [employer] (wild berry flavor) and agreed to drink them between meals. She would also like to try magic cups with meals.    Weight history reviewed.  No significant weight changes noted within the past year.  She is chronically underweight with BMI=15.  Labs reviewed.  Na 134 Creatinine 1.03  Medications reviewed.  Patient meets criteria for severe malnutrition, given severe depletion of muscle and subcutaneous fat mass.  NUTRITION - FOCUSED PHYSICAL EXAM:  Flowsheet Row Most Recent Value  Orbital Region Severe depletion  Upper Arm Region Severe depletion  Thoracic and Lumbar Region Severe depletion  Buccal Region Mild depletion  Temple Region Severe depletion  Clavicle Bone Region Severe depletion  Clavicle and Acromion Bone Region Severe depletion  Scapular Bone Region Severe depletion  Dorsal Hand Severe depletion   Patellar Region Severe depletion  Anterior Thigh Region Severe depletion  Posterior Calf Region Severe depletion  Edema (RD Assessment) Mild  Hair Unable to assess  Eyes Reviewed  Mouth Reviewed  Skin Reviewed  Nails Reviewed    Diet Order:   Diet Order             Diet regular Room service appropriate? Yes; Fluid consistency: Thin  Diet effective now                   EDUCATION NEEDS:   Education needs have been addressed  Skin:  Skin Assessment: Reviewed RN Assessment (erythema/redness to sacrum/buttocks)  Last BM:  no BM documented this admission  Height:   Ht Readings from Last 1 Encounters:  05/17/24 5' 2 (1.575 m)    Weight:   Wt Readings from Last 1 Encounters:  05/17/24 38.1 kg    Ideal Body Weight:  50 kg  BMI:  Body mass index is 15.36 kg/m.  Estimated Nutritional Needs:   Kcal:  1300-1500  Protein:  65-75 gm  Fluid:  1.5 L   Suzen HUNT RD, LDN, CNSC Contact via secure chat. If unavailable, use group chat RD Inpatient.

## 2024-05-18 NOTE — Plan of Care (Signed)
  Problem: Acute Rehab OT Goals (only OT should resolve) Goal: Pt. Will Perform Grooming Flowsheets (Taken 05/18/2024 1451) Pt Will Perform Grooming:  with modified independence  standing Goal: Pt. Will Perform Lower Body Dressing Flowsheets (Taken 05/18/2024 1451) Pt Will Perform Lower Body Dressing: with modified independence Goal: Pt. Will Transfer To Toilet Flowsheets (Taken 05/18/2024 1451) Pt Will Transfer to Toilet:  with modified independence  ambulating Goal: Pt. Will Perform Toileting-Clothing Manipulation Flowsheets (Taken 05/18/2024 1451) Pt Will Perform Toileting - Clothing Manipulation and hygiene:  with modified independence  sit to/from stand  sitting/lateral leans Goal: Pt/Caregiver Will Perform Home Exercise Program Flowsheets (Taken 05/18/2024 1451) Pt/caregiver will Perform Home Exercise Program:  Increased strength  Both right and left upper extremity  Independently  Patricia Fargo OT, MOT

## 2024-05-18 NOTE — Consult Note (Signed)
 Consultation Note Date: 05/18/2024   Patient Name: Raven Lopez  DOB: 1955-09-28  MRN: [id number]  Age / Sex: 68 y.o., female  PCP: Grooms, Charmaine, PA-C Referring Physician: Willette Adriana LABOR, MD  Reason for Consultation:  considering dispo to home with palliative assistance in pain management  HPI/Patient Profile: 68 y.o. female  with past medical history of bladder cancer- s/p TURBT, radiation and chemotherapy- was on surveillance, had recent admission with back pain and found to have mets to spine, liver, and pancreas vs new primary- admitted on 05/17/2024 after going to SNF and having a panic attack. Palliative medicine consulted for pain management.    Primary Decision Maker PATIENT- with support from her son, Jordan  Discussion: Chart reviewed including labs, progress notes, imaging from this and previous encounters.  Patient currently using fentanyl  12.5mcg patch, and has had 15 mg oxycodone  po in last 24 hours.  CT scan reviewed showing likely malignant lesions in her pancreas, liver, and multiple vertebra. Met with patient. She was awake, alert, and oriented. She shares that her pain is currently well controlled with regimen in place and did not need adjusting.  We discussed her goals of care. She is hopeful to have oncology consult to discuss treatment options for her cancer.  She would like to return home.  Advanced care planning was discussed. She would not want long term artificial life support. She notes that Jordan knows I want DNR. We discussed that DNR means Do Not Resusciate- no CPR in the event that she decompensated to the point of cardiac and respiratory arrest. I offered to change her code status, however, she declined and requested to remain full code for now until she can discuss with her son.  I called patient's son Jordan. He confirmed goals are to proceed with Oncology consult  for treatment options- both medical and radiation.  We discussed Palliative services at home. We discussed methods of pain management for his mom.  Jordan would like his mom to be discharged home with him and his aunt helping to care for her. He would like assistance from Palliative at home. I clarified the scope of Palliative medicine in the community. I also discussed that if after Oncology consult patient chose not to proceed with further treatment then she would be eligible for hospice services.  I reviewed my discussion with Jordan regarding patient's feelings about code status. Encouraged patient/family to consider DNR/DNI status understanding evidenced based poor outcomes in similar hospitalized patients, as the cause of the arrest is likely associated with chronic/terminal disease rather than a reversible acute cardio-pulmonary event.  Jordan agrees that CPR would likely harm patient more than it would help her. He plans to discuss with her. I let him know that he could call me or notify any provider if they would like to change her code status to DNR.     SUMMARY OF RECOMMENDATIONS -Continue fentanyl  12 mcg patch q72hr -Continue oxycodone  po 5mg  q4hr prn -Start acetaminophen  650mg  po TID -TOC order placed for referral to  outpatient Palliative -Recommend medical and radiation oncology consults - radiation would likely assist with pain control    Code Status/Advance Care Planning:   Code Status: Full Code    Prognosis:   Unable to determine  Discharge Planning: Home with Palliative Services  Primary Diagnoses: Present on Admission:  Intractable back pain  Compression fracture of fifth lumbar vertebra (HCC)  Malignant neoplasm of urinary bladder (HCC)   Review of Systems  Physical Exam  Vital Signs: BP (!) 146/80 (BP Location: Right Arm)   Pulse 91   Temp 98.4 F (36.9 C) (Oral)   Resp 17   Ht 5' 2 (1.575 m)   Wt 38.1 kg   SpO2 97%   BMI 15.36 kg/m  Pain Scale:  0-10   Pain Score: 6    SpO2: SpO2: 97 % O2 Device:SpO2: 97 % O2 Flow Rate: .   IO: Intake/output summary:  Intake/Output Summary (Last 24 hours) at 05/18/2024 1221 Last data filed at 05/18/2024 0446 Gross per 24 hour  Intake --  Output 150 ml  Net -150 ml    LBM:   Baseline Weight: Weight: 38.1 kg Most recent weight: Weight: 38.1 kg       Thank you for this consult. Palliative medicine will continue to follow and assist as needed.  Time Total: 90 minutes Signed by: Cassondra Stain, AGNP-C Palliative Medicine  Time includes:   Preparing to see the patient (e.g., review of tests) Obtaining and/or reviewing separately obtained history Performing a medically necessary appropriate examination and/or evaluation Counseling and educating the patient/family/caregiver Ordering medications, tests, or procedures Referring and communicating with other health care professionals (when not reported separately) Documenting clinical information in the electronic or other health record Independently interpreting results (not reported separately) and communicating results to the patient/family/caregiver Care coordination (not reported separately) Clinical documentation   Please contact Palliative Medicine Team phone at [phone] for questions and concerns.  For individual provider: See Tracey

## 2024-05-18 NOTE — Plan of Care (Signed)
  Problem: Coping: Goal: Level of anxiety will decrease Outcome: Progressing   Problem: Pain Managment: Goal: General experience of comfort will improve and/or be controlled Outcome: Progressing   Problem: Safety: Goal: Ability to remain free from injury will improve Outcome: Progressing   Problem: Skin Integrity: Goal: Risk for impaired skin integrity will decrease Outcome: Progressing

## 2024-05-18 NOTE — TOC Initial Note (Signed)
 Transition of Care [facility]) - Initial/Assessment Note    Patient Details  Name: Raven Lopez MRN: [id number] Date of Birth: 1956-03-09  [facility]) CM/SW Contact:    Lucie Lunger, LCSWA Phone Number: 05/18/2024, 3:07 PM  Clinical Narrative:                 [facility] consulted for SNF placement. CSW notes that PT is recommending SNF for pt. CSW spoke with pt at bedside to complete assessment. Pt states that she lives alone but her son lives beside her. Pt states that she is able to complete ADLs and son drives her when needed. Pt states that she is not interested in SNF placement and that she would like to return home with [facility], she has no agency preference. Pt states she has a cane, walker, wheelchair and BSC to use in the home. Pt gave consent for CSW to follow up with her son regarding plan for return home with [facility]. CSW spoke with pts son Jordan to review plan for [facility] vs SNF. He states he is agreeable to pt returning home with [facility] services. They are requesting outpatient palliative referral be made to [facility]. CSW gave referral to [facility] with [facility]. CSW sent [facility] referral out in HUB for agency review. Pts son requesting hospital bed for pt. CSW sent referral to Zach with [facility]. TOC to follow.   Expected Discharge Plan: Home w Home Health Services Barriers to Discharge: Continued Medical Work up   Patient Goals and CMS Choice Patient states their goals for this hospitalization and ongoing recovery are:: return home CMS Medicare.gov Compare Post Acute Care list provided to:: Patient Choice offered to / list presented to : Adult Children      Expected Discharge Plan and Services In-house Referral: Clinical Social Work Discharge Planning Services: CM Consult Post Acute Care Choice: Durable Medical Equipment Living arrangements for the past 2 months: Single Family Home                 DME Arranged: Hospital bed DME Agency: [facility] Date DME Agency Contacted: 05/18/24    Representative spoke with at DME Agency: Zack            Prior Living Arrangements/Services Living arrangements for the past 2 months: Single Family Home Lives with:: Self Patient language and need for interpreter reviewed:: Yes Do you feel safe going back to the place where you live?: Yes      Need for Family Participation in Patient Care: Yes (Comment) Care giver support system in place?: Yes (comment) Current home services: DME, Home PT, Home RN Criminal Activity/Legal Involvement Pertinent to Current Situation/Hospitalization: No - Comment as needed  Activities of Daily Living   ADL Screening (condition at time of admission) Independently performs ADLs?: No Does the patient have a NEW difficulty with bathing/dressing/toileting/self-feeding that is expected to last >3 days?: Yes (Initiates electronic notice to provider for possible OT consult) Does the patient have a NEW difficulty with getting in/out of bed, walking, or climbing stairs that is expected to last >3 days?: Yes (Initiates electronic notice to provider for possible PT consult) Does the patient have a NEW difficulty with communication that is expected to last >3 days?: No Is the patient deaf or have difficulty hearing?: No Does the patient have difficulty seeing, even when wearing glasses/contacts?: No Does the patient have difficulty concentrating, remembering, or making decisions?: Yes  Permission Sought/Granted  Emotional Assessment Appearance:: Appears stated age Attitude/Demeanor/Rapport: Engaged Affect (typically observed): Accepting Orientation: : Oriented to Situation, Oriented to  Time, Oriented to Place, Oriented to Self Alcohol / Substance Use: Not Applicable Psych Involvement: No (comment)  Admission diagnosis:  Intractable back pain [M54.9] Patient Active Problem List   Diagnosis Date Noted   Intractable back pain 05/17/2024   Encounter for palliative care 05/15/2024    Back pain 05/13/2024   Compression fracture of fifth lumbar vertebra (HCC) 04/26/2024   Cachexia 04/05/2024   Difficulty with activities of daily living 04/05/2024   Physical deconditioning 04/05/2024   Chronic midline low back pain without sciatica 04/05/2024   Hypotension due to drugs 04/05/2024   Malignant neoplasm of urinary bladder (HCC) 07/29/2022   PCP:  Mancil Pfeiffer, PA-C Pharmacy:   [facility] [place], [place] - [id number] [place] [address] Phone: [phone] Fax: [phone]  [id number] - [place], [place] - [address] AT [facility] [address] [place] [address] Phone: [phone] Fax: [phone]     Social Drivers of Health (SDOH) Social History: SDOH Screenings   Food Insecurity: No Food Insecurity (05/18/2024)  Recent Concern: Food Insecurity - Food Insecurity Present (04/14/2024)  Housing: Low Risk  (05/18/2024)  Transportation Needs: No Transportation Needs (05/18/2024)  Utilities: Not At Risk (05/18/2024)  Alcohol Screen: Low Risk  (09/12/2022)  Depression (PHQ2-9): Low Risk  (05/06/2024)  Recent Concern: Depression (PHQ2-9) - Medium Risk (04/26/2024)  Financial Resource Strain: Low Risk  (04/14/2024)  Physical Activity: Inactive (02/21/2022)  Social Connections: Unknown (05/18/2024)  Stress: Stress Concern Present (02/21/2022)  Tobacco Use: High Risk (05/17/2024)   SDOH Interventions:     Readmission Risk Interventions    05/17/2024   12:24 PM 05/16/2024    9:57 AM 05/15/2024   11:43 AM  Readmission Risk Prevention Plan  Transportation Screening Complete Complete Complete  Home Care Screening Complete Complete Complete  Medication Review (RN CM) Complete Complete Complete

## 2024-05-18 NOTE — Care Management Obs Status (Signed)
 MEDICARE OBSERVATION STATUS NOTIFICATION   Patient Details  Name: Raven Lopez MRN: [id number] Date of Birth: 01-18-1956   Medicare Observation Status Notification Given:  Yes    Salathiel Ferrara L Lianna Sitzmann 05/18/2024, 3:25 PM

## 2024-05-19 ENCOUNTER — Telehealth: Payer: Self-pay

## 2024-05-19 DIAGNOSIS — G893 Neoplasm related pain (acute) (chronic): Secondary | ICD-10-CM | POA: Diagnosis not present

## 2024-05-19 DIAGNOSIS — M549 Dorsalgia, unspecified: Secondary | ICD-10-CM | POA: Diagnosis not present

## 2024-05-19 DIAGNOSIS — Z515 Encounter for palliative care: Secondary | ICD-10-CM | POA: Diagnosis not present

## 2024-05-19 MED ORDER — FLEET ENEMA RE ENEM: 1.0000 | ENEMA | Freq: Once | RECTAL | Status: DC

## 2024-05-19 MED ORDER — BISACODYL 10 MG RE SUPP
10.0000 mg | Freq: Every day | RECTAL | Status: DC | PRN
Start: 2024-05-19 — End: 2024-05-20
  Administered 2024-05-19: 10 mg via RECTAL
  Filled 2024-05-19: qty 1

## 2024-05-19 MED ORDER — OXYCODONE HCL 5 MG PO TABS: 5.0000 mg | ORAL_TABLET | Freq: Four times a day (QID) | ORAL | 0 refills | Status: AC | PRN

## 2024-05-19 MED ORDER — SENNA 8.6 MG PO TABS: 1.0000 | ORAL_TABLET | Freq: Every day | ORAL | 0 refills | Status: DC

## 2024-05-19 MED ORDER — MAGNESIUM CITRATE PO SOLN
1.0000 | Freq: Once | ORAL | Status: DC
  Filled 2024-05-19: qty 296

## 2024-05-19 MED ORDER — LINACLOTIDE 145 MCG PO CAPS: 145.0000 ug | ORAL_CAPSULE | Freq: Every day | ORAL | 0 refills | Status: DC

## 2024-05-19 MED ORDER — TIZANIDINE HCL 2 MG PO CAPS: 2.0000 mg | ORAL_CAPSULE | Freq: Three times a day (TID) | ORAL | 0 refills | Status: DC

## 2024-05-19 MED ORDER — TIZANIDINE HCL 4 MG PO TABS
4.0000 mg | ORAL_TABLET | Freq: Three times a day (TID) | ORAL | Status: DC
  Administered 2024-05-19 (×3): 4 mg via ORAL
  Filled 2024-05-19 (×3): qty 1

## 2024-05-19 MED ORDER — LIDOCAINE 5 % EX PTCH: 1.0000 | MEDICATED_PATCH | CUTANEOUS | 0 refills | Status: DC

## 2024-05-19 MED ORDER — FENTANYL 12 MCG/HR TD PT72: 1.0000 | MEDICATED_PATCH | TRANSDERMAL | 0 refills | Status: DC

## 2024-05-19 NOTE — Discharge Summary (Signed)
 Physician Discharge Summary   Patient: Raven Lopez MRN: [id number] DOB: [birth year]  Admit date:     05/17/2024  Discharge date: 05/19/24  Discharge Physician: Adriana DELENA Grams   PCP: Mancil Pfeiffer, [place]   Recommendations at discharge:   Follow-up with PCP in 1 week - Follow-up with palliative care, hospice at home - Follow up with interventional radiology as an outpatient for OsteoCool.  - Continue current pain management with current medication provided including fentanyl  patch, oxycodone , Lidoderm  patch, ibuprofen - Follow-up with oncologist as scheduled 05/27/24-for further discussion Continue course of antibiotic-Keflex for UTI  Discharge Diagnoses: Principal Problem:   Intractable back pain Active Problems:   Malignant neoplasm of urinary bladder (HCC)   Compression fracture of fifth lumbar vertebra (HCC)   Intractable back pain - Managed well with current pain management regimen, oxycodone , ibuprofen, Nitropatch, fentanyl    - Intractable back pain due to vertebral metastatic disease and pathologic compression fractures, no focal neurologic deficits... Denies any numbness or tingling in her lower extremities    - Family refusing SNF, patient deconditioned, unable to ambulate, multiple complaints, requesting palliative care services,  - Palliative consult has been placed -patient is to return home with home health     - Per discharge summary by Dr. Bryn - D/w IR Re: kyphoplasty. They recommend referral for outpatient discussion of OsteoCool (RFA) +/- KP. This has been done, and the patient will be contacted to schedule this appointment.    - PT/OT eval -recommended home health PT OT which has been arranged    Uncomplicated UTI complete Keflex   Metastatic bladder CA:  - Oncology consulted, appreciate their assessment. Will follow up 11/20 as planned. Pt contemplating another round of chemotherapy.    Oral candidiasis - Resume Diflucan , nystatin   Severe  protein-calorie malnutrition, cachexia, hypoalbuminemia, generalized weakness, failure to thrive: BMI is 15, very deconditioned.  -Ensure   -------------------------------------------------------------------------------------------------------------------------------------- Nutritional status:  The patient's BMI is: Body mass index is 15.36 kg/m. I agree with the assessment and plan as outlined  Nutrition Status: Nutrition Problem: Severe Malnutrition Etiology: chronic illness (metastatic bladder cancer) Signs/Symptoms: severe muscle depletion, severe fat depletion Interventions: Boost Breeze, Magic cup, MVI        Consultants: Palliative care Procedures performed: None Disposition: Home with palliative care/hospice Home health including hospital bed has been arranged Diet recommendation:  Discharge Diet Orders (From admission, onward)     Start     Ordered   05/19/24 0000  Diet - low sodium heart healthy        05/19/24 1053           Regular diet DISCHARGE MEDICATION: Allergies as of 05/19/2024       Reactions   Penicillins Shortness Of Breath, Swelling   Immediate rash, facial/tongue/throat swelling, SOB or lightheadedness with hypotension Pt says she can take keflex   Aspirin Other (See Comments)   Dizzy, weak, and upset stomach   Demerol [meperidine Hcl] Other (See Comments)   Altered mental status - anger, hallucinations   Sulfonamide Derivatives Nausea And Vomiting        Medication List     STOP taking these medications    cephALEXin 250 MG/5ML suspension Commonly known as: KEFLEX   diazepam  1 MG/ML solution Commonly known as: VALIUM    losartan 25 MG tablet Commonly known as: COZAAR       TAKE these medications    acetaminophen  500 MG tablet Commonly known as: TYLENOL  Take 1,000 mg by mouth every 6 (  six) hours as needed for mild pain (pain score 1-3) or moderate pain (pain score 4-6).   fentaNYL  12 MCG/HR Commonly known as:  DURAGESIC  Place 1 patch onto the skin every 3 (three) days for 21 days.   fluconazole  100 MG tablet Commonly known as: DIFLUCAN  Take 1 tablet (100 mg total) by mouth at bedtime.   ibuprofen 200 MG tablet Commonly known as: ADVIL Take 200 mg by mouth every 6 (six) hours as needed for mild pain (pain score 1-3).   lidocaine  5 % Commonly known as: Lidoderm  Place 1 patch onto the skin daily. Remove & Discard patch within 12 hours or as directed by MD   nicotine 14 mg/24hr patch Commonly known as: NICODERM CQ - dosed in mg/24 hours Place 1 patch (14 mg total) onto the skin daily.   nystatin 100000 UNIT/ML suspension Commonly known as: MYCOSTATIN Take 5 mLs (500,000 Units total) by mouth 4 (four) times daily for 5 days.   oxyCODONE  5 MG immediate release tablet Commonly known as: Oxy IR/ROXICODONE  Take 1 tablet (5 mg total) by mouth every 6 (six) hours as needed for up to 7 days for severe pain (pain score 7-10). What changed:  when to take this Another medication with the same name was removed. Continue taking this medication, and follow the directions you see here.   polyethylene glycol 17 g packet Commonly known as: MIRALAX / GLYCOLAX Take 17 g by mouth daily as needed for mild constipation.   senna 8.6 MG Tabs tablet Commonly known as: SENOKOT Take 1 tablet (8.6 mg total) by mouth at bedtime.   tizanidine 2 MG capsule Commonly known as: Zanaflex Take 1 capsule (2 mg total) by mouth 3 (three) times daily.               Durable Medical Equipment  (From admission, onward)           Start     Ordered   05/18/24 1345  For home use only DME Hospital bed  Once       Question Answer Comment  Length of Need 12 Months   Bed type Semi-electric      05/18/24 1344            Discharge Exam: Filed Weights   05/17/24 1722  Weight: 38.1 kg        General:  AAO x 3, awake alert, cooperative, chronically ill looking female, cachectic  HEENT:   Normocephalic, PERRL, otherwise with in Normal limits   Neuro:  CNII-XII intact. , normal motor and sensation, reflexes intact   Lungs:   Clear to auscultation BL, Respirations unlabored,  No wheezes / crackles  Cardio:    S1/S2, RRR, No murmure, No Rubs or Gallops   Abdomen:  Soft, non-tender, bowel sounds active all four quadrants, no guarding or peritoneal signs.  Muscular  skeletal:  Limited exam -global generalized weaknesses - in bed, able to move all 4 extremities,   2+ pulses,  symmetric, No pitting edema  Skin:  Dry, warm to touch, negative for any Rashes,  Wounds: Please see nursing documentation          Condition at discharge: fair  The results of significant diagnostics from this hospitalization (including imaging, microbiology, ancillary and laboratory) are listed below for reference.   Imaging Studies: CT CHEST ABDOMEN PELVIS W CONTRAST Result Date: 05/13/2024 CLINICAL DATA:  History of bladder cancer. Metastatic disease evaluation. EXAM: CT CHEST, ABDOMEN, AND PELVIS WITH CONTRAST TECHNIQUE: Multidetector CT imaging  of the chest, abdomen and pelvis was performed following the standard protocol during bolus administration of intravenous contrast. RADIATION DOSE REDUCTION: This exam was performed according to the departmental dose-optimization program which includes automated exposure control, adjustment of the mA and/or kV according to patient size and/or use of iterative reconstruction technique. CONTRAST:  OMNIPAQUE  IOHEXOL  300 MG/ML  SOLN COMPARISON:  CT dated 06/23/2023. FINDINGS: CT CHEST FINDINGS Cardiovascular: There is no cardiomegaly or pericardial effusion. There is coronary vascular calcification and calcification of the mitral annulus. Right-sided Port-A-Cath with tip in the right atrium. Moderate atherosclerotic calcification of the thoracic aorta. No aneurysmal dilatation or dissection. The central pulmonary arteries are patent. Mediastinum/Nodes: No hilar  adenopathy. Subcarinal adenopathy measures 2.2 cm in short axis new since the prior CT. The esophagus is grossly unremarkable. No mediastinal fluid collection. Lungs/Pleura: A 4 mm nodule in the left lower lobe (120/7) and new since the prior CT. A cluster of nodular density in the anterior left lung base (114/7) may be inflammatory or infectious in etiology. A 6 mm left upper lobe nodule (45/7) present on the prior CT. No consolidative changes. Trace right pleural effusion. No pneumothorax. The central airways are patent. Musculoskeletal: Osteopenia with degenerative changes of the spine. Old T8 and T9 compression fractures. No acute osseous pathology. CT ABDOMEN PELVIS FINDINGS No intra-abdominal free air.  Small ascites. Hepatobiliary: Large hypoenhancing masses throughout the liver consistent with metastatic disease and new since the prior CT. The gallbladder is distended. There is dilatation of the common bile duct measuring up to 17 mm in caliber. No stone noted in the gallbladder or in the bowel duct. Pancreas: A 1.4 x 1.1 cm hypoenhancing mass in the proximal pancreas may represent metastatic disease versus primary pancreatic adenocarcinoma. There is mild dilatation of the main pancreatic duct. Spleen: Normal in size without focal abnormality. Adrenals/Urinary Tract: The adrenal glands unremarkable. Right renal inferior pole cyst. There is no hydronephrosis on either side there is symmetric enhancement and excretion of contrast by both kidneys. The urinary bladder is minimally distended. Trabeculated versus thickened right bladder dome. Stomach/Bowel: Moderate stool throughout the colon. No bowel obstruction. No evidence of acute appendicitis. Vascular/Lymphatic: Advanced aortoiliac atherosclerotic disease. The IVC is unremarkable. No portal venous gas. Retroperitoneal adenopathy measure up to 2 cm in short axis posterior to the IVC and 16 mm short axis posterior to the main portal vein. Right iliac chain or  pelvic sidewall adenopathy/implant measures 2.4 x 2.0 cm (93/4). Additional left iliac chain and retroperitoneal adenopathy. Reproductive: The uterus is grossly unremarkable. Other: Diffuse subcutaneous edema. Musculoskeletal: Osteopenia with degenerative changes of the spine. Mixed density, predominantly sclerotic lesion involving the L5 with pathologic fracture and approximately 50% loss of vertebral body height. There is buckling of the posterior cortex with approximately 5 mm retropulsion and focal narrowing of the central canal. Extension of the infiltrative osseous mass/metastasis into the left L5 pedicle and probable extension into the left L5-S1 neural foramina. IMPRESSION: 1. Interval development of metastatic disease in the chest, abdomen, and pelvis as above. 2. A 1.4 x 1.1 cm hypoenhancing mass in the proximal pancreas may represent metastatic disease versus primary pancreatic adenocarcinoma. 3. Metastatic osseous disease involving L5 with associated pathologic fracture and posterior buckling and focal narrowing of the central canal. Aortic Atherosclerosis (ICD10-I70.0). Electronically Signed   By: Vanetta Chou M.D.   On: 05/13/2024 19:02   MR Lumbar Spine W Wo Contrast Result Date: 05/13/2024 EXAM: MRI LUMBAR SPINE 05/13/2024 05:59:24 PM TECHNIQUE: Multiplanar multisequence  MRI of the lumbar spine was performed with and without the administration of intravenous contrast. 4 mL of gadobutrol (GADAVIST) 1 MMOL/ML injection was administered. COMPARISON: CT of the lumbar spine 04/16/2024. CLINICAL HISTORY: Low back pain. Clinical history is abnormal CT of the lumbar spine and low back pain. FINDINGS: BONES AND ALIGNMENT: Normal alignment. Normal vertebral body heights *except for the compression fracture at L5 and superior endplate fracture at L3*. Superior endplate Schmorl nodes are present at T12, L1, and L2. SPINAL CORD: The conus terminates normally. SOFT TISSUES: No paraspinal mass. Enlarged  paraaortic lymph nodes measure up to 20 mm on the right and 21 mm on the left. Dilated small bowel present in the right upper quadrant. Atherosclerotic changes are present in the abdominal aorta; largest transverse diameter is 2.8 cm. T12-L1: Superior endplate Schmorl node at T12. No significant disc herniation. No spinal canal stenosis or neural foraminal narrowing. L1-L2: Superior endplate Schmorl node at L1. No significant disc herniation. No spinal canal stenosis or neural foraminal narrowing. L2-L3: Superior endplate Schmorl node at L2. No significant disc herniation. No spinal canal stenosis or neural foraminal narrowing. L3-L4: Superior endplate fracture at L3 is similar to the prior exam. An enhancing mass lesion in the left anterior L3 vertebral body is more obvious than on the prior study, measuring 18 x 13 mm on sagittal images. An enhancing lesion in the right posterolateral L4 vertebral body extends into the pedicle without fracture. A 12 mm lesion is present in the anterior inferior L4 vertebral body. No significant disc herniation. No spinal canal stenosis or neural foraminal narrowing. L4-L5: Diffuse marrow signal abnormality and enhancement is present within the L5 vertebral body. A compression fracture has progressed with minimal height now measuring 12 mm compared to 14 mm previously. Compressed bone extends posteriorly into the spinal canal. Abnormal signal and enhancement extends into the pedicles, left greater than right. No significant disc herniation. No spinal canal stenosis or neural foraminal narrowing. L5-S1: No significant disc herniation. No spinal canal stenosis or neural foraminal narrowing. IMPRESSION: 1. Multiple osseous lesions consistent with metastatic disease. 2. Progression of L5 compression fracture with minimal additional height loss, with abnormal signal and enhancement extending into the pedicles and bone retropulsed posteriorly into the spinal canal. 3. Enhancing lesions  at L4 involving the right posterolateral vertebral body and pedicle and a separate 12 mm lesion in the anterior inferior L4 vertebral body. 4. New L3 superior endplate fracture with an enhancing mass in the left anterior L3 vertebral body measuring 18 x 13 mm. 5. Enlarged paraaortic lymph nodes measuring up to 21 mm, also consistent with metastatic disease these have increased in size since the prior study. Electronically signed by: Lonni Necessary MD 05/13/2024 06:25 PM EST RP Workstation: [id number]    Microbiology: Results for orders placed or performed in visit on 03/17/24  Microscopic Examination     Status: Abnormal   Collection Time: 03/17/24  2:55 PM   Urine  Result Value Ref Range Status   WBC, UA 0-5 0 - 5 /hpf Final   RBC, Urine 0-2 0 - 2 /hpf Final   Epithelial Cells (non renal) >10 (A) 0 - 10 /hpf Final   Bacteria, UA None seen None seen/Few Final    Labs: CBC: Recent Labs  Lab 05/13/24 1628 05/14/24 0348 05/15/24 0431 05/17/24 2113  WBC 15.3* 14.6* 15.2* 14.7*  NEUTROABS 13.7* 11.7* 12.5*  --   HGB 10.5* 9.4* 9.4* 11.2*  HCT 32.7* 30.1* 29.8* 35.5*  MCV  87.7 88.5 87.9 88.8  PLT 366 346 353 317   Basic Metabolic Panel: Recent Labs  Lab 05/13/24 1604 05/14/24 0348 05/15/24 0431 05/17/24 2113  NA 134* 133* 135 134*  K 5.1 4.4 4.7 4.5  CL 99 101 101 99  CO2 24 22 25 24   GLUCOSE 93 98 108* 122*  BUN 20 17 14 17   CREATININE 1.05* 0.90 0.88 1.03*  CALCIUM 8.5* 8.0* 8.3* 8.4*  MG  --  2.0  --   --   PHOS  --  3.1  --   --    Liver Function Tests: Recent Labs  Lab 05/13/24 1604 05/14/24 0348 05/15/24 0431  AST 43* 40 38  ALT 23 21 20   ALKPHOS 297* 297* 317*  BILITOT 1.2 0.8 0.8  PROT 6.5 5.8* 5.8*  ALBUMIN 3.0* 2.7* 2.7*   CBG: No results for input(s): GLUCAP in the last 168 hours.  Discharge time spent: greater than 30 minutes.  Signed: Adriana DELENA Grams, MD [facility] 05/19/2024

## 2024-05-19 NOTE — Plan of Care (Signed)
  Problem: Health Behavior/Discharge Planning: Goal: Ability to manage health-related needs will improve Outcome: Progressing   Problem: Clinical Measurements: Goal: Will remain free from infection Outcome: Progressing   Problem: Nutrition: Goal: Adequate nutrition will be maintained Outcome: Progressing   Problem: Elimination: Goal: Will not experience complications related to bowel motility Outcome: Progressing   Problem: Pain Managment: Goal: General experience of comfort will improve and/or be controlled Outcome: Progressing   Problem: Skin Integrity: Goal: Risk for impaired skin integrity will decrease Outcome: Progressing

## 2024-05-19 NOTE — TOC Transition Note (Addendum)
 Transition of Care [facility]) - Discharge Note   Patient Details  Name: Raven Lopez MRN: [id number] Date of Birth: 10/06/1955  Transition of Care [facility]) CM/SW Contact:  Lucie Lunger, LCSWA Phone Number: 05/19/2024, 3:36 PM   Clinical Narrative:    CSW updated that pt is medically stable for D/C home today. CSW spoke with pt and son at bedside, they are agreeable. [facility] PT/RN services have been arranged by [facility], CSW updated Darleene with [facility] of plan for D/C home today, orders placed by MD. Hospital bed ordered by [facility], they will deliver to pts home. OP palliative referral made to [facility], they will follow pt in the community. TOC signing off.   Final next level of care: Home w Home Health Services Barriers to Discharge: Barriers Resolved   Patient Goals and CMS Choice Patient states their goals for this hospitalization and ongoing recovery are:: return home CMS Medicare.gov Compare Post Acute Care list provided to:: Patient Choice offered to / list presented to : Adult Children, Patient      Discharge Placement                  Name of family member notified: Jordan    Discharge Plan and Services Additional resources added to the After Visit Summary for   In-house Referral: Clinical Social Work Discharge Planning Services: CM Consult Post Acute Care Choice: Durable Medical Equipment          DME Arranged: Hospital bed DME Agency: [facility] Date DME Agency Contacted: 05/19/24   Representative spoke with at DME Agency: [facility] HH Arranged: PT, RN HH Agency: [facility] Date [facility] Agency Contacted: 05/19/24   Representative spoke with at [facility] Agency: Darleene  Social Drivers of Health (SDOH) Interventions SDOH Screenings   Food Insecurity: No Food Insecurity (05/18/2024)  Recent Concern: Food Insecurity - Food Insecurity Present (04/14/2024)  Housing: Low Risk  (05/18/2024)  Transportation Needs: No Transportation Needs (05/18/2024)  Utilities: Not At Risk  (05/18/2024)  Alcohol Screen: Low Risk  (09/12/2022)  Depression (PHQ2-9): Low Risk  (05/06/2024)  Recent Concern: Depression (PHQ2-9) - Medium Risk (04/26/2024)  Financial Resource Strain: Low Risk  (04/14/2024)  Physical Activity: Inactive (02/21/2022)  Social Connections: Unknown (05/18/2024)  Stress: Stress Concern Present (02/21/2022)  Tobacco Use: High Risk (05/17/2024)     Readmission Risk Interventions    05/17/2024   12:24 PM 05/16/2024    9:57 AM 05/15/2024   11:43 AM  Readmission Risk Prevention Plan  Transportation Screening Complete Complete Complete  Home Care Screening Complete Complete Complete  Medication Review (RN CM) Complete Complete Complete

## 2024-05-19 NOTE — Progress Notes (Signed)
 Daily Progress Note   Patient Name: Raven Lopez       Date: 05/19/2024 DOB: February 06, 1956  Age: 68 y.o. MRN#: [id number] Attending Physician: Willette Adriana LABOR, MD Primary Care Physician: Mancil Pfeiffer, [place] Admit Date: 05/17/2024  Reason for Consultation/Follow-up: Establishing goals of care  Patient Profile/HPI:  68 y.o. female  with past medical history of bladder cancer- s/p TURBT, radiation and chemotherapy- was on surveillance, had recent admission with back pain and found to have mets to spine, liver, and pancreas vs new primary- admitted on 05/17/2024 after going to SNF and having a panic attack. Palliative medicine consulted for pain management.    Subjective: Chart reviewed including labs, progress notes, imaging from this and previous encounters.  Patient is discharging home today. Her son was at bedside.  We discussed her pain management regimen. Patient reports good control with current pain medications in place.  No questions or concerns.   ROS   Physical Exam Vitals and nursing note reviewed.  Constitutional:      Appearance: She is ill-appearing.  Pulmonary:     Effort: Pulmonary effort is normal.  Neurological:     Mental Status: She is alert and oriented to person, place, and time.             Vital Signs: BP (!) 110/58 (BP Location: Right Arm)   Pulse 64   Temp 98.1 F (36.7 C) (Oral)   Resp 16   Ht 5' 2 (1.575 m)   Wt 38.1 kg   SpO2 99%   BMI 15.36 kg/m  SpO2: SpO2: 99 % O2 Device: O2 Device: Room Air O2 Flow Rate:    Intake/output summary:  Intake/Output Summary (Last 24 hours) at 05/19/2024 1559 Last data filed at 05/19/2024 0846 Gross per 24 hour  Intake 480 ml  Output 150 ml  Net 330 ml   LBM: Last BM Date : 05/12/24 Baseline Weight:  Weight: 38.1 kg Most recent weight: Weight: 38.1 kg       Palliative Assessment/Data: PPS: 40%      Patient Active Problem List   Diagnosis Date Noted   Intractable back pain 05/17/2024   Encounter for palliative care 05/15/2024   Back pain 05/13/2024   Compression fracture of fifth lumbar vertebra (HCC) 04/26/2024   Cachexia 04/05/2024   Difficulty with activities  of daily living 04/05/2024   Physical deconditioning 04/05/2024   Chronic midline low back pain without sciatica 04/05/2024   Hypotension due to drugs 04/05/2024   Malignant neoplasm of urinary bladder (HCC) 07/29/2022    Palliative Care Assessment & Plan    Assessment/Recommendations/Plan  Continue current pain regimen Plan to d/c home with Palliative   Code Status:   Code Status: Full Code   Prognosis:  Unable to determine  Discharge Planning: Home with Palliative Services  Care plan was discussed with patient and son  Thank you for allowing the Palliative Medicine Team to assist in the care of this patient.  Total time:  Prolonged billing:  Time includes:   Preparing to see the patient (e.g., review of tests) Obtaining and/or reviewing separately obtained history Performing a medically necessary appropriate examination and/or evaluation Counseling and educating the patient/family/caregiver Ordering medications, tests, or procedures Referring and communicating with other health care professionals (when not reported separately) Documenting clinical information in the electronic or other health record Independently interpreting results (not reported separately) and communicating results to the patient/family/caregiver Care coordination (not reported separately) Clinical documentation  Cassondra Stain, AGNP-C Palliative Medicine   Please contact Palliative Medicine Team phone at [phone] for questions and concerns.

## 2024-05-19 NOTE — Plan of Care (Signed)

## 2024-05-19 NOTE — Telephone Encounter (Signed)
 Copied from CRM [id number]. Topic: General - Other >> May 19, 2024  4:04 PM Shanda MATSU wrote: Reason for CRM: [facility] w/[facility] CB [phone], called in to adv that they will be following patient for palliative care.

## 2024-05-20 ENCOUNTER — Telehealth: Payer: Self-pay | Admitting: *Deleted

## 2024-05-20 ENCOUNTER — Other Ambulatory Visit: Payer: Self-pay | Admitting: *Deleted

## 2024-05-20 ENCOUNTER — Encounter: Payer: Self-pay | Admitting: *Deleted

## 2024-05-20 NOTE — Patient Outreach (Addendum)
  Care Management   Follow Up Note   05/20/2024 Name: Raven Lopez MRN: [id number] DOB: 05/11/1956   Referred by: Mancil Pfeiffer, PA-C Reason for referral : Complex Care Management (RN Care Manager)   Obtained permission from patient to call son, Jordan Middendorf. Placed call to patient son Re: patient care needs/safety. He is open to in-home care services to be available to patient since he is not available around the clock. BSW is scheduled to follow up with patient/son on 05-21-24. Patient expressed she would consider short term rehab at [facility]. RNCM to collaborate with BSW to see if this placement is an option.   Rosina Forte, BSN RN [facility], [facility] RN Care Manager Direct Dial: [phone]  Fax: [phone]

## 2024-05-20 NOTE — Patient Instructions (Signed)
 Visit Information  Thank you for taking time to visit with me today. Please don't hesitate to contact me if I can be of assistance to you before our next scheduled appointment.  Your next care management appointment is by telephone on 06/08/24 at 9:00am  Telephone follow-up 19 days  Please call the care guide team at [phone] if you need to cancel, schedule, or reschedule an appointment.   Please call the Suicide and Crisis Lifeline: 988 call the [place]  National Suicide Prevention Lifeline: 986-563-4790 or TTY: 913-249-1790 TTY 680-492-9703) to talk to a trained counselor call 1-800-273-TALK (toll free, 24 hour hotline) if you are experiencing a Mental Health or Behavioral Health Crisis or need someone to talk to.  Gaila Engebretsen RN RN Care Manager [facility] [phone]

## 2024-05-20 NOTE — Patient Outreach (Addendum)
 Complex Care Management   Visit Note  05/20/2024  Name:  RITHA SAMPEDRO MRN: [id number] DOB: Jul 30, 1955  Situation: Referral received for Complex Care Management related to pain I obtained verbal consent from Patient.  Visit completed with Patient  on the phone  Background:   Past Medical History:  Diagnosis Date   AC (acromioclavicular) joint bone spurs    Anxiety    Asthma    COPD (chronic obstructive pulmonary disease) (HCC)    DDD (degenerative disc disease), lumbar    Hypertension    Neck fracture (HCC)    Panic disorder    Scoliosis     Assessment:  The patient was recently discharged from the hospital to [facility] ([facility]), but subsequently returned to the Emergency Department and was re-admitted. She is now discharged home with palliative care services provided by [facility] and home health services through [place], including physical therapy and skilled nursing. The patient reports that she essentially lives alone. Her son resides in a guest house on the same property, and her sister lives across the street. She confirms that she is bedbound and fully dependent for all activities of daily living (ADLs). The RN Care Manager [facility]) has expressed concerns regarding the patient's safety due to the absence of 24-hour in-home care, particularly in the event of emergencies or for assistance with daily needs such as meals and hygiene. A meeting is scheduled with the Social Worker to further assess the patient's situation. The RNCM will inquire about eligibility for additional in-home support services. The patient has indicated she remains open to SNF placement and has expressed interest in [facility] for short-term rehabilitation as a potential option. She also shared feelings of anxiety about being home alone and noted she is preparing for the next phase of her cancer treatment.  Patient Reported Symptoms:  Cognitive Cognitive Status: Able to follow simple  commands, Alert and oriented to person, place, and time Cognitive/Intellectual Conditions Management [RPT]: None reported or documented in medical history or problem list   Health Maintenance Behaviors: Spiritual practice(s), Annual physical exam Healing Pattern: Unsure  Neurological Neurological Review of Symptoms: No symptoms reported Neurological Management Strategies: Routine screening Neurological Self-Management Outcome: 4 (good)  HEENT HEENT Symptoms Reported: No symptoms reported HEENT Management Strategies: Coping strategies, Adequate rest HEENT Self-Management Outcome: 3 (uncertain)    Cardiovascular Cardiovascular Symptoms Reported: No symptoms reported Does patient have uncontrolled Hypertension?: No Cardiovascular Management Strategies: Routine screening Cardiovascular Self-Management Outcome: 4 (good)  Respiratory Respiratory Symptoms Reported: No symptoms reported Respiratory Management Strategies: Routine screening Respiratory Self-Management Outcome: 4 (good)  Endocrine Endocrine Symptoms Reported: Not assessed    Gastrointestinal Gastrointestinal Symptoms Reported: Constipation Gastrointestinal Management Strategies: Incontinence garment/pad Gastrointestinal Self-Management Outcome: 3 (uncertain)    Genitourinary Genitourinary Symptoms Reported: No symptoms reported Additional Genitourinary Details: using purewick Genitourinary Self-Management Outcome: 3 (uncertain)  Integumentary Integumentary Symptoms Reported: Bruising Additional Integumentary Details: due to venipunctures Skin Management Strategies: Routine screening Skin Self-Management Outcome: 3 (uncertain)  Musculoskeletal Musculoskelatal Symptoms Reviewed: Back pain, Weakness Musculoskeletal Management Strategies: Routine screening Musculoskeletal Self-Management Outcome: 3 (uncertain)      Psychosocial Psychosocial Symptoms Reported: Anxiety - if selected complete GAD Behavioral Management Strategies:  Coping strategies, Support system, Medication therapy Behavioral Health Self-Management Outcome: 3 (uncertain) Major Change/Loss/Stressor/Fears (CP): Medical condition, self Techniques to Cope with Loss/Stress/Change: Spiritual practice(s) Quality of Family Relationships: helpful, supportive    05/20/2024    PHQ2-9 Depression Screening   Little interest or pleasure in doing things Not at  all  Feeling down, depressed, or hopeless Not at all  PHQ-2 - Total Score 0  Trouble falling or staying asleep, or sleeping too much    Feeling tired or having little energy    Poor appetite or overeating     Feeling bad about yourself - or that you are a failure or have let yourself or your family down    Trouble concentrating on things, such as reading the newspaper or watching television    Moving or speaking so slowly that other people could have noticed.  Or the opposite - being so fidgety or restless that you have been moving around a lot more than usual    Thoughts that you would be better off dead, or hurting yourself in some way    PHQ2-9 Total Score    If you checked off any problems, how difficult have these problems made it for you to do your work, take care of things at home, or get along with other people    Depression Interventions/Treatment      There were no vitals filed for this visit. Pain Scale: 0-10 Pain Score: 8  Pain Type: Chronic pain Pain Location: Back Pain Intervention(s): Medication (See eMAR)  Medications Reviewed Today     Reviewed by Nivia Patient , RN (Registered Nurse) on 05/20/24 at [phone]  Med List Status: <None>   Medication Order Taking? Sig Documenting Provider Last Dose Status Informant  acetaminophen  (TYLENOL ) 500 MG tablet [id number] Yes Take 1,000 mg by mouth every 6 (six) hours as needed for mild pain (pain score 1-3) or moderate pain (pain score 4-6). [provider]  Active Child, Pharmacy Records, Self           Med Note SALBADOR, MACI D   Fri  May 14, 2024  1:12 PM) Pt likes combo pill w/ motrin (advil)  fentaNYL  (DURAGESIC ) 12 MCG/HR [id number] Yes Place 1 patch onto the skin every 3 (three) days for 21 days. Willette Adriana LABOR, MD  Active   fluconazole  (DIFLUCAN ) 100 MG tablet [id number] Yes Take 1 tablet (100 mg total) by mouth at bedtime. Bryn Bernardino NOVAK, MD  Active Child, Pharmacy Records, Self           Med Note [place], CHUCK KANDICE Kitchens May 17, 2024  9:58 PM) Pt has not taken at home   ibuprofen (ADVIL) 200 MG tablet [id number]  Take 200 mg by mouth every 6 (six) hours as needed for mild pain (pain score 1-3).  Patient not taking: Reported on 05/20/2024   [provider]  Active Child, Pharmacy Records, Self           Med Note SALBADOR, MACI D   Fri May 14, 2024  1:11 PM) Pt likes combo pill w/ acetaminophen    lidocaine  (LIDODERM ) 5 % [id number]  Place 1 patch onto the skin daily. Remove & Discard patch within 12 hours or as directed by MD  Patient not taking: Reported on 05/20/2024   Shahmehdi, Seyed A, MD  Active   linaclotide [facility]) 145 MCG CAPS capsule [id number]  Take 1 capsule (145 mcg total) by mouth daily before breakfast.  Patient not taking: Reported on 05/20/2024   Shahmehdi, Seyed A, MD  Active   nicotine (NICODERM CQ - DOSED IN MG/24 HOURS) 14 mg/24hr patch [id number]  Place 1 patch (14 mg total) onto the skin daily.  Patient not taking: Reported on 05/20/2024   Bryn Bernardino NOVAK, MD  Active Child, Pharmacy Records, Self  Med Note (WARD, ANGELICA G   Mon May 17, 2024  9:58 PM) Pt has not taken at home   nystatin (MYCOSTATIN) 100000 UNIT/ML suspension [id number]  Take 5 mLs (500,000 Units total) by mouth 4 (four) times daily for 5 days. Bryn Bernardino NOVAK, MD  Active Child, Pharmacy Records, Self           Med Note [place], CHUCK KANDICE Kitchens May 17, 2024  9:58 PM) Pt has not taken at home   oxyCODONE  (OXY IR/ROXICODONE ) 5 MG immediate release tablet [id number] Yes Take 1 tablet (5 mg total) by mouth every 6 (six)  hours as needed for up to 7 days for severe pain (pain score 7-10). Shahmehdi, Adriana LABOR, MD  Active   polyethylene glycol (MIRALAX / GLYCOLAX) 17 g packet [id number] Yes Take 17 g by mouth daily as needed for mild constipation. Bryn Bernardino NOVAK, MD  Active Child, Pharmacy Records, Self           Med Note [place], CHUCK KANDICE Kitchens May 17, 2024  9:58 PM) Pt has not taken at home   senna (SENOKOT) 8.6 MG TABS tablet [id number] Yes Take 1 tablet (8.6 mg total) by mouth at bedtime. Willette Adriana LABOR, MD  Active   tizanidine (ZANAFLEX) 2 MG capsule [id number] Yes Take 1 capsule (2 mg total) by mouth 3 (three) times daily. Willette Adriana LABOR, MD  Active             Recommendation:   Continue Current Plan of Care Consider SNF placement vs in-home caregiver with 24hr supervision  Follow Up Plan:   Telephone follow-up 19 days Leilanee Righetti RN RN Care Manager [facility] [phone]

## 2024-05-21 ENCOUNTER — Other Ambulatory Visit: Payer: Self-pay

## 2024-05-21 ENCOUNTER — Telehealth: Payer: Self-pay | Admitting: *Deleted

## 2024-05-21 NOTE — Telephone Encounter (Signed)
 Copied from CRM [id number]. Topic: Clinical - Home Health Verbal Orders >> May 20, 2024  1:02 PM Antwanette L wrote: Jasmine from [facility] called to inform the provider that the patient's son is requesting to delay the start of physical therapy and nursing services. The new start date for these services is scheduled for 05/22/2024. Jasmine can be contacted at [phone]

## 2024-05-21 NOTE — Patient Outreach (Signed)
 Social Drivers of Health  Community Resource and Care Coordination Visit Note   05/21/2024  Name: Raven Lopez MRN: [id number] DOB:06-11-1956  Situation: Referral received for [facility] needs assessment and assistance related to Level of Care. I obtained verbal consent from Patient.  Visit completed with Son, Jordan Minney on the phone.   Background:      Assessment:   Goals Addressed             This Visit's Progress    BSW Goals       Current SDOH Barriers:  Level of care concerns Hospital Bed  Interventions: Patient interviewed and appropriate screenings performed Referred patient to community resources  Provided patient with information about Home Health agencies, insurance does not pay for Home Health, [facility] may provide meals after hospital stay. Advised patient to medical provider regarding Hospice/Palliative Care. Assisted patient/caregiver with obtaining information about health plan benefits Hedda comes bi-weekly for vitals and [facility] provider telephone follow ups because patient is out of their service area.  Son stays during the day and sister stays at night.  Son will speak to fiances mother to assist with personal care.  SW emailed son list of Home Health agencies.   SW conference with LCSW M. Rumbley and RN A. Con regarding the status of the hospital bed. SW also inquired if patient is a candidate for Hospice. Son will follow up with [facility] on the status of the waiting list for In Home Aide and suspend Meals on Wheels as patient is not eating the meals.  Son is able to purchase food that patient wants to eat.          Recommendation:   Patients son will contact [facility], Home Health agencies, await delivery of hospital bed and speak to provider regarding Hospice.  Follow Up Plan:   Telephone follow up appointment date/time:  05/31/24 at 1pm  Tillman Gardener, BSW [place]  [facility], [facility] Social Worker Direct Dial: [phone]   Fax: [phone] Website: [email or web address]

## 2024-05-21 NOTE — Patient Instructions (Signed)
 Visit Information  Thank you for taking time to visit with me today. Please don't hesitate to contact me if I can be of assistance to you before our next scheduled appointment.  Your next care management appointment is by telephone on 05/31/24 at 1pm   Please call the care guide team at [phone] if you need to cancel, schedule, or reschedule an appointment.   Please call 911 if you are experiencing a Mental Health or Behavioral Health Crisis or need someone to talk to.  Tillman Gardener, BSW [place]  [facility], [facility] Social Worker Direct Dial: [phone]  Fax: [phone] Website: [email or web address]

## 2024-05-24 ENCOUNTER — Telehealth: Payer: Self-pay | Admitting: *Deleted

## 2024-05-24 ENCOUNTER — Ambulatory Visit: Admitting: Physician Assistant

## 2024-05-24 NOTE — Telephone Encounter (Unsigned)
 Copied from CRM #[id number]. Topic: Clinical - Home Health Verbal Orders >> May 24, 2024  8:28 AM Laymon HERO wrote: Caller/Agency: [facility] Callback Number: [phone] Service Requested: Skilled Nursing Frequency: 1x4 - 1x every other week 4 weeks.  Any new concerns about the patient? Yes- She has a Stage 2 pressure ulcer to her right buttock- they will put Desitin on the area. There is possible undiagnosed depression. PHQ-9=15 . She does choose to continue smoking. Wanting to know if patient is still taking diflucan , as it interacts with her pain medications.

## 2024-05-25 ENCOUNTER — Telehealth: Payer: Self-pay | Admitting: *Deleted

## 2024-05-25 NOTE — Telephone Encounter (Signed)
 Raven Lopez informed and verbalized understanding.

## 2024-05-25 NOTE — Telephone Encounter (Signed)
 Discussion had with son and reiterating that her disease is very advanced and chemotherapy would not be a curative option. Patient is currently bed bound and would have to utilize EMS for appointments.  Made him aware that we would still be glad to see her on 11/20 if they preferred to do so.  He opted to cancel appointment tomorrow and follow up with Rad Onc for palliative radiation for pain control.  [facility] is evaluating patient for home Hospice care as well.

## 2024-05-27 ENCOUNTER — Inpatient Hospital Stay: Admitting: Oncology

## 2024-05-28 ENCOUNTER — Ambulatory Visit
Admission: RE | Admit: 2024-05-28 | Discharge: 2024-05-28 | Disposition: A | Discharge status: Home / Self Care | Source: Ambulatory Visit | Attending: Physician Assistant | Admitting: Physician Assistant

## 2024-05-28 ENCOUNTER — Other Ambulatory Visit: Payer: Self-pay | Admitting: Interventional Radiology

## 2024-05-28 DIAGNOSIS — C7951 Secondary malignant neoplasm of bone: Secondary | ICD-10-CM

## 2024-05-28 DIAGNOSIS — S32050A Wedge compression fracture of fifth lumbar vertebra, initial encounter for closed fracture: Secondary | ICD-10-CM

## 2024-05-28 DIAGNOSIS — C799 Secondary malignant neoplasm of unspecified site: Secondary | ICD-10-CM

## 2024-05-28 NOTE — Consult Note (Signed)
 Chief Complaint: Patient was seen in consultation today for painful pathologic lumbar compression fracture at the request of Watterson,Shannon A  Referring Physician(s): Bernardino KATHEE Come   History of Present Illness: Raven Lopez is a 68 y.o. female history of bladder CA s/p TURBT Feb 2024, declined cystectomy, s/p chemotherapy, XRT April-May 2024 lost to follow up, 1ppd tobacco use, anxiety, recent Dx L5 compression fracture who presented to the ED on 05/13/2024 with a couple days to a week of worsening severe lower back pain. She was found to have multiple metastatic lesions that have progressed including bony metastatic disease. Subsequently, she was admitted for intractable pain and debility. Oncology was consulted. Opioid analgesics have been titrated with good effect, and rehabilitation is being pursued. Referred for discussion of possible kyphoplasty/osteocool.   Past Medical History:  Diagnosis Date   AC (acromioclavicular) joint bone spurs    Anxiety    Asthma    COPD (chronic obstructive pulmonary disease) (HCC)    DDD (degenerative disc disease), lumbar    Hypertension    Neck fracture (HCC)    Panic disorder    Scoliosis     Past Surgical History:  Procedure Laterality Date   BLADDER INSTILLATION N/A 08/29/2022   Procedure: BLADDER INSTILLATION;  Surgeon: Sherrilee Belvie CROME, MD;  Location: [facility];  Service: Urology;  Laterality: N/A;   CESAREAN SECTION     CYSTOSCOPY W/ RETROGRADES Bilateral 08/29/2022   Procedure: CYSTOSCOPY WITH RETROGRADE PYELOGRAM;  Surgeon: Sherrilee Belvie CROME, MD;  Location: [facility];  Service: Urology;  Laterality: Bilateral;   IR IMAGING GUIDED PORT INSERTION  10/17/2022   TRANSURETHRAL RESECTION OF BLADDER TUMOR N/A 08/29/2022   Procedure: TRANSURETHRAL RESECTION OF BLADDER TUMOR (TURBT);  Surgeon: Sherrilee Belvie CROME, MD;  Location: [facility];  Service: Urology;  Laterality: N/A;   wisdom teeth removal      Allergies: Penicillins, Aspirin,  Demerol [meperidine hcl], and Sulfonamide derivatives  Medications: Prior to Admission medications   Medication Sig Start Date End Date Taking? Authorizing Provider  acetaminophen  (TYLENOL ) 500 MG tablet Take 1,000 mg by mouth every 6 (six) hours as needed for mild pain (pain score 1-3) or moderate pain (pain score 4-6).    [provider]  fentaNYL  (DURAGESIC ) 12 MCG/HR Place 1 patch onto the skin every 3 (three) days for 21 days. 05/19/24 06/09/24  Willette Adriana LABOR, MD  fluconazole  (DIFLUCAN ) 100 MG tablet Take 1 tablet (100 mg total) by mouth at bedtime. 05/17/24   Come Bernardino KATHEE, MD  ibuprofen (ADVIL) 200 MG tablet Take 200 mg by mouth every 6 (six) hours as needed for mild pain (pain score 1-3). Patient not taking: Reported on 05/20/2024    [provider]  lidocaine  (LIDODERM ) 5 % Place 1 patch onto the skin daily. Remove & Discard patch within 12 hours or as directed by MD Patient not taking: Reported on 05/20/2024 05/19/24   Willette Adriana LABOR, MD  linaclotide  (LINZESS ) 145 MCG CAPS capsule Take 1 capsule (145 mcg total) by mouth daily before breakfast. Patient not taking: Reported on 05/20/2024 05/19/24   Willette Adriana LABOR, MD  nicotine  (NICODERM CQ  - DOSED IN MG/24 HOURS) 14 mg/24hr patch Place 1 patch (14 mg total) onto the skin daily. Patient not taking: Reported on 05/20/2024 05/17/24   Come Bernardino KATHEE, MD  polyethylene glycol (MIRALAX  / GLYCOLAX ) 17 g packet Take 17 g by mouth daily as needed for mild constipation. 05/17/24   Come Bernardino KATHEE, MD  senna NALANI)  8.6 MG TABS tablet Take 1 tablet (8.6 mg total) by mouth at bedtime. 05/19/24   Shahmehdi, Adriana LABOR, MD  tizanidine  (ZANAFLEX ) 2 MG capsule Take 1 capsule (2 mg total) by mouth 3 (three) times daily. 05/19/24   Willette Adriana LABOR, MD     Family History  Problem Relation Age of Onset   Aneurysm Father    Kidney failure Mother    Hypertension Sister    Thyroid  disease Sister    Anxiety disorder Sister     Colitis Sister    Irritable bowel syndrome Sister     Social History   Socioeconomic History   Marital status: Widowed    Spouse name: Not on file   Number of children: Not on file   Years of education: Not on file   Highest education level: GED or equivalent  Occupational History   Not on file  Tobacco Use   Smoking status: Every Day    Current packs/day: 1.00    Average packs/day: 1 pack/day for 40.0 years (40.0 ttl pk-yrs)    Types: Cigarettes   Smokeless tobacco: Never  Vaping Use   Vaping status: Never Used  Substance and Sexual Activity   Alcohol  use: Not Currently   Drug use: Not Currently   Sexual activity: Not Currently    Birth control/protection: Post-menopausal  Other Topics Concern   Not on file  Social History Narrative   Not on file   Social Drivers of Health   Financial Resource Strain: Low Risk  (05/23/2024)   Overall Financial Resource Strain (CARDIA)    Difficulty of Paying Living Expenses: Not hard at all  Food Insecurity: No Food Insecurity (05/23/2024)   Hunger Vital Sign    Worried About Running Out of Food in the Last Year: Never true    Ran Out of Food in the Last Year: Never true  Recent Concern: Food Insecurity - Food Insecurity Present (04/14/2024)   Hunger Vital Sign    Worried About Running Out of Food in the Last Year: Never true    Ran Out of Food in the Last Year: Sometimes true  Transportation Needs: No Transportation Needs (05/23/2024)   PRAPARE - Administrator, Civil Service (Medical): No    Lack of Transportation (Non-Medical): No  Physical Activity: Inactive (05/23/2024)   Exercise Vital Sign    Days of Exercise per Week: 0 days    Minutes of Exercise per Session: Not on file  Stress: No Stress Concern Present (05/23/2024)   [employer] of Occupational Health - Occupational Stress Questionnaire    Feeling of Stress: Only a little  Social Connections: Socially Isolated (05/23/2024)   Social Connection and  Isolation Panel    Frequency of Communication with Friends and Family: More than three times a week    Frequency of Social Gatherings with Friends and Family: More than three times a week    Attends Religious Services: Never    Database Administrator or Organizations: No    Attends Banker Meetings: Not on file    Marital Status: Widowed    ECOG Status: 3 - Symptomatic, >50% confined to bed  Review of Systems: A 12 point ROS discussed and pertinent positives are indicated in the HPI above.  All other systems are negative.  Review of Systems  Vital Signs: BP 116/68 (BP Location: Left Arm, Patient Position: Sitting, Cuff Size: Normal)   Pulse (!) 102   Temp 98.1 F (36.7 C) (Oral)  Resp 16   SpO2 93%   Advance Care Plan: The advanced care plan/surrogate decision maker was discussed at the time of visit and documented in the medical record.    Physical Exam Constitutional: Oriented to person, place, and time. Thin, pale, ill appearing.  No distress.   HENT:  Head: Normocephalic and atraumatic.  Eyes: Conjunctivae and EOM are normal. Right eye exhibits no discharge. Left eye exhibits no discharge. No scleral icterus.  Neck: No JVD present.  Pulmonary/Chest: Effort normal. No stridor. No respiratory distress.  Abdomen: soft, non distended Neurological:  alert and oriented to person, place, and time.  Skin: Skin is warm and dry.  not diaphoretic.  Psychiatric:   normal mood and affect.   behavior is normal. Judgment and thought content normal.       Imaging: CT CHEST ABDOMEN PELVIS W CONTRAST Result Date: 05/13/2024 CLINICAL DATA:  History of bladder cancer. Metastatic disease evaluation. EXAM: CT CHEST, ABDOMEN, AND PELVIS WITH CONTRAST TECHNIQUE: Multidetector CT imaging of the chest, abdomen and pelvis was performed following the standard protocol during bolus administration of intravenous contrast. RADIATION DOSE REDUCTION: This exam was performed according to  the departmental dose-optimization program which includes automated exposure control, adjustment of the mA and/or kV according to patient size and/or use of iterative reconstruction technique. CONTRAST:  OMNIPAQUE  IOHEXOL  300 MG/ML  SOLN COMPARISON:  CT dated 06/23/2023. FINDINGS: CT CHEST FINDINGS Cardiovascular: There is no cardiomegaly or pericardial effusion. There is coronary vascular calcification and calcification of the mitral annulus. Right-sided Port-A-Cath with tip in the right atrium. Moderate atherosclerotic calcification of the thoracic aorta. No aneurysmal dilatation or dissection. The central pulmonary arteries are patent. Mediastinum/Nodes: No hilar adenopathy. Subcarinal adenopathy measures 2.2 cm in short axis new since the prior CT. The esophagus is grossly unremarkable. No mediastinal fluid collection. Lungs/Pleura: A 4 mm nodule in the left lower lobe (120/7) and new since the prior CT. A cluster of nodular density in the anterior left lung base (114/7) may be inflammatory or infectious in etiology. A 6 mm left upper lobe nodule (45/7) present on the prior CT. No consolidative changes. Trace right pleural effusion. No pneumothorax. The central airways are patent. Musculoskeletal: Osteopenia with degenerative changes of the spine. Old T8 and T9 compression fractures. No acute osseous pathology. CT ABDOMEN PELVIS FINDINGS No intra-abdominal free air.  Small ascites. Hepatobiliary: Large hypoenhancing masses throughout the liver consistent with metastatic disease and new since the prior CT. The gallbladder is distended. There is dilatation of the common bile duct measuring up to 17 mm in caliber. No stone noted in the gallbladder or in the bowel duct. Pancreas: A 1.4 x 1.1 cm hypoenhancing mass in the proximal pancreas may represent metastatic disease versus primary pancreatic adenocarcinoma. There is mild dilatation of the main pancreatic duct. Spleen: Normal in size without focal  abnormality. Adrenals/Urinary Tract: The adrenal glands unremarkable. Right renal inferior pole cyst. There is no hydronephrosis on either side there is symmetric enhancement and excretion of contrast by both kidneys. The urinary bladder is minimally distended. Trabeculated versus thickened right bladder dome. Stomach/Bowel: Moderate stool throughout the colon. No bowel obstruction. No evidence of acute appendicitis. Vascular/Lymphatic: Advanced aortoiliac atherosclerotic disease. The IVC is unremarkable. No portal venous gas. Retroperitoneal adenopathy measure up to 2 cm in short axis posterior to the IVC and 16 mm short axis posterior to the main portal vein. Right iliac chain or pelvic sidewall adenopathy/implant measures 2.4 x 2.0 cm (93/4). Additional left iliac chain  and retroperitoneal adenopathy. Reproductive: The uterus is grossly unremarkable. Other: Diffuse subcutaneous edema. Musculoskeletal: Osteopenia with degenerative changes of the spine. Mixed density, predominantly sclerotic lesion involving the L5 with pathologic fracture and approximately 50% loss of vertebral body height. There is buckling of the posterior cortex with approximately 5 mm retropulsion and focal narrowing of the central canal. Extension of the infiltrative osseous mass/metastasis into the left L5 pedicle and probable extension into the left L5-S1 neural foramina. IMPRESSION: 1. Interval development of metastatic disease in the chest, abdomen, and pelvis as above. 2. A 1.4 x 1.1 cm hypoenhancing mass in the proximal pancreas may represent metastatic disease versus primary pancreatic adenocarcinoma. 3. Metastatic osseous disease involving L5 with associated pathologic fracture and posterior buckling and focal narrowing of the central canal. Aortic Atherosclerosis (ICD10-I70.0). Electronically Signed   By: Vanetta Chou M.D.   On: 05/13/2024 19:02   MR Lumbar Spine W Wo Contrast Result Date: 05/13/2024 EXAM: MRI LUMBAR SPINE  05/13/2024 05:59:24 PM TECHNIQUE: Multiplanar multisequence MRI of the lumbar spine was performed with and without the administration of intravenous contrast. 4 mL of gadobutrol  (GADAVIST ) 1 MMOL/ML injection was administered. COMPARISON: CT of the lumbar spine 04/16/2024. CLINICAL HISTORY: Low back pain. Clinical history is abnormal CT of the lumbar spine and low back pain. FINDINGS: BONES AND ALIGNMENT: Normal alignment. Normal vertebral body heights *except for the compression fracture at L5 and superior endplate fracture at L3*. Superior endplate Schmorl nodes are present at T12, L1, and L2. SPINAL CORD: The conus terminates normally. SOFT TISSUES: No paraspinal mass. Enlarged paraaortic lymph nodes measure up to 20 mm on the right and 21 mm on the left. Dilated small bowel present in the right upper quadrant. Atherosclerotic changes are present in the abdominal aorta; largest transverse diameter is 2.8 cm. T12-L1: Superior endplate Schmorl node at T12. No significant disc herniation. No spinal canal stenosis or neural foraminal narrowing. L1-L2: Superior endplate Schmorl node at L1. No significant disc herniation. No spinal canal stenosis or neural foraminal narrowing. L2-L3: Superior endplate Schmorl node at L2. No significant disc herniation. No spinal canal stenosis or neural foraminal narrowing. L3-L4: Superior endplate fracture at L3 is similar to the prior exam. An enhancing mass lesion in the left anterior L3 vertebral body is more obvious than on the prior study, measuring 18 x 13 mm on sagittal images. An enhancing lesion in the right posterolateral L4 vertebral body extends into the pedicle without fracture. A 12 mm lesion is present in the anterior inferior L4 vertebral body. No significant disc herniation. No spinal canal stenosis or neural foraminal narrowing. L4-L5: Diffuse marrow signal abnormality and enhancement is present within the L5 vertebral body. A compression fracture has progressed with  minimal height now measuring 12 mm compared to 14 mm previously. Compressed bone extends posteriorly into the spinal canal. Abnormal signal and enhancement extends into the pedicles, left greater than right. No significant disc herniation. No spinal canal stenosis or neural foraminal narrowing. L5-S1: No significant disc herniation. No spinal canal stenosis or neural foraminal narrowing. IMPRESSION: 1. Multiple osseous lesions consistent with metastatic disease. 2. Progression of L5 compression fracture with minimal additional height loss, with abnormal signal and enhancement extending into the pedicles and bone retropulsed posteriorly into the spinal canal. 3. Enhancing lesions at L4 involving the right posterolateral vertebral body and pedicle and a separate 12 mm lesion in the anterior inferior L4 vertebral body. 4. New L3 superior endplate fracture with an enhancing mass in the left anterior  L3 vertebral body measuring 18 x 13 mm. 5. Enlarged paraaortic lymph nodes measuring up to 21 mm, also consistent with metastatic disease these have increased in size since the prior study. Electronically signed by: Lonni Necessary MD 05/13/2024 06:25 PM EST RP Workstation: [id number]    Labs:  CBC: Recent Labs    05/13/24 1628 05/14/24 0348 05/15/24 0431 05/17/24 2113  WBC 15.3* 14.6* 15.2* 14.7*  HGB 10.5* 9.4* 9.4* 11.2*  HCT 32.7* 30.1* 29.8* 35.5*  PLT 366 346 353 317    COAGS: No results for input(s): INR, APTT in the last 8760 hours.  BMP: Recent Labs    05/13/24 1604 05/14/24 0348 05/15/24 0431 05/17/24 2113  NA 134* 133* 135 134*  K 5.1 4.4 4.7 4.5  CL 99 101 101 99  CO2 24 22 25 24   GLUCOSE 93 98 108* 122*  BUN 20 17 14 17   CALCIUM 8.5* 8.0* 8.3* 8.4*  CREATININE 1.05* 0.90 0.88 1.03*  GFRNONAA 57* >60 >60 58*    LIVER FUNCTION TESTS: Recent Labs    12/30/23 2017 05/13/24 1604 05/14/24 0348 05/15/24 0431  BILITOT 0.2 1.2 0.8 0.8  AST 13* 43* 40 38  ALT 8 23 21  20   ALKPHOS 63 297* 297* 317*  PROT 6.7 6.5 5.8* 5.8*  ALBUMIN 3.3* 3.0* 2.7* 2.7*    TUMOR MARKERS: No results for input(s): AFPTM, CEA, CA199, CHROMGRNA in the last 8760 hours.  Assessment and Plan:  My impression is that this patient has osseous metastatic disease with painful pathologic compression fracture deformity of L5 which likely contributes to  or account for most of the low back pain.  Based on cross-sectional imaging, this would be anatomically approachable for percutaneous intervention.  Only mild spinal stenosis; no other contraindications.  We would obtain concomitant core biopsy for histopathologic confirmation. Given the  lack of adequate symptom relief with time and a fairly aggressive pain medication regimen, and   limitations of activities of daily living, the patient  is clinically an appropriate candidate for consideration of OsteoCool RF ablation of the lesion with cement assisted vertebral augmentation. I discussed with the patient and family the pathophysiology of pathologic vertebral compression fracture deformities.  We discussed treatment options including watchful waiting, surgical fixation, XRT, and OsteoCool RF ablation with percutaneous kyphoplasty.  We discussed in detail the OsteoCool and percutaneous kyphoplasty technique, anticipated benefits, time course to symptom resolution, possible risks and side effects.  We discussed the ability to proceed with concomitant radiation treatment for more complete tumor response.    They seemed to understand, and did ask appropriate questions. The patient is motivated to proceed with treatment ASAP.  Accordingly, we schedule percutaneous core bone biopsy, OsteoCool and kyphoplasty of pathologic painful L5 compression fracture deformity under moderate sedation  as an outpatient at the patient's convenience, pending carrier approval if needed.   Thank you for this interesting consult.  I greatly enjoyed meeting Raven Lopez and look forward to participating in their care.  A copy of this report was sent to the requesting provider on this date.  Electronically Signed: Dayne Kimmora Risenhoover 05/28/2024, 2:44 PM   I spent a total of  30 Minutes   in face to face in clinical consultation, greater than 50% of which was counseling/coordinating care for painful pathologic compression fracture of lumbar L5.

## 2024-05-30 NOTE — H&P (Signed)
 Chief Complaint: Patient was seen in consultation today for pathologic L5 compression fracture.   Referring Physician(s): Dr. Bernardino Come  Supervising Physician: Jennefer Rover  Patient Status: [facility] Raven Lopez - Outpatient   History of Present Illness: Raven Lopez is a 68 y.o. female with a medical history significant for bladder cancer s/p TURBT and chemoradiation, COPD, current tobacco use, anxiety, panic disorder, DDD and scoliosis. She first presented to the [facility] ED 04/16/24 with worsening back pain and was found to have a pathologic L5 compression fracture. She was treated conservatively in the ED and encouraged to follow up with Oncology.   She returned to the ED 05/13/24 with severe back pain and additional imaging/work up revealed diffuse metastatic disease. IR was consulted for L5 compression fracture kyphoplasty and our recommendation was for the patient to pursue this as an outpatient. She met with Dr. Johann 05/28/24 and he discussed treatment options including watchful waiting, surgical fixation, XRT, and OsteoCool RF ablation with percutaneous kyphoplasty.  He reviewed in detail the OsteoCool and percutaneous kyphoplasty technique, anticipated benefits, time course to symptom resolution, possible risks and side effects.   The patient was motivated to proceed and was scheduled for percutaneous core bone biopsy, OsteoCool and kyphoplasty of pathologic L5 compression fracture.  Past Medical History:  Diagnosis Date   AC (acromioclavicular) joint bone spurs    Anxiety    Asthma    COPD (chronic obstructive pulmonary disease) (HCC)    DDD (degenerative disc disease), lumbar    Hypertension    Neck fracture (HCC)    Panic disorder    Scoliosis     Past Surgical History:  Procedure Laterality Date   BLADDER INSTILLATION N/A 08/29/2022   Procedure: BLADDER INSTILLATION;  Surgeon: Sherrilee Belvie CROME, MD;  Location: [facility];  Service: Urology;  Laterality: N/A;    CESAREAN SECTION     CYSTOSCOPY W/ RETROGRADES Bilateral 08/29/2022   Procedure: CYSTOSCOPY WITH RETROGRADE PYELOGRAM;  Surgeon: Sherrilee Belvie CROME, MD;  Location: [facility];  Service: Urology;  Laterality: Bilateral;   IR IMAGING GUIDED PORT INSERTION  10/17/2022   TRANSURETHRAL RESECTION OF BLADDER TUMOR N/A 08/29/2022   Procedure: TRANSURETHRAL RESECTION OF BLADDER TUMOR (TURBT);  Surgeon: Sherrilee Belvie CROME, MD;  Location: [facility];  Service: Urology;  Laterality: N/A;   wisdom teeth removal      Allergies: Penicillins, Aspirin, Demerol [meperidine hcl], and Sulfonamide derivatives  Medications: Prior to Admission medications   Medication Sig Start Date End Date Taking? Authorizing Provider  acetaminophen  (TYLENOL ) 500 MG tablet Take 1,000 mg by mouth every 6 (six) hours as needed for mild pain (pain score 1-3) or moderate pain (pain score 4-6).    [provider]  fentaNYL  (DURAGESIC ) 12 MCG/HR Place 1 patch onto the skin every 3 (three) days for 21 days. 05/19/24 06/09/24  Willette Adriana LABOR, MD  fluconazole  (DIFLUCAN ) 100 MG tablet Take 1 tablet (100 mg total) by mouth at bedtime. 05/17/24   Come Bernardino NOVAK, MD  ibuprofen (ADVIL) 200 MG tablet Take 200 mg by mouth every 6 (six) hours as needed for mild pain (pain score 1-3). Patient not taking: Reported on 05/20/2024    [provider]  lidocaine  (LIDODERM ) 5 % Place 1 patch onto the skin daily. Remove & Discard patch within 12 hours or as directed by MD Patient not taking: Reported on 05/20/2024 05/19/24   Willette Adriana LABOR, MD  linaclotide  (LINZESS ) 145 MCG CAPS capsule Take 1 capsule (145 mcg total) by  mouth daily before breakfast. Patient not taking: Reported on 05/20/2024 05/19/24   Willette Adriana LABOR, MD  nicotine  (NICODERM CQ  - DOSED IN MG/24 HOURS) 14 mg/24hr patch Place 1 patch (14 mg total) onto the skin daily. Patient not taking: Reported on 05/20/2024 05/17/24   Bryn Bernardino NOVAK, MD  polyethylene glycol (MIRALAX  /  GLYCOLAX ) 17 g packet Take 17 g by mouth daily as needed for mild constipation. 05/17/24   Bryn Bernardino NOVAK, MD  senna (SENOKOT) 8.6 MG TABS tablet Take 1 tablet (8.6 mg total) by mouth at bedtime. 05/19/24   Shahmehdi, Adriana LABOR, MD  tizanidine  (ZANAFLEX ) 2 MG capsule Take 1 capsule (2 mg total) by mouth 3 (three) times daily. 05/19/24   Willette Adriana LABOR, MD     Family History  Problem Relation Age of Onset   Aneurysm Father    Kidney failure Mother    Hypertension Sister    Thyroid  disease Sister    Anxiety disorder Sister    Colitis Sister    Irritable bowel syndrome Sister     Social History   Socioeconomic History   Marital status: Widowed    Spouse name: Not on file   Number of children: Not on file   Years of education: Not on file   Highest education level: GED or equivalent  Occupational History   Not on file  Tobacco Use   Smoking status: Every Day    Current packs/day: 1.00    Average packs/day: 1 pack/day for 40.0 years (40.0 ttl pk-yrs)    Types: Cigarettes   Smokeless tobacco: Never  Vaping Use   Vaping status: Never Used  Substance and Sexual Activity   Alcohol  use: Not Currently   Drug use: Not Currently   Sexual activity: Not Currently    Birth control/protection: Post-menopausal  Other Topics Concern   Not on file  Social History Narrative   Not on file   Social Drivers of Health   Financial Resource Strain: Lopez Risk  (05/23/2024)   Overall Financial Resource Strain (CARDIA)    Difficulty of Paying Living Expenses: Not hard at all  Food Insecurity: No Food Insecurity (05/23/2024)   Hunger Vital Sign    Worried About Running Out of Food in the Last Year: Never true    Ran Out of Food in the Last Year: Never true  Recent Concern: Food Insecurity - Food Insecurity Present (04/14/2024)   Hunger Vital Sign    Worried About Running Out of Food in the Last Year: Never true    Ran Out of Food in the Last Year: Sometimes true  Transportation Needs: No  Transportation Needs (05/23/2024)   PRAPARE - Administrator, Civil Service (Medical): No    Lack of Transportation (Non-Medical): No  Physical Activity: Inactive (05/23/2024)   Exercise Vital Sign    Days of Exercise per Week: 0 days    Minutes of Exercise per Session: Not on file  Stress: No Stress Concern Present (05/23/2024)   [employer] of Occupational Health - Occupational Stress Questionnaire    Feeling of Stress: Only a little  Social Connections: Socially Isolated (05/23/2024)   Social Connection and Isolation Panel    Frequency of Communication with Friends and Family: More than three times a week    Frequency of Social Gatherings with Friends and Family: More than three times a week    Attends Religious Services: Never    Database Administrator or Organizations: No    Attends  Banker Meetings: Not on file    Marital Status: Widowed    Review of Systems: A 12 point ROS discussed and pertinent positives are indicated in the HPI above.  All other systems are negative.  Review of Systems  Vital Signs: There were no vitals taken for this visit.  Physical Exam  Labs:  CBC: Recent Labs    05/13/24 1628 05/14/24 0348 05/15/24 0431 05/17/24 2113  WBC 15.3* 14.6* 15.2* 14.7*  HGB 10.5* 9.4* 9.4* 11.2*  HCT 32.7* 30.1* 29.8* 35.5*  PLT 366 346 353 317    COAGS: No results for input(s): INR, APTT in the last 8760 hours.  BMP: Recent Labs    05/13/24 1604 05/14/24 0348 05/15/24 0431 05/17/24 2113  NA 134* 133* 135 134*  K 5.1 4.4 4.7 4.5  CL 99 101 101 99  CO2 24 22 25 24   GLUCOSE 93 98 108* 122*  BUN 20 17 14 17   CALCIUM 8.5* 8.0* 8.3* 8.4*  CREATININE 1.05* 0.90 0.88 1.03*  GFRNONAA 57* >60 >60 58*    LIVER FUNCTION TESTS: Recent Labs    12/30/23 2017 05/13/24 1604 05/14/24 0348 05/15/24 0431  BILITOT 0.2 1.2 0.8 0.8  AST 13* 43* 40 38  ALT 8 23 21 20   ALKPHOS 63 297* 297* 317*  PROT 6.7 6.5 5.8* 5.8*   ALBUMIN 3.3* 3.0* 2.7* 2.7*    TUMOR MARKERS: No results for input(s): AFPTM, CEA, CA199, CHROMGRNA in the last 8760 hours.  Assessment and Plan:  Pathologic L5 compression fracture: Raven Lopez, 68 year old female, presents today for an image-guided L5 bone biopsy, OsteoCool ablation and kyphoplasty.   Risks and benefits of this procedure were discussed with the patient including, but not limited to education regarding the natural healing process of compression fractures without intervention, bleeding, infection, cement migration which may cause spinal cord damage, paralysis, pulmonary embolism or even death.  All of the patient's questions were answered, patient is agreeable to proceed. She has been NPO. She does not take any blood-thinning   Consent signed and in chart.  Thank you for this interesting consult.  I greatly enjoyed meeting Raven Lopez and look forward to participating in their care.  A copy of this report was sent to the requesting provider on this date.  Electronically Signed: Warren Dais, AGACNP-BC 05/30/2024, 1:53 PM   I spent a total of  30 Minutes   in face to face in clinical consultation, greater than 50% of which was counseling/coordinating care for pathologic L5 compression fracture.

## 2024-05-31 ENCOUNTER — Other Ambulatory Visit: Payer: Self-pay

## 2024-05-31 ENCOUNTER — Ambulatory Visit
Admission: RE | Admit: 2024-05-31 | Discharge: 2024-05-31 | Disposition: A | Discharge status: Home / Self Care | Source: Ambulatory Visit | Attending: Interventional Radiology | Admitting: Interventional Radiology

## 2024-05-31 ENCOUNTER — Other Ambulatory Visit (HOSPITAL_COMMUNITY)
Admission: RE | Admit: 2024-05-31 | Discharge: 2024-05-31 | Disposition: A | Discharge status: Home / Self Care | Source: Ambulatory Visit | Attending: Interventional Radiology | Admitting: Interventional Radiology

## 2024-05-31 ENCOUNTER — Telehealth (HOSPITAL_COMMUNITY): Payer: Self-pay | Admitting: Student

## 2024-05-31 DIAGNOSIS — C7951 Secondary malignant neoplasm of bone: Secondary | ICD-10-CM | POA: Insufficient documentation

## 2024-05-31 DIAGNOSIS — S32050A Wedge compression fracture of fifth lumbar vertebra, initial encounter for closed fracture: Secondary | ICD-10-CM

## 2024-05-31 HISTORY — PX: IR BONE TUMOR(S)RF ABLATION: IMG2284

## 2024-05-31 HISTORY — PX: IR KYPHO LUMBAR INC FX REDUCE BONE BX UNI/BIL CANNULATION INC/IMAGING: IMG5519

## 2024-05-31 MED ORDER — LIDOCAINE-EPINEPHRINE 1 %-1:100000 IJ SOLN
20.0000 mL | Freq: Once | INTRAMUSCULAR | Status: AC
  Administered 2024-05-31: 20 mL via INTRADERMAL

## 2024-05-31 MED ORDER — MIDAZOLAM HCL (PF) 2 MG/2ML IJ SOLN: 1.0000 mg | INTRAMUSCULAR | Status: DC | PRN

## 2024-05-31 MED ORDER — MIDAZOLAM HCL (PF) 2 MG/2ML IJ SOLN
INTRAMUSCULAR | Status: AC | PRN
  Administered 2024-05-31 (×2): 1 mg via INTRAVENOUS

## 2024-05-31 MED ORDER — ACETAMINOPHEN 10 MG/ML IV SOLN
1000.0000 mg | Freq: Once | INTRAVENOUS | Status: AC
  Administered 2024-05-31: 1000 mg via INTRAVENOUS

## 2024-05-31 MED ORDER — FENTANYL CITRATE (PF) 50 MCG/ML IJ SOSY: 25.0000 ug | PREFILLED_SYRINGE | INTRAMUSCULAR | Status: DC | PRN

## 2024-05-31 MED ORDER — VANCOMYCIN HCL IN DEXTROSE 1-5 GM/200ML-% IV SOLN
1000.0000 mg | Freq: Once | INTRAVENOUS | Status: AC
  Administered 2024-05-31: 1000 mg via INTRAVENOUS

## 2024-05-31 MED ORDER — FENTANYL CITRATE (PF) 100 MCG/2ML IJ SOLN
INTRAMUSCULAR | Status: AC | PRN
  Administered 2024-05-31 (×3): 25 ug via INTRAVENOUS
  Administered 2024-05-31: 50 ug via INTRAVENOUS
  Administered 2024-05-31 (×3): 25 ug via INTRAVENOUS

## 2024-05-31 MED ORDER — KETOROLAC TROMETHAMINE 30 MG/ML IJ SOLN
30.0000 mg | Freq: Once | INTRAMUSCULAR | Status: AC
  Administered 2024-05-31: 30 mg via INTRAVENOUS

## 2024-05-31 MED ORDER — SODIUM CHLORIDE 0.9 % IV SOLN: INTRAVENOUS | Status: DC

## 2024-05-31 MED ORDER — FLUCONAZOLE 150 MG PO TABS: ORAL_TABLET | ORAL | 0 refills | Status: DC

## 2024-05-31 MED ORDER — NYSTATIN 100000 UNIT/ML MT SUSP: 5.0000 mL | Freq: Four times a day (QID) | OROMUCOSAL | 0 refills | Status: AC

## 2024-05-31 NOTE — Progress Notes (Signed)
 Orders in place to access pt's chest port, however, she asked this RN to place PIV because they have been having problems accessing the port, safety maintained

## 2024-05-31 NOTE — Procedures (Signed)
 Interventional Radiology Procedure Note  Procedure:  1) L5 bone biopsy 2) L5 radiofrequency ablation 3) L5 kyphoplasty  Findings: Please refer to procedural dictation for full description.  Complications: None immediate  Estimated Blood Loss: 5 mL  Recommendations: IR will arrange 3-4 week outpatient follow up.   Ester Sides, MD

## 2024-05-31 NOTE — Discharge Instructions (Signed)
 Osteocool Post-Procedure Discharge Instructions  You may resume a regular diet and any medications that you routinely take (including pain medications). No driving the day of the procedure. The day of your procedure, take it easy. You may use an ice pack as needed to the injection site(s) on your back.  Apply ice to your back 30 minutes on and 30 minutes off, as needed. You may remove the dressing tomorrow after taking a shower. Replace daily with a clean bandaid until healed.  Do not lift anything heavier than a milk jug for 1-2 weeks or determined by your physician.  Follow up with your physician in 2 weeks.   Please contact our office at [phone] for the following symptoms or if you have any questions:  Fever greater than 100 degrees Increased swelling, pain, or redness at injection site. Increased back and/or leg pain New numbness or change in symptoms from before the procedure.    If you need to speak to someone after hours, please call the on call IR physician at [phone].  Tell them you are a patient of Dr. Terrill and that you had a Osteocool today and the issues you are having.   Thank you for visiting [facility]!

## 2024-05-31 NOTE — Telephone Encounter (Signed)
 Diflucan  and nystatin  e-prescribed to patient's pharmacy. Patient requested the 3-day Diflucan  treatment and 7 day's of nystatin  treatment.   Warren Dais, AGACNP-BC 05/31/2024, 9:53 AM

## 2024-05-31 NOTE — Patient Instructions (Signed)

## 2024-05-31 NOTE — Patient Outreach (Signed)
 Social Drivers of Health  Community Resource and Care Coordination Visit Note   05/31/2024  Name: ROYCE SCIARA MRN: [id number] DOB:December 22, 1955  Situation: Referral received for [facility] needs assessment and assistance related to Level of Care. I obtained verbal consent from Caregiver.  Visit completed with son Jordan Yound on the phone.   Background:   SDOH Interventions Today    Flowsheet Row Most Recent Value  SDOH Interventions   Transportation Interventions Patient Resources (Friends/Family), [employer] Provided, Film/video Editor is using [place] transportation and [employer] when possible.  Family recently provided transportation to procedure.]     Assessment:   Goals Addressed             This Visit's Progress    COMPLETED: BSW Goals       Current SDOH Barriers:  Level of care concerns Hospital Bed  Interventions: Patient interviewed and appropriate screenings performed Referred patient to community resources  Patient is working with [facility] and has a nurse that visits 2 times a week along with other visits for support.  Son no longer needs Home Health at this time. The hospital bed was delivered, but was able to obtain a bed that works better from [facility].  Meals on Wheels has been cancelled [employer] used but was not the best option for patient due to bumpy ride Family assisted with transporting patient and it was successful.  Son has no other needs and case will be closed as he will continue to work with [employer].        Recommendation:   Patient will continue to work with [facility] for personal care needs.  Follow Up Plan:   Patient has achieved all patient stated goals. [employer] will be closed. Patient has been provided contact information should new needs arise.   Tillman Gardener, BSW [place]  [facility], [facility] Social Worker Direct Dial: [phone]   Fax: [phone] Website: [email or web address]

## 2024-06-01 ENCOUNTER — Other Ambulatory Visit: Payer: Self-pay

## 2024-06-01 ENCOUNTER — Encounter (HOSPITAL_COMMUNITY): Payer: Self-pay

## 2024-06-01 ENCOUNTER — Emergency Department (HOSPITAL_COMMUNITY)
Admission: EM | Admit: 2024-06-01 | Discharge: 2024-06-02 | Disposition: A | Discharge status: Home / Self Care | Attending: Emergency Medicine | Admitting: Emergency Medicine

## 2024-06-01 DIAGNOSIS — R600 Localized edema: Secondary | ICD-10-CM | POA: Insufficient documentation

## 2024-06-01 DIAGNOSIS — L7634 Postprocedural seroma of skin and subcutaneous tissue following other procedure: Secondary | ICD-10-CM | POA: Insufficient documentation

## 2024-06-01 DIAGNOSIS — Z9889 Other specified postprocedural states: Secondary | ICD-10-CM | POA: Insufficient documentation

## 2024-06-01 DIAGNOSIS — M549 Dorsalgia, unspecified: Secondary | ICD-10-CM | POA: Diagnosis not present

## 2024-06-01 NOTE — ED Provider Notes (Signed)
 [facility]PARTMENT AT [facility] Provider Note   CSN: [id number] Arrival date & time: 06/01/24  2340     Patient presents with: Post-op Problem   Raven Lopez is a 68 y.o. female.   68 year old female brought in by family with concern for postop complication.  Patient had kyphoplasty 2 days ago, is having a yellow-tinged clear fluid from her to post op sites.  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  They contacted her surgeon and they were directed to come to the emergency room.  She denies fevers, pain or other concerns at this time.        Prior to Admission medications   Medication Sig Start Date End Date Taking? Authorizing Provider  acetaminophen  (TYLENOL ) 500 MG tablet Take 1,000 mg by mouth every 6 (six) hours as needed for mild pain (pain score 1-3) or moderate pain (pain score 4-6).    [provider]  fentaNYL  (DURAGESIC ) 12 MCG/HR Place 1 patch onto the skin every 3 (three) days for 21 days. 05/19/24 06/09/24  Willette Adriana LABOR, MD  fluconazole  (DIFLUCAN ) 100 MG tablet Take 1 tablet (100 mg total) by mouth at bedtime. 05/17/24   Bryn Bernardino NOVAK, MD  fluconazole  (DIFLUCAN ) 150 MG tablet Take one tablet every 72 hours for three doses. 05/31/24   Covington, Jamie R, NP  ibuprofen (ADVIL) 200 MG tablet Take 200 mg by mouth every 6 (six) hours as needed for mild pain (pain score 1-3). Patient not taking: Reported on 05/20/2024    [provider]  lidocaine  (LIDODERM ) 5 % Place 1 patch onto the skin daily. Remove & Discard patch within 12 hours or as directed by MD Patient not taking: Reported on 05/20/2024 05/19/24   Willette Adriana LABOR, MD  linaclotide  (LINZESS ) 145 MCG CAPS capsule Take 1 capsule (145 mcg total) by mouth daily before breakfast. Patient not taking: Reported on 05/20/2024 05/19/24   Willette Adriana LABOR, MD  nicotine  (NICODERM CQ  - DOSED IN MG/24 HOURS) 14 mg/24hr patch Place 1 patch (14 mg total)  onto the skin daily. Patient not taking: Reported on 05/20/2024 05/17/24   Bryn Bernardino NOVAK, MD  nystatin  (MYCOSTATIN ) 100000 UNIT/ML suspension Take 5 mLs (500,000 Units total) by mouth 4 (four) times daily for 7 days. Swish in mouth for at least 30 seconds before swallowing. 05/31/24   Covington, Jamie R, NP  polyethylene glycol (MIRALAX  / GLYCOLAX ) 17 g packet Take 17 g by mouth daily as needed for mild constipation. 05/17/24   Bryn Bernardino NOVAK, MD  senna (SENOKOT) 8.6 MG TABS tablet Take 1 tablet (8.6 mg total) by mouth at bedtime. 05/19/24   Shahmehdi, Adriana LABOR, MD  tizanidine  (ZANAFLEX ) 2 MG capsule Take 1 capsule (2 mg total) by mouth 3 (three) times daily. 05/19/24   Willette Adriana LABOR, MD    Allergies: Penicillins, Aspirin, Demerol [meperidine hcl], and Sulfonamide derivatives    Review of Systems Negative except as per HPI Updated Vital Signs BP (!) 140/68   Pulse (!) 104   Temp 97.9 F (36.6 C) (Oral)   Resp 16   SpO2 98%   Physical Exam Vitals and nursing note reviewed.  Constitutional:      General: She is not in acute distress.    Appearance: She is well-developed. She is cachectic. She is not diaphoretic.     Comments: Chronically ill appearing  HENT:     Head: Normocephalic and atraumatic.  Pulmonary:     Effort:  Pulmonary effort is normal.  Skin:    General: Skin is warm and dry.     Findings: No erythema or rash.  Neurological:     Mental Status: She is alert and oriented to person, place, and time.  Psychiatric:        Behavior: Behavior normal.     (all labs ordered are listed, but only abnormal results are displayed) Labs Reviewed - No data to display  EKG: None  Radiology: IR KYPHO LUMBAR INC FX REDUCE BONE BX UNI/BIL CANNULATION INC/IMAGING Result Date: 05/31/2024 CLINICAL DATA:  68 year old female with history of bladder cancer and suspected pathologic fracture of the L5 vertebral body. EXAM: FLUOROSCOPIC GUIDED L5 VERTEBRAL BODY bone biopsy,  RF ABLATION, AND KYPHOPLASTY/CEMENT AUGMENTATION. COMPARISON:  05/13/2024 MEDICATIONS: Toradol  30 mg IV; Vancomycin  1 gm IV; The antibiotic was administered in an appropriate time interval prior to needle puncture of the skin. ANESTHESIA/SEDATION: Moderate (conscious) sedation was employed during this procedure. A total of Versed  2 mg and Fentanyl  200 mcg was administered intravenously. Moderate Sedation Time: 37 minutes. The patient's level of consciousness and vital signs were monitored continuously by radiology nursing throughout the procedure under my direct supervision. FLUOROSCOPY TIME:  Fluoroscopy Time: 12 minutes 42 seconds (1203 mGy). COMPLICATIONS: None immediate. TECHNIQUE: Informed written consent was obtained from the patient after a thorough discussion of the procedural risks, benefits and alternatives. All questions were addressed. Maximal Sterile Barrier Technique was utilized including caps, mask, sterile gowns, sterile gloves, sterile drape, hand hygiene and skin antiseptic. A timeout was performed prior to the initiation of the procedure. The patient was placed prone on the fluoroscopic table. The skin overlying the lumbar region was then prepped and draped in the usual sterile fashion. Maximal barrier sterile technique was utilized including caps, mask, sterile gowns, sterile gloves, sterile drape, hand hygiene and skin antiseptic. Intravenous Fentanyl  and Versed  were administered as conscious sedation during continuous cardiorespiratory monitoring by the radiology RN. The left pedicle at L5 was then infiltrated with 1% lidocaine  followed by the advancement of a Kyphon trocar needle through the left pedicle into the posterior one-third of the vertebral body. Bone biopsy was obtained. Subsequently, the osteo drill was advanced to the anterior third of the vertebral body. The osteo drill was retracted. Through the working cannula, a 10 mm Osetocool RF ablation probe was inserted and positioned  under fluoroscopic guidance. In similar fashion, the right L5 pedicle was infiltrated with 1% lidocaine . Utilizing a extra pedicular approach, a second Kyphon trocar needle was advanced into the posterior third of the vertebral body. Subsequently, the osteo drill was coaxially advanced to the anterior right third. The osteo drill was exchanged for a 10 mm Oseto cool RF ablation probe which was positioned under fluoroscopic guidance. With both Osteocool ablation probes in place, the ablation was performed for 6 minutes. Attention was now paid towards the kyphoplasty portion of the procedure. Beginning at the L5 vertebral body level, a Kyphon inflatable bone tamp 15 x 3 was advanced through both working cannulas and positioned with the distal marker approximately 5 mm from the anterior aspect of the cortex. Appropriate positioning was confirmed on the AP projection. At this time, the balloon was expanded using contrast via a Kyphon inflation syringe device via micro tubing. Inflations were continued under direct fluoroscopic guidance. At this time, methylmethacrylate mixture was reconstituted in the Kyphon bone mixing device system. This was then loaded into the delivery mechanism, attached to Kyphon bone fillers. The balloons were deflated and  removed followed by the instillation of methylmethacrylate mixture with excellent filling in the AP and lateral projections. The working cannulae and the bone filler were then retrieved and removed. Multiple spot radiographic images were obtained in various obliquities. Hemostasis was achieved with manual compression. The patient tolerated the procedure well without immediate postprocedural complication. FINDINGS: Completion images demonstrate a technically excellent result with adequate cement filling of the L5 vertebral bodies on both the AP and lateral projections. No extravasation was noted in the disk spaces or posteriorly into the spinal canal, though cement was noted  projecting along the posterior margin of the ablation zone via the right extra pedicular access. No epidural venous contamination was seen. IMPRESSION: Technically successful L5 vertebral body bone biopsy, radiofrequency ablation, and cement augmentation using balloon kyphoplasty. PLAN: The patient will be seen for clinical follow-up at the interventional radiology clinic in 2-4 weeks. Ester Sides, MD [facility] Radiology Electronically Signed   By: Ester Sides M.D.   On: 05/31/2024 13:22   IR Bone Tumor(s)RF Ablation Result Date: 05/31/2024 CLINICAL DATA:  68 year old female with history of bladder cancer and suspected pathologic fracture of the L5 vertebral body. EXAM: FLUOROSCOPIC GUIDED L5 VERTEBRAL BODY bone biopsy, RF ABLATION, AND KYPHOPLASTY/CEMENT AUGMENTATION. COMPARISON:  05/13/2024 MEDICATIONS: Toradol  30 mg IV; Vancomycin  1 gm IV; The antibiotic was administered in an appropriate time interval prior to needle puncture of the skin. ANESTHESIA/SEDATION: Moderate (conscious) sedation was employed during this procedure. A total of Versed  2 mg and Fentanyl  200 mcg was administered intravenously. Moderate Sedation Time: 37 minutes. The patient's level of consciousness and vital signs were monitored continuously by radiology nursing throughout the procedure under my direct supervision. FLUOROSCOPY TIME:  Fluoroscopy Time: 12 minutes 42 seconds (1203 mGy). COMPLICATIONS: None immediate. TECHNIQUE: Informed written consent was obtained from the patient after a thorough discussion of the procedural risks, benefits and alternatives. All questions were addressed. Maximal Sterile Barrier Technique was utilized including caps, mask, sterile gowns, sterile gloves, sterile drape, hand hygiene and skin antiseptic. A timeout was performed prior to the initiation of the procedure. The patient was placed prone on the fluoroscopic table. The skin overlying the lumbar  region was then prepped and draped in the usual sterile fashion. Maximal barrier sterile technique was utilized including caps, mask, sterile gowns, sterile gloves, sterile drape, hand hygiene and skin antiseptic. Intravenous Fentanyl  and Versed  were administered as conscious sedation during continuous cardiorespiratory monitoring by the radiology RN. The left pedicle at L5 was then infiltrated with 1% lidocaine  followed by the advancement of a Kyphon trocar needle through the left pedicle into the posterior one-third of the vertebral body. Bone biopsy was obtained. Subsequently, the osteo drill was advanced to the anterior third of the vertebral body. The osteo drill was retracted. Through the working cannula, a 10 mm Osetocool RF ablation probe was inserted and positioned under fluoroscopic guidance. In similar fashion, the right L5 pedicle was infiltrated with 1% lidocaine . Utilizing a extra pedicular approach, a second Kyphon trocar needle was advanced into the posterior third of the vertebral body. Subsequently, the osteo drill was coaxially advanced to the anterior right third. The osteo drill was exchanged for a 10 mm Oseto cool RF ablation probe which was positioned under fluoroscopic guidance. With both Osteocool ablation probes in place, the ablation was performed for 6 minutes. Attention was now paid towards the kyphoplasty portion of the procedure. Beginning at the L5 vertebral body level, a Kyphon inflatable bone tamp 15 x  3 was advanced through both working cannulas and positioned with the distal marker approximately 5 mm from the anterior aspect of the cortex. Appropriate positioning was confirmed on the AP projection. At this time, the balloon was expanded using contrast via a Kyphon inflation syringe device via micro tubing. Inflations were continued under direct fluoroscopic guidance. At this time, methylmethacrylate mixture was reconstituted in the Kyphon bone mixing device system. This was then  loaded into the delivery mechanism, attached to Kyphon bone fillers. The balloons were deflated and removed followed by the instillation of methylmethacrylate mixture with excellent filling in the AP and lateral projections. The working cannulae and the bone filler were then retrieved and removed. Multiple spot radiographic images were obtained in various obliquities. Hemostasis was achieved with manual compression. The patient tolerated the procedure well without immediate postprocedural complication. FINDINGS: Completion images demonstrate a technically excellent result with adequate cement filling of the L5 vertebral bodies on both the AP and lateral projections. No extravasation was noted in the disk spaces or posteriorly into the spinal canal, though cement was noted projecting along the posterior margin of the ablation zone via the right extra pedicular access. No epidural venous contamination was seen. IMPRESSION: Technically successful L5 vertebral body bone biopsy, radiofrequency ablation, and cement augmentation using balloon kyphoplasty. PLAN: The patient will be seen for clinical follow-up at the interventional radiology clinic in 2-4 weeks. Ester Sides, MD [facility] Radiology Electronically Signed   By: Ester Sides M.D.   On: 05/31/2024 13:22     Procedures   Medications Ordered in the ED - No data to display                                  Medical Decision Making Amount and/or Complexity of Data Reviewed Radiology: ordered.   This patient presents to the ED for concern of post op drainage, this involves an extensive number of treatment options, and is a complaint that carries with it a high risk of complications and morbidity.  The differential diagnosis includes serous fluid vs CSF leak   Co morbidities / Chronic conditions that complicate the patient evaluation  Bladder cancer, compression fx L5, scoliosis, COPS, DDD< HTN,  asthma   Additional history obtained:  Additional history obtained from EMR External records from outside source obtained and reviewed including prior op note from IR dated 05/31/25   Imaging Studies ordered:  I ordered imaging studies including MRI lumbar spine  Transferred to [facility] for imaging   Problem List / ED Course / Critical interventions / Medication management  68 yo female presents after image-guided L5 bone biopsy, OsteoCool ablation and kyphoplasty completed 2 days ago (05/31/24) with IR. Concern for drainage from the incision sites, enough to soak a washcloth tonight. No complaints otherwise. Found to have active drainage from incision site.  Discussed with IR who recommends MRI to evaluate structurally.  Patient transferred to [facility], ER for further workup and care. In the hour of her care here, she has saturated her shirt. I have reviewed the patients home medicines and have made adjustments as needed   Consultations Obtained:  I requested consultation with IR- Dr. Sides,  and discussed lab and imaging findings as well as pertinent plan - they recommend: recommends MRI lumbar spine to further evaluate.  Dr. Emil accepting transfer to [facility].    Social Determinants of Health:  Lives with family  Test / Admission - Considered:  Transfer to [facility] for MRI and further care      Final diagnoses:  Status post kyphoplasty    ED Discharge Orders     None          Beverley Leita DELENA DEVONNA 06/02/24 0104    Raford Lenis, MD 06/02/24 [phone]

## 2024-06-01 NOTE — ED Provider Notes (Incomplete)
 [place] [facility]ERGENC[facility] DEPARTMENT AT [facility] Provider Note   CSN: [id number] Arrival date & time: 06/01/24  2340     Patient presents with: Post-op Problem   Raven Lopez is a 68 y.o. female.  {Add pertinent medical, surgical, social history, OB history to HPI:6064} 68 year old female brought in by family with concern for postop complication.  Patient had kyphoplasty 2 days ago, is having a yellow-tinged clear fluid from her to post op sites.  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  They contacted her surgeon and they were directed to come to the emergency room.  She denies fevers, pain or other concerns at this time.        Prior to Admission medications   Medication Sig Start Date End Date Taking? Authorizing Provider  acetaminophen  (TYLENOL ) 500 MG tablet Take 1,000 mg by mouth every 6 (six) hours as needed for mild pain (pain score 1-3) or moderate pain (pain score 4-6).    [provider]  fentaNYL  (DURAGESIC ) 12 MCG/HR Place 1 patch onto the skin every 3 (three) days for 21 days. 05/19/24 06/09/24  Willette Adriana LABOR, MD  fluconazole  (DIFLUCAN ) 100 MG tablet Take 1 tablet (100 mg total) by mouth at bedtime. 05/17/24   Bryn Bernardino NOVAK, MD  fluconazole  (DIFLUCAN ) 150 MG tablet Take one tablet every 72 hours for three doses. 05/31/24   Covington, Jamie R, NP  ibuprofen (ADVIL) 200 MG tablet Take 200 mg by mouth every 6 (six) hours as needed for mild pain (pain score 1-3). Patient not taking: Reported on 05/20/2024    [provider]  lidocaine  (LIDODERM ) 5 % Place 1 patch onto the skin daily. Remove & Discard patch within 12 hours or as directed by MD Patient not taking: Reported on 05/20/2024 05/19/24   Willette Adriana LABOR, MD  linaclotide  (LINZESS ) 145 MCG CAPS capsule Take 1 capsule (145 mcg total) by mouth daily before breakfast. Patient not taking: Reported on 05/20/2024 05/19/24   Shahmehdi, Seyed A, MD  nicotine  (NICODERM  CQ - DOSED IN MG/24 HOURS) 14 mg/24hr patch Place 1 patch (14 mg total) onto the skin daily. Patient not taking: Reported on 05/20/2024 05/17/24   Bryn Bernardino NOVAK, MD  nystatin  (MYCOSTATIN ) 100000 UNIT/ML suspension Take 5 mLs (500,000 Units total) by mouth 4 (four) times daily for 7 days. Swish in mouth for at least 30 seconds before swallowing. 05/31/24   Covington, Jamie R, NP  polyethylene glycol (MIRALAX  / GLYCOLAX ) 17 g packet Take 17 g by mouth daily as needed for mild constipation. 05/17/24   Bryn Bernardino NOVAK, MD  senna (SENOKOT) 8.6 MG TABS tablet Take 1 tablet (8.6 mg total) by mouth at bedtime. 05/19/24   Shahmehdi, Adriana LABOR, MD  tizanidine  (ZANAFLEX ) 2 MG capsule Take 1 capsule (2 mg total) by mouth 3 (three) times daily. 05/19/24   Willette Adriana LABOR, MD    Allergies: Penicillins, Aspirin, Demerol [meperidine hcl], and Sulfonamide derivatives    Review of Systems Negative except as per HPI Updated Vital Signs BP (!) 140/68   Pulse (!) 104   Temp 97.9 F (36.6 C) (Oral)   Resp 16   SpO2 98%   Physical Exam Vitals and nursing note reviewed.  Constitutional:      General: She is not in acute distress.    Appearance: She is well-developed. She is not diaphoretic.  HENT:     Head: Normocephalic and atraumatic.  Pulmonary:     Effort: Pulmonary effort  is normal.  Skin:    General: Skin is warm and dry.     Findings: No erythema or rash.  Neurological:     Mental Status: She is alert and oriented to person, place, and time.  Psychiatric:        Behavior: Behavior normal.     (all labs ordered are listed, but only abnormal results are displayed) Labs Reviewed - No data to display  EKG: None  Radiology: IR KYPHO LUMBAR INC FX REDUCE BONE BX UNI/BIL CANNULATION INC/IMAGING Result Date: 05/31/2024 CLINICAL DATA:  68 year old female with history of bladder cancer and suspected pathologic fracture of the L5 vertebral body. EXAM: FLUOROSCOPIC GUIDED L5 VERTEBRAL BODY  bone biopsy, RF ABLATION, AND KYPHOPLASTY/CEMENT AUGMENTATION. COMPARISON:  05/13/2024 MEDICATIONS: Toradol  30 mg IV; Vancomycin  1 gm IV; The antibiotic was administered in an appropriate time interval prior to needle puncture of the skin. ANESTHESIA/SEDATION: Moderate (conscious) sedation was employed during this procedure. A total of Versed  2 mg and Fentanyl  200 mcg was administered intravenously. Moderate Sedation Time: 37 minutes. The patient's level of consciousness and vital signs were monitored continuously by radiology nursing throughout the procedure under my direct supervision. FLUOROSCOPY TIME:  Fluoroscopy Time: 12 minutes 42 seconds (1203 mGy). COMPLICATIONS: None immediate. TECHNIQUE: Informed written consent was obtained from the patient after a thorough discussion of the procedural risks, benefits and alternatives. All questions were addressed. Maximal Sterile Barrier Technique was utilized including caps, mask, sterile gowns, sterile gloves, sterile drape, hand hygiene and skin antiseptic. A timeout was performed prior to the initiation of the procedure. The patient was placed prone on the fluoroscopic table. The skin overlying the lumbar region was then prepped and draped in the usual sterile fashion. Maximal barrier sterile technique was utilized including caps, mask, sterile gowns, sterile gloves, sterile drape, hand hygiene and skin antiseptic. Intravenous Fentanyl  and Versed  were administered as conscious sedation during continuous cardiorespiratory monitoring by the radiology RN. The left pedicle at L5 was then infiltrated with 1% lidocaine  followed by the advancement of a Kyphon trocar needle through the left pedicle into the posterior one-third of the vertebral body. Bone biopsy was obtained. Subsequently, the osteo drill was advanced to the anterior third of the vertebral body. The osteo drill was retracted. Through the working cannula, a 10 mm Osetocool RF ablation probe was inserted and  positioned under fluoroscopic guidance. In similar fashion, the right L5 pedicle was infiltrated with 1% lidocaine . Utilizing a extra pedicular approach, a second Kyphon trocar needle was advanced into the posterior third of the vertebral body. Subsequently, the osteo drill was coaxially advanced to the anterior right third. The osteo drill was exchanged for a 10 mm Oseto cool RF ablation probe which was positioned under fluoroscopic guidance. With both Osteocool ablation probes in place, the ablation was performed for 6 minutes. Attention was now paid towards the kyphoplasty portion of the procedure. Beginning at the L5 vertebral body level, a Kyphon inflatable bone tamp 15 x 3 was advanced through both working cannulas and positioned with the distal marker approximately 5 mm from the anterior aspect of the cortex. Appropriate positioning was confirmed on the AP projection. At this time, the balloon was expanded using contrast via a Kyphon inflation syringe device via micro tubing. Inflations were continued under direct fluoroscopic guidance. At this time, methylmethacrylate mixture was reconstituted in the Kyphon bone mixing device system. This was then loaded into the delivery mechanism, attached to Kyphon bone fillers. The balloons were deflated and removed followed  by the instillation of methylmethacrylate mixture with excellent filling in the AP and lateral projections. The working cannulae and the bone filler were then retrieved and removed. Multiple spot radiographic images were obtained in various obliquities. Hemostasis was achieved with manual compression. The patient tolerated the procedure well without immediate postprocedural complication. FINDINGS: Completion images demonstrate a technically excellent result with adequate cement filling of the L5 vertebral bodies on both the AP and lateral projections. No extravasation was noted in the disk spaces or posteriorly into the spinal canal, though cement  was noted projecting along the posterior margin of the ablation zone via the right extra pedicular access. No epidural venous contamination was seen. IMPRESSION: Technically successful L5 vertebral body bone biopsy, radiofrequency ablation, and cement augmentation using balloon kyphoplasty. PLAN: The patient will be seen for clinical follow-up at the interventional radiology clinic in 2-4 weeks. Ester Sides, MD [facility] Electronically Signed   By: Ester Sides M.D.   On: 05/31/2024 13:22   IR Bone Tumor(s)RF Ablation Result Date: 05/31/2024 CLINICAL DATA:  68 year old female with history of bladder cancer and suspected pathologic fracture of the L5 vertebral body. EXAM: FLUOROSCOPIC GUIDED L5 VERTEBRAL BODY bone biopsy, RF ABLATION, AND KYPHOPLASTY/CEMENT AUGMENTATION. COMPARISON:  05/13/2024 MEDICATIONS: Toradol  30 mg IV; Vancomycin  1 gm IV; The antibiotic was administered in an appropriate time interval prior to needle puncture of the skin. ANESTHESIA/SEDATION: Moderate (conscious) sedation was employed during this procedure. A total of Versed  2 mg and Fentanyl  200 mcg was administered intravenously. Moderate Sedation Time: 37 minutes. The patient's level of consciousness and vital signs were monitored continuously by radiology nursing throughout the procedure under my direct supervision. FLUOROSCOPY TIME:  Fluoroscopy Time: 12 minutes 42 seconds (1203 mGy). COMPLICATIONS: None immediate. TECHNIQUE: Informed written consent was obtained from the patient after a thorough discussion of the procedural risks, benefits and alternatives. All questions were addressed. Maximal Sterile Barrier Technique was utilized including caps, mask, sterile gowns, sterile gloves, sterile drape, hand hygiene and skin antiseptic. A timeout was performed prior to the initiation of the procedure. The patient was placed prone on the fluoroscopic table. The skin overlying  the lumbar region was then prepped and draped in the usual sterile fashion. Maximal barrier sterile technique was utilized including caps, mask, sterile gowns, sterile gloves, sterile drape, hand hygiene and skin antiseptic. Intravenous Fentanyl  and Versed  were administered as conscious sedation during continuous cardiorespiratory monitoring by the radiology RN. The left pedicle at L5 was then infiltrated with 1% lidocaine  followed by the advancement of a Kyphon trocar needle through the left pedicle into the posterior one-third of the vertebral body. Bone biopsy was obtained. Subsequently, the osteo drill was advanced to the anterior third of the vertebral body. The osteo drill was retracted. Through the working cannula, a 10 mm Osetocool RF ablation probe was inserted and positioned under fluoroscopic guidance. In similar fashion, the right L5 pedicle was infiltrated with 1% lidocaine . Utilizing a extra pedicular approach, a second Kyphon trocar needle was advanced into the posterior third of the vertebral body. Subsequently, the osteo drill was coaxially advanced to the anterior right third. The osteo drill was exchanged for a 10 mm Oseto cool RF ablation probe which was positioned under fluoroscopic guidance. With both Osteocool ablation probes in place, the ablation was performed for 6 minutes. Attention was now paid towards the kyphoplasty portion of the procedure. Beginning at the L5 vertebral body level, a Kyphon inflatable bone tamp 15 x 3 was  advanced through both working cannulas and positioned with the distal marker approximately 5 mm from the anterior aspect of the cortex. Appropriate positioning was confirmed on the AP projection. At this time, the balloon was expanded using contrast via a Kyphon inflation syringe device via micro tubing. Inflations were continued under direct fluoroscopic guidance. At this time, methylmethacrylate mixture was reconstituted in the Kyphon bone mixing device system. This  was then loaded into the delivery mechanism, attached to Kyphon bone fillers. The balloons were deflated and removed followed by the instillation of methylmethacrylate mixture with excellent filling in the AP and lateral projections. The working cannulae and the bone filler were then retrieved and removed. Multiple spot radiographic images were obtained in various obliquities. Hemostasis was achieved with manual compression. The patient tolerated the procedure well without immediate postprocedural complication. FINDINGS: Completion images demonstrate a technically excellent result with adequate cement filling of the L5 vertebral bodies on both the AP and lateral projections. No extravasation was noted in the disk spaces or posteriorly into the spinal canal, though cement was noted projecting along the posterior margin of the ablation zone via the right extra pedicular access. No epidural venous contamination was seen. IMPRESSION: Technically successful L5 vertebral body bone biopsy, radiofrequency ablation, and cement augmentation using balloon kyphoplasty. PLAN: The patient will be seen for clinical follow-up at the interventional radiology clinic in 2-4 weeks. Ester Sides, MD [facility] Electronically Signed   By: Ester Sides M.D.   On: 05/31/2024 13:22    {Document cardiac monitor, telemetry assessment procedure when appropriate:32947} Procedures   Medications Ordered in the ED - No data to display    {Click here for ABCD2, HEART and other calculators REFRESH Note before signing:1}                              Medical Decision Making  ***  {Document critical care time when appropriate  Document review of labs and clinical decision tools ie [id number], etc  Document your independent review of radiology images and any outside records  Document your discussion with family members, caretakers and with consultants  Document social  determinants of health affecting pt's care  Document your decision making why or why not admission, treatments were needed:32947:::1}   Final diagnoses:  None    ED Discharge Orders     None

## 2024-06-01 NOTE — ED Triage Notes (Signed)
 Pt states that they put cement in her back 2 days ago and her incision site is leaking clear/yellow fluid enough to cover a wash rag in 30 mins. Pt has a small open incision to her lower back.

## 2024-06-02 ENCOUNTER — Emergency Department (HOSPITAL_COMMUNITY)
Admission: EM | Admit: 2024-06-02 | Discharge: 2024-06-02 | Disposition: A | Discharge status: Home / Self Care | Source: Home / Self Care | Attending: Radiology | Admitting: Radiology

## 2024-06-02 ENCOUNTER — Emergency Department (HOSPITAL_COMMUNITY)

## 2024-06-02 ENCOUNTER — Other Ambulatory Visit: Payer: Self-pay

## 2024-06-02 ENCOUNTER — Emergency Department (HOSPITAL_COMMUNITY): Admission: EM | Admit: 2024-06-02 | Discharge: 2024-06-02 | Disposition: A | Discharge status: Home / Self Care

## 2024-06-02 ENCOUNTER — Telehealth: Payer: Self-pay

## 2024-06-02 ENCOUNTER — Encounter (HOSPITAL_COMMUNITY): Payer: Self-pay | Admitting: Emergency Medicine

## 2024-06-02 DIAGNOSIS — Z743 Need for continuous supervision: Secondary | ICD-10-CM | POA: Diagnosis not present

## 2024-06-02 DIAGNOSIS — R531 Weakness: Secondary | ICD-10-CM | POA: Diagnosis not present

## 2024-06-02 DIAGNOSIS — Z9889 Other specified postprocedural states: Secondary | ICD-10-CM | POA: Insufficient documentation

## 2024-06-02 DIAGNOSIS — R0902 Hypoxemia: Secondary | ICD-10-CM | POA: Diagnosis not present

## 2024-06-02 DIAGNOSIS — R6889 Other general symptoms and signs: Secondary | ICD-10-CM | POA: Diagnosis not present

## 2024-06-02 HISTORY — PX: IR RADIOLOGIST EVAL & MGMT: IMG5224

## 2024-06-02 LAB — SURGICAL PATHOLOGY

## 2024-06-02 MED ORDER — MORPHINE SULFATE (PF) 4 MG/ML IV SOLN
4.0000 mg | Freq: Once | INTRAVENOUS | Status: AC
  Administered 2024-06-02: 4 mg via INTRAVENOUS
  Filled 2024-06-02: qty 1

## 2024-06-02 MED ORDER — ONDANSETRON HCL 4 MG/2ML IJ SOLN
4.0000 mg | Freq: Once | INTRAMUSCULAR | Status: AC
  Administered 2024-06-02: 4 mg via INTRAVENOUS
  Filled 2024-06-02: qty 2

## 2024-06-02 MED ORDER — DIAZEPAM 5 MG PO TABS
5.0000 mg | ORAL_TABLET | Freq: Once | ORAL | Status: AC
  Administered 2024-06-02: 5 mg via ORAL
  Filled 2024-06-02: qty 1

## 2024-06-02 MED ORDER — GADOBUTROL 1 MMOL/ML IV SOLN
4.0000 mL | Freq: Once | INTRAVENOUS | Status: AC | PRN
  Administered 2024-06-02: 4 mL via INTRAVENOUS

## 2024-06-02 NOTE — ED Notes (Signed)
 Patient transported to MRI

## 2024-06-02 NOTE — ED Provider Notes (Signed)
[place] EMERGENCY DEPARTMENT AT [facility] Provider Note   CSN: [id number] Arrival date & time: 06/02/24  9340     Patient presents with: Post-op Problem   Raven Lopez is a 68 y.o. female.   Patient returns today with concern for postop complication.  She was seen last night for same, had MRI and was instructed to follow-up with her surgeon in clinic today. She misunderstood and thought she was supposed to come back to the ER for the surgeon to see her. She reports she has continued drainage from her surgical site of clear yellow fluid. Denies any changes in condition since she was seen last night.  The history is provided by the patient. No language interpreter was used.       Prior to Admission medications   Medication Sig Start Date End Date Taking? Authorizing Provider  acetaminophen  (TYLENOL ) 500 MG tablet Take 1,000 mg by mouth every 6 (six) hours as needed for mild pain (pain score 1-3) or moderate pain (pain score 4-6).    [provider]  fentaNYL  (DURAGESIC ) 12 MCG/HR Place 1 patch onto the skin every 3 (three) days for 21 days. 05/19/24 06/09/24  Willette Adriana LABOR, MD  fluconazole  (DIFLUCAN ) 100 MG tablet Take 1 tablet (100 mg total) by mouth at bedtime. 05/17/24   Bryn Bernardino NOVAK, MD  fluconazole  (DIFLUCAN ) 150 MG tablet Take one tablet every 72 hours for three doses. 05/31/24   Covington, Jamie R, NP  ibuprofen (ADVIL) 200 MG tablet Take 200 mg by mouth every 6 (six) hours as needed for mild pain (pain score 1-3). Patient not taking: Reported on 05/20/2024    [provider]  lidocaine  (LIDODERM ) 5 % Place 1 patch onto the skin daily. Remove & Discard patch within 12 hours or as directed by MD Patient not taking: Reported on 05/20/2024 05/19/24   Willette Adriana LABOR, MD  linaclotide  (LINZESS ) 145 MCG CAPS capsule Take 1 capsule (145 mcg total) by mouth daily before breakfast. Patient not taking: Reported on 05/20/2024 05/19/24    Shahmehdi, Seyed A, MD  nicotine  (NICODERM CQ  - DOSED IN MG/24 HOURS) 14 mg/24hr patch Place 1 patch (14 mg total) onto the skin daily. Patient not taking: Reported on 05/20/2024 05/17/24   Bryn Bernardino NOVAK, MD  nystatin  (MYCOSTATIN ) 100000 UNIT/ML suspension Take 5 mLs (500,000 Units total) by mouth 4 (four) times daily for 7 days. Swish in mouth for at least 30 seconds before swallowing. 05/31/24 07/02/2024  Covington, Jamie R, NP  polyethylene glycol (MIRALAX  / GLYCOLAX ) 17 g packet Take 17 g by mouth daily as needed for mild constipation. 05/17/24   Bryn Bernardino NOVAK, MD  senna (SENOKOT) 8.6 MG TABS tablet Take 1 tablet (8.6 mg total) by mouth at bedtime. 05/19/24   Shahmehdi, Adriana LABOR, MD  tizanidine  (ZANAFLEX ) 2 MG capsule Take 1 capsule (2 mg total) by mouth 3 (three) times daily. 05/19/24   Willette Adriana LABOR, MD    Allergies: Penicillins, Aspirin, Demerol [meperidine hcl], and Sulfonamide derivatives    Review of Systems  All other systems reviewed and are negative.   Updated Vital Signs BP (!) 94/47   Pulse 96   Temp 98.4 F (36.9 C) (Oral)   Resp 20   SpO2 95%   Physical Exam Vitals and nursing note reviewed.  Constitutional:      General: She is not in acute distress.    Appearance: Normal appearance. She is cachectic. She is not toxic-appearing or diaphoretic.  Comments: Chronically ill appearing  HENT:     Head: Normocephalic and atraumatic.  Cardiovascular:     Rate and Rhythm: Normal rate.  Pulmonary:     Effort: Pulmonary effort is normal. No respiratory distress.  Musculoskeletal:        General: Normal range of motion.     Cervical back: Normal range of motion.     Comments: 2 incision sites noted to the lower back with few drops of serous appearing fluid noted.   Skin:    General: Skin is warm and dry.  Neurological:     General: No focal deficit present.     Mental Status: She is alert.  Psychiatric:        Mood and Affect: Mood normal.        Behavior:  Behavior normal.     (all labs ordered are listed, but only abnormal results are displayed) Labs Reviewed - No data to display  EKG: None  Radiology: MR Lumbar Spine W Wo Contrast Result Date: 06/02/2024 EXAM: MRI LUMBAR SPINE 06/02/2024 05:11:00 AM TECHNIQUE: Multiplanar multisequence MRI of the lumbar spine was performed with and without the administration of intravenous contrast. 4 mL (gadobutrol  (GADAVIST ) 1 MMOL/ML injection 4 mL GADOBUTROL  1 MMOL/ML IV SOLN) was administered. COMPARISON: MRI of the lumbar spine dated 05/13/2024. CLINICAL HISTORY: Spine surgery/procedure, postop, infection suspected. FINDINGS: BONES AND ALIGNMENT: Normal alignment. Enhancing metastatic lesions are again noted within the L3, L4, and L5 vertebrae. A pathological compression fracture at L5 is also again noted with retropulsion and posterior bowing of the posterior wall of the vertebral body. Since the previous study, the patient has undergone bilateral vertebral augmentation at L5. The methylmethacrylate is appropriately situated within the vertebral body bilaterally. A mild-to-moderate compression fracture of L3 is again noted. There are chronic Schmorl's nodes within the superior endplates of T12, L1, and L2, as before. At L5, the metastatic disease extends into the left pedicle and articular facets. SPINAL CORD: The conus terminates normally. SOFT TISSUES: No paraspinal mass. No postoperative fluid collection present to suggest infection or abscess. There is ascites present. Small stones are seen laying independently within the gallbladder, which is mildly distended. There is a cyst arising laterally from the lower pole of the right kidney. T12-L1: Chronic Schmorl's nodes are present within the superior endplate of T12. No significant disc herniation. No spinal canal stenosis or neural foraminal narrowing. L1-L2: Chronic Schmorl's nodes are present within the superior endplate of L1. No significant disc herniation.  No spinal canal stenosis or neural foraminal narrowing. L2-L3: Chronic Schmorl's nodes are present within the superior endplate of L2. No significant disc herniation. No spinal canal stenosis or neural foraminal narrowing. L3-L4: No significant disc herniation. No spinal canal stenosis or neural foraminal narrowing. L4-L5: No significant disc herniation. No spinal canal stenosis or neural foraminal narrowing. L5-S1: No significant disc herniation. Metastatic disease within the left neural foramen at L5-S1 encasing the left L5 nerve. No spinal canal stenosis. Neural foraminal narrowing on the left due to metastatic disease. IMPRESSION: 1. Enhancing metastatic lesions in the L3, L4, and L5 vertebrae, with associated mild-to-moderate compression fracture of L3 and pathological compression fracture of L5 with retropulsion and posterior bowing of the posterior wall. Bilateral vertebral augmentation at L5 with appropriately situated methylmethacrylate. Metastatic disease extends into the left pedicle and articular facets at L5, as well as the left neural foramen at L5-S1, encasing the left L5 nerve. 2. No postoperative fluid collection to suggest infection or abscess. Electronically  signed by: Evalene Coho MD 06/02/2024 05:35 AM EST RP Workstation: [id number]   IR KYPHO LUMBAR INC FX REDUCE BONE BX UNI/BIL CANNULATION INC/IMAGING Result Date: 05/31/2024 CLINICAL DATA:  68 year old female with history of bladder cancer and suspected pathologic fracture of the L5 vertebral body. EXAM: FLUOROSCOPIC GUIDED L5 VERTEBRAL BODY bone biopsy, RF ABLATION, AND KYPHOPLASTY/CEMENT AUGMENTATION. COMPARISON:  05/13/2024 MEDICATIONS: Toradol  30 mg IV; Vancomycin  1 gm IV; The antibiotic was administered in an appropriate time interval prior to needle puncture of the skin. ANESTHESIA/SEDATION: Moderate (conscious) sedation was employed during this procedure. A total of Versed  2 mg and Fentanyl  200 mcg was administered  intravenously. Moderate Sedation Time: 37 minutes. The patient's level of consciousness and vital signs were monitored continuously by radiology nursing throughout the procedure under my direct supervision. FLUOROSCOPY TIME:  Fluoroscopy Time: 12 minutes 42 seconds (1203 mGy). COMPLICATIONS: None immediate. TECHNIQUE: Informed written consent was obtained from the patient after a thorough discussion of the procedural risks, benefits and alternatives. All questions were addressed. Maximal Sterile Barrier Technique was utilized including caps, mask, sterile gowns, sterile gloves, sterile drape, hand hygiene and skin antiseptic. A timeout was performed prior to the initiation of the procedure. The patient was placed prone on the fluoroscopic table. The skin overlying the lumbar region was then prepped and draped in the usual sterile fashion. Maximal barrier sterile technique was utilized including caps, mask, sterile gowns, sterile gloves, sterile drape, hand hygiene and skin antiseptic. Intravenous Fentanyl  and Versed  were administered as conscious sedation during continuous cardiorespiratory monitoring by the radiology RN. The left pedicle at L5 was then infiltrated with 1% lidocaine  followed by the advancement of a Kyphon trocar needle through the left pedicle into the posterior one-third of the vertebral body. Bone biopsy was obtained. Subsequently, the osteo drill was advanced to the anterior third of the vertebral body. The osteo drill was retracted. Through the working cannula, a 10 mm Osetocool RF ablation probe was inserted and positioned under fluoroscopic guidance. In similar fashion, the right L5 pedicle was infiltrated with 1% lidocaine . Utilizing a extra pedicular approach, a second Kyphon trocar needle was advanced into the posterior third of the vertebral body. Subsequently, the osteo drill was coaxially advanced to the anterior right third. The osteo drill was exchanged for a 10 mm Oseto cool RF  ablation probe which was positioned under fluoroscopic guidance. With both Osteocool ablation probes in place, the ablation was performed for 6 minutes. Attention was now paid towards the kyphoplasty portion of the procedure. Beginning at the L5 vertebral body level, a Kyphon inflatable bone tamp 15 x 3 was advanced through both working cannulas and positioned with the distal marker approximately 5 mm from the anterior aspect of the cortex. Appropriate positioning was confirmed on the AP projection. At this time, the balloon was expanded using contrast via a Kyphon inflation syringe device via micro tubing. Inflations were continued under direct fluoroscopic guidance. At this time, methylmethacrylate mixture was reconstituted in the Kyphon bone mixing device system. This was then loaded into the delivery mechanism, attached to Kyphon bone fillers. The balloons were deflated and removed followed by the instillation of methylmethacrylate mixture with excellent filling in the AP and lateral projections. The working cannulae and the bone filler were then retrieved and removed. Multiple spot radiographic images were obtained in various obliquities. Hemostasis was achieved with manual compression. The patient tolerated the procedure well without immediate postprocedural complication. FINDINGS: Completion images demonstrate a technically excellent result with adequate cement filling of  the L5 vertebral bodies on both the AP and lateral projections. No extravasation was noted in the disk spaces or posteriorly into the spinal canal, though cement was noted projecting along the posterior margin of the ablation zone via the right extra pedicular access. No epidural venous contamination was seen. IMPRESSION: Technically successful L5 vertebral body bone biopsy, radiofrequency ablation, and cement augmentation using balloon kyphoplasty. PLAN: The patient will be seen for clinical follow-up at the interventional radiology clinic  in 2-4 weeks. Ester Sides, MD [facility] Radiology Electronically Signed   By: Ester Sides M.D.   On: 05/31/2024 13:22   IR Bone Tumor(s)RF Ablation Result Date: 05/31/2024 CLINICAL DATA:  68 year old female with history of bladder cancer and suspected pathologic fracture of the L5 vertebral body. EXAM: FLUOROSCOPIC GUIDED L5 VERTEBRAL BODY bone biopsy, RF ABLATION, AND KYPHOPLASTY/CEMENT AUGMENTATION. COMPARISON:  05/13/2024 MEDICATIONS: Toradol  30 mg IV; Vancomycin  1 gm IV; The antibiotic was administered in an appropriate time interval prior to needle puncture of the skin. ANESTHESIA/SEDATION: Moderate (conscious) sedation was employed during this procedure. A total of Versed  2 mg and Fentanyl  200 mcg was administered intravenously. Moderate Sedation Time: 37 minutes. The patient's level of consciousness and vital signs were monitored continuously by radiology nursing throughout the procedure under my direct supervision. FLUOROSCOPY TIME:  Fluoroscopy Time: 12 minutes 42 seconds (1203 mGy). COMPLICATIONS: None immediate. TECHNIQUE: Informed written consent was obtained from the patient after a thorough discussion of the procedural risks, benefits and alternatives. All questions were addressed. Maximal Sterile Barrier Technique was utilized including caps, mask, sterile gowns, sterile gloves, sterile drape, hand hygiene and skin antiseptic. A timeout was performed prior to the initiation of the procedure. The patient was placed prone on the fluoroscopic table. The skin overlying the lumbar region was then prepped and draped in the usual sterile fashion. Maximal barrier sterile technique was utilized including caps, mask, sterile gowns, sterile gloves, sterile drape, hand hygiene and skin antiseptic. Intravenous Fentanyl  and Versed  were administered as conscious sedation during continuous cardiorespiratory monitoring by the radiology RN. The left pedicle at  L5 was then infiltrated with 1% lidocaine  followed by the advancement of a Kyphon trocar needle through the left pedicle into the posterior one-third of the vertebral body. Bone biopsy was obtained. Subsequently, the osteo drill was advanced to the anterior third of the vertebral body. The osteo drill was retracted. Through the working cannula, a 10 mm Osetocool RF ablation probe was inserted and positioned under fluoroscopic guidance. In similar fashion, the right L5 pedicle was infiltrated with 1% lidocaine . Utilizing a extra pedicular approach, a second Kyphon trocar needle was advanced into the posterior third of the vertebral body. Subsequently, the osteo drill was coaxially advanced to the anterior right third. The osteo drill was exchanged for a 10 mm Oseto cool RF ablation probe which was positioned under fluoroscopic guidance. With both Osteocool ablation probes in place, the ablation was performed for 6 minutes. Attention was now paid towards the kyphoplasty portion of the procedure. Beginning at the L5 vertebral body level, a Kyphon inflatable bone tamp 15 x 3 was advanced through both working cannulas and positioned with the distal marker approximately 5 mm from the anterior aspect of the cortex. Appropriate positioning was confirmed on the AP projection. At this time, the balloon was expanded using contrast via a Kyphon inflation syringe device via micro tubing. Inflations were continued under direct fluoroscopic guidance. At this time, methylmethacrylate mixture was reconstituted in the Kyphon bone  mixing device system. This was then loaded into the delivery mechanism, attached to Kyphon bone fillers. The balloons were deflated and removed followed by the instillation of methylmethacrylate mixture with excellent filling in the AP and lateral projections. The working cannulae and the bone filler were then retrieved and removed. Multiple spot radiographic images were obtained in various obliquities.  Hemostasis was achieved with manual compression. The patient tolerated the procedure well without immediate postprocedural complication. FINDINGS: Completion images demonstrate a technically excellent result with adequate cement filling of the L5 vertebral bodies on both the AP and lateral projections. No extravasation was noted in the disk spaces or posteriorly into the spinal canal, though cement was noted projecting along the posterior margin of the ablation zone via the right extra pedicular access. No epidural venous contamination was seen. IMPRESSION: Technically successful L5 vertebral body bone biopsy, radiofrequency ablation, and cement augmentation using balloon kyphoplasty. PLAN: The patient will be seen for clinical follow-up at the interventional radiology clinic in 2-4 weeks. Ester Sides, MD [facility] Radiology Electronically Signed   By: Ester Sides M.D.   On: 05/31/2024 13:22     Procedures   Medications Ordered in the ED - No data to display                                  Medical Decision Making  Patient returns today to see her surgeon Dr. Sides. Chart reviewed, patient had MRI yesterday evening, plan was for her to follow-up in clinic today. Discussed patient with Dr. Sides who confirms that she was supposed to go to the clinic this morning and not the emergency department. Discussed with patient who confirms she is here to see her surgeon, has no new complaints to warrant additional work-up in the ER at this time. Dr. Sides reports patient can go up to IR for clinic at this time. Nursing staff called and patient accepted, transport provided by ER staff via wheelchair. Evaluation and diagnostic testing in the emergency department does not suggest an emergent condition requiring admission or immediate intervention beyond what has been performed at this time.  Plan for discharge with close PCP follow-up.  Patient is  understanding and amenable with plan, educated on red flag symptoms that would prompt immediate return.  Patient discharged in stable condition.  Final diagnoses:  Status post kyphoplasty    ED Discharge Orders     None          Nora Lauraine LABOR, PA-C 06/02/24 0900    Cherita, Hebel, DO 06/02/24 1414

## 2024-06-02 NOTE — ED Notes (Signed)
 Pt returned from MRI

## 2024-06-02 NOTE — ED Provider Notes (Signed)
 I assumed care upon transfer from [facility].  Patient with recent interventional radiology procedure.  Now having oozing from her back.  Appears serous.  Case was discussed with interventional radiology who recommended MRI.  It was not available at [facility] long and so she was transferred here for imaging.  She tells me that she is having some pain in her back mostly along the left side.  She denies loss of bowel or bladder denies loss of peritoneal sensation denies numbness to the legs.  Pulse motor and sensation intact to bilateral lower extremities.  I discussed case with Dr. Jennefer, he recommended having the patient come to [facility] today and he will see in clinic.  Family called me back into the room after they were up for discharge.  There does seem to be some pitting edema in the soft tissue of the patient's back.  She has some right lower extremity edema which family thinks is new but the patient later states this been coming and going.     Emil Share, DO 06/02/24 [phone]

## 2024-06-02 NOTE — ED Notes (Signed)
 Pt arrived from [facility] zero noted distress. A and O x 4.pain 8/10 back.

## 2024-06-02 NOTE — Discharge Instructions (Signed)
 Follow up with your IR doc.

## 2024-06-02 NOTE — Progress Notes (Signed)
 Raven Lopez was seen in IR at [facility].  She presented to the ED last night due to continued fluid leakage from her incision sites after kyphoplasty/osteocool on 05/31/24, at the behest of her son and daughter-in-law who again accompany her.  She had an MRI lumbar spine at [facility] overnight which was unremarkable without evidence of fluid collection, definite CSF fistulization, or post-kyphoplasty complication.  She remains afebrile without new lower extremity weakness.    She again is frail with right lower extremity pitting edema.  Her lower back also demonstrates pitting edema.  No fevers or chills.  Vital signs within normal limits.  There was trace serous-appearing fluid leaking from both incision sites.    I sterilized her sites then covered each with dermabond.  These were covered with bandaids.    She was discharged home.  Plan to follow up in 2-3 weeks in IR clinic for routine post-kyphoplasty follow up.  Ester Sides, MD Pager: [phone]

## 2024-06-02 NOTE — Progress Notes (Signed)
 Please see telephone note

## 2024-06-02 NOTE — ED Triage Notes (Signed)
 Pt arrived POV c/o leakage from surgical site to lower back. Had procedure 2 days ago, was seen last night for same complaint and sent to [place] for MRI. Pt was directed by surgeon to come back to ED so he could evaluate her.

## 2024-06-08 ENCOUNTER — Encounter: Payer: Self-pay | Admitting: *Deleted

## 2024-06-08 ENCOUNTER — Telehealth: Payer: Self-pay | Admitting: *Deleted

## 2024-06-08 NOTE — Patient Outreach (Signed)
 Call placed for scheduled outreach with patient son this morning. Patient son reported that patient passed away 06-Jul-2024.  Rosina Forte, BSN RN [facility], [facility] RN Care Manager Direct Dial: [phone]  Fax: [phone]

## 2024-06-23 ENCOUNTER — Other Ambulatory Visit

## 2024-06-23 ENCOUNTER — Other Ambulatory Visit: Admitting: Urology

## 2024-06-25 ENCOUNTER — Inpatient Hospital Stay: Attending: Hematology | Admitting: Oncology

## 2024-07-08 DEATH — deceased
